# Patient Record
Sex: Male | Born: 1955 | Race: White | Hispanic: No | Marital: Married | Smoking: Former smoker
Health system: Southern US, Community
[De-identification: ages and names within clinical notes are randomized; demographics above are authoritative.]

## PROBLEM LIST (undated history)

## (undated) DIAGNOSIS — N400 Enlarged prostate without lower urinary tract symptoms: Secondary | ICD-10-CM

## (undated) DIAGNOSIS — T8859XA Other complications of anesthesia, initial encounter: Secondary | ICD-10-CM

## (undated) DIAGNOSIS — J449 Chronic obstructive pulmonary disease, unspecified: Secondary | ICD-10-CM

## (undated) DIAGNOSIS — I10 Essential (primary) hypertension: Secondary | ICD-10-CM

## (undated) DIAGNOSIS — N289 Disorder of kidney and ureter, unspecified: Secondary | ICD-10-CM

## (undated) DIAGNOSIS — R911 Solitary pulmonary nodule: Secondary | ICD-10-CM

## (undated) DIAGNOSIS — E785 Hyperlipidemia, unspecified: Secondary | ICD-10-CM

## (undated) DIAGNOSIS — I639 Cerebral infarction, unspecified: Secondary | ICD-10-CM

## (undated) HISTORY — PX: OTHER SURGICAL HISTORY: SHX169

## (undated) HISTORY — DX: Hyperlipidemia, unspecified: E78.5

## (undated) HISTORY — DX: Chronic obstructive pulmonary disease, unspecified: J44.9

## (undated) HISTORY — PX: NO PAST SURGERIES: SHX2092

## (undated) HISTORY — DX: Solitary pulmonary nodule: R91.1

## (undated) HISTORY — DX: Benign prostatic hyperplasia without lower urinary tract symptoms: N40.0

## (undated) HISTORY — PX: CERVICAL FUSION: SHX112

---

## 2009-05-07 ENCOUNTER — Encounter (INDEPENDENT_AMBULATORY_CARE_PROVIDER_SITE_OTHER): Payer: Self-pay | Admitting: *Deleted

## 2010-12-02 NOTE — Letter (Signed)
Summary: Pre Visit No Show Letter  [facility]  [address] [place], [place] [address]   Phone: [phone]  Fax: [phone]        May 07, 2009 MRN: [id number]    ARZELL MCGEEHAN [address] [place], [place]  [address]    Dear Mr. OATIS,   We have been unable to reach you by phone concerning the pre-procedure visit that you missed on 05-07-2009. For this reason,your procedure scheduled on 05-20-2009 has been cancelled. Our scheduling staff will gladly assist you with rescheduling your appointments at a more convenient time. Please call our office at [phone] between the hours of 8:00am and 5:00pm, press option #2 to reach an appointment scheduler. Please consider updating your contact numbers at this time so that we can reach you by phone in the future with schedule changes or results.    Thank you,    Ezra Sites RN [facility]

## 2011-11-26 ENCOUNTER — Other Ambulatory Visit: Payer: Self-pay | Admitting: Family Medicine

## 2011-11-26 DIAGNOSIS — R053 Chronic cough: Secondary | ICD-10-CM

## 2011-11-26 DIAGNOSIS — R05 Cough: Secondary | ICD-10-CM

## 2011-11-30 ENCOUNTER — Ambulatory Visit
Admission: RE | Admit: 2011-11-30 | Discharge: 2011-11-30 | Disposition: A | Discharge status: Home / Self Care | Payer: BC Managed Care – PPO | Source: Ambulatory Visit | Attending: Family Medicine | Admitting: Family Medicine

## 2011-11-30 DIAGNOSIS — R053 Chronic cough: Secondary | ICD-10-CM

## 2011-11-30 DIAGNOSIS — R05 Cough: Secondary | ICD-10-CM

## 2011-12-08 ENCOUNTER — Encounter: Payer: Self-pay | Admitting: Internal Medicine

## 2011-12-08 ENCOUNTER — Ambulatory Visit (INDEPENDENT_AMBULATORY_CARE_PROVIDER_SITE_OTHER): Payer: BC Managed Care – PPO | Admitting: Internal Medicine

## 2011-12-08 DIAGNOSIS — F17200 Nicotine dependence, unspecified, uncomplicated: Secondary | ICD-10-CM | POA: Insufficient documentation

## 2011-12-08 DIAGNOSIS — J449 Chronic obstructive pulmonary disease, unspecified: Secondary | ICD-10-CM | POA: Insufficient documentation

## 2011-12-08 DIAGNOSIS — R918 Other nonspecific abnormal finding of lung field: Secondary | ICD-10-CM

## 2011-12-08 NOTE — Patient Instructions (Signed)
We will need to get you scheduled for PFT's in [place] if at all possible before you go to [place]  You will need a repeat CT chest in 6 months to follow up the nodules unless new symptoms like new chest pain, coughing up blood or short of breath in which cxr needs to be done  Work on perfecting  inhaler technique:  relax and gently blow all the way out then take a nice smooth deep breath back in, triggering the inhaler at same time you start breathing in.  Hold for up to 5 seconds if you can.  Rinse and gargle with water when done   If your mouth or throat starts to bother you,   I suggest you time the inhaler to your dental care and after using the inhaler(s) brush teeth and tongue with a baking soda containing toothpaste and when you rinse this out, gargle with it first to see if this helps your mouth and throat.     You need to stop smoking before smoking stops you - remember the Fletcher curve.

## 2011-12-08 NOTE — Assessment & Plan Note (Addendum)
>   3 min discussion  I reviewed the Flethcher curve with patient that basically indicates  if you quit smoking when your best day FEV1 is still well preserved (which his appears to be) it is highly unlikely you will progress to severe disease and informed the patient there was no medication on the market that has proven to change the curve or the likelihood of progression.  Therefore stopping smoking and maintaining abstinence is the most important aspect of care, not choice of inhalers or for that matter, doctors.   

## 2011-12-08 NOTE — Assessment & Plan Note (Signed)
Clinically relatively mild with a significant ab component suggested by response to dulera  The proper method of use, as well as anticipated side effects, of this metered-dose inhaler are discussed and demonstrated to the patient. Improved to 75% with coaching.  Needs pft's  Smoking cessation discussed separately

## 2011-12-08 NOTE — Assessment & Plan Note (Signed)
Although there are clearly abnormalities on CT scan, they should probably be considered "microscopic" since not obvious on plain cxr .     In the setting of obvious "macroscopic" health issues,  I am very reluctatnt to embark on an invasive w/u at this point but will arrange consevative  follow up and in the meantime see what we can do to address the patient's subjective concerns.    Discussed in detail all the  indications, usual  risks and alternatives  relative to the benefits with patient and wife who understands we can't be 100% sure these are all benign with removing them all now, which is not feasible.  Rec f/u CT as per guidelines unless new symptoms or macroscopic change on plain cxr in interim

## 2011-12-08 NOTE — Progress Notes (Signed)
  Subjective:    Patient ID: Daniel [name], male    DOB: Feb 17, 1956  MRN: [id number]  HPI  56 yowm active smoker with new cough in late 2012 referred by Dr Christell Constant to the pulmonary [facility] pulmonary office for abn ct  12/08/2011 1st pulmonary eval with h/o asthma as child and felt outgrew it after age 56 but noted onset about 2 years prior to OV  cc sob "like asthma" requiring some use saba but not daily then increase cough and sob x 3-6 months > productive of clear mucus slt yellow or green but mostly clear and better since started dulera though not using consistently at all.  No hemoptysis. Doe x heavy exertion only, not with adls.   Sleeping ok without nocturnal  or early am exacerbation  of respiratory  c/o's or need for noct saba. Also denies any obvious fluctuation of symptoms with weather or environmental changes or other aggravating or alleviating factors except as outlined above         Fm Hx Pos Lung Ca mother  Review of Systems  Constitutional: Negative for fever and unexpected weight change.  HENT: Positive for sore throat and dental problem. Negative for ear pain, nosebleeds, congestion, rhinorrhea, sneezing, trouble swallowing, postnasal drip and sinus pressure.   Eyes: Negative for redness and itching.  Respiratory: Positive for cough and shortness of breath. Negative for chest tightness and wheezing.   Cardiovascular: Negative for palpitations and leg swelling.  Gastrointestinal: Negative for nausea and vomiting.  Genitourinary: Negative for dysuria.  Musculoskeletal: Positive for joint swelling.  Skin: Negative for rash.  Neurological: Negative for headaches.  Hematological: Does not bruise/bleed easily.  Psychiatric/Behavioral: Negative for dysphoric mood. The patient is not nervous/anxious.        Objective:   Physical Exam 12/08/2011 180  HEENT mild turbinate edema.  Oropharynx no thrush or excess pnd or cobblestoning.  No JVD or cervical adenopathy. Mild  accessory muscle hypertrophy. Trachea midline, nl thryroid. Chest was hyperinflated by percussion with diminished breath sounds and mild increased exp time without wheeze. Hoover sign positive at end inspiration. Regular rate and rhythm without murmur gallop or rub or increase P2 or edema.  Abd: no hsm, nl excursion. Ext warm without cyanosis or clubbing.     Ct chest 11/30/11 Numerous small pulmonary nodules scattered throughout the lungs  bilaterally, largest of which is 7 mm in the right upper lobe, as  above.        Assessment & Plan:

## 2011-12-11 ENCOUNTER — Ambulatory Visit (INDEPENDENT_AMBULATORY_CARE_PROVIDER_SITE_OTHER): Payer: BC Managed Care – PPO | Admitting: Physician Assistant

## 2011-12-11 DIAGNOSIS — Z0000 Encounter for general adult medical examination without abnormal findings: Secondary | ICD-10-CM

## 2011-12-11 NOTE — Progress Notes (Signed)
   Daniel [name] is a 56 y.o. male smoker with no h/o DM2 or HTN or FHx CAD who is going to [place] for 2 years.  Seen by PCP and wanted him to have a ETT prior to going.  Exercise Treadmill Test  Pre-Exercise Testing Evaluation Rhythm: normal sinus  Rate: 66   PR:  .11 QRS:  .08  QT:  .39 QTc: .41     Test  Exercise Tolerance Test Ordering MD: Rudi Heap MD  Interpreting MD:  Tereso Newcomer PA-C  Unique Test No: 1  Treadmill:  1  Indication for ETT: Physical  Contraindication to ETT: No   Stress Modality: exercise - treadmill  Cardiac Imaging Performed: non   Protocol: standard Bruce - maximal  Max BP:  165/84  Max MPHR (bpm):  165 85% MPR (bpm):  140  MPHR obtained (bpm):  162 % MPHR obtained:  97%  Reached 85% MPHR (min:sec):  6:25 Total Exercise Time (min-sec):  7:43  Workload in METS:  9.6 Borg Scale: 15  Reason ETT Terminated:  patient's desire to stop    ST Segment Analysis At Rest: normal ST segments - no evidence of significant ST depression With Exercise: no evidence of significant ST depression  Other Information Arrhythmia:  No Angina during ETT:  absent (0) Quality of ETT:  diagnostic  ETT Interpretation:  normal - no evidence of ischemia by ST analysis  Comments: Fair exercise tolerance. No chest pain. Normal BP response to exercise. No ST-T changes to suggest ischemia.   Recommendations: Follow up with PCP as directed. Tereso Newcomer, PA-C  11:46 AM 12/11/2011

## 2012-01-26 ENCOUNTER — Encounter: Payer: Self-pay | Admitting: Internal Medicine

## 2012-01-29 ENCOUNTER — Encounter: Payer: Self-pay | Admitting: Internal Medicine

## 2012-06-21 ENCOUNTER — Telehealth: Payer: Self-pay | Admitting: *Deleted

## 2012-06-21 DIAGNOSIS — R918 Other nonspecific abnormal finding of lung field: Secondary | ICD-10-CM

## 2012-06-21 NOTE — Telephone Encounter (Signed)
Message copied by Christen Butter on Tue Jun 21, 2012 12:12 PM ------      Message from: Nyoka Cowden      Created: Tue Dec 08, 2011  9:44 PM       Needs repeat ct chest this month

## 2012-06-21 NOTE — Telephone Encounter (Signed)
Needs ct chest this month LMTCB

## 2012-06-23 NOTE — Telephone Encounter (Signed)
Spoke with pt's spouse and she is aware I am sending order to [facility] to schedule ct chest. She denied any questions/concerns. I advised will call the pt with results once reviewed.

## 2012-06-27 ENCOUNTER — Encounter: Payer: Self-pay | Admitting: Internal Medicine

## 2012-06-27 ENCOUNTER — Ambulatory Visit (INDEPENDENT_AMBULATORY_CARE_PROVIDER_SITE_OTHER)
Admission: RE | Admit: 2012-06-27 | Discharge: 2012-06-27 | Disposition: A | Discharge status: Home / Self Care | Payer: BC Managed Care – PPO | Source: Ambulatory Visit | Attending: Internal Medicine | Admitting: Internal Medicine

## 2012-06-27 DIAGNOSIS — R918 Other nonspecific abnormal finding of lung field: Secondary | ICD-10-CM

## 2012-06-28 ENCOUNTER — Telehealth: Payer: Self-pay | Admitting: Internal Medicine

## 2012-06-28 NOTE — Telephone Encounter (Signed)
Notes Recorded by Nyoka Cowden, MD on 06/27/2012 at 4:20 PM Call patient : Study is unremarkable, no change size of nodules, radiology rec one more study one year.  I spoke with patient about results and he verbalized understanding and had no questions

## 2013-06-06 ENCOUNTER — Telehealth: Payer: Self-pay | Admitting: *Deleted

## 2013-06-06 NOTE — Telephone Encounter (Signed)
Message copied by Christen Butter on Tue Jun 06, 2013 11:22 AM ------      Message from: Sandrea Hughs B      Created: Mon Jun 27, 2012  4:20 PM       Needs repeat ct this month ------

## 2013-06-06 NOTE — Telephone Encounter (Signed)
LMTCB

## 2013-06-09 NOTE — Telephone Encounter (Signed)
Just re-enter it for recall 08/02/13

## 2013-06-09 NOTE — Telephone Encounter (Signed)
Spoke with the pt's spouse She states that the pt is currently in [place] and he is probably will not be home until after Sept 4th I asked if we could go ahead and get this set up for late Sept, but she prefers to wait until the pt returns Will forward to MW so that she is aware

## 2013-06-12 NOTE — Telephone Encounter (Signed)
Done-reminder was sent

## 2013-07-18 ENCOUNTER — Other Ambulatory Visit: Payer: Self-pay | Admitting: *Deleted

## 2013-07-18 ENCOUNTER — Other Ambulatory Visit (INDEPENDENT_AMBULATORY_CARE_PROVIDER_SITE_OTHER): Payer: BC Managed Care – PPO

## 2013-07-18 DIAGNOSIS — E785 Hyperlipidemia, unspecified: Secondary | ICD-10-CM

## 2013-07-18 DIAGNOSIS — R5383 Other fatigue: Secondary | ICD-10-CM

## 2013-07-18 DIAGNOSIS — E559 Vitamin D deficiency, unspecified: Secondary | ICD-10-CM

## 2013-07-18 DIAGNOSIS — R5381 Other malaise: Secondary | ICD-10-CM

## 2013-07-18 DIAGNOSIS — N400 Enlarged prostate without lower urinary tract symptoms: Secondary | ICD-10-CM

## 2013-07-18 DIAGNOSIS — R918 Other nonspecific abnormal finding of lung field: Secondary | ICD-10-CM

## 2013-07-18 LAB — POCT CBC
Granulocyte percent: 71.8 %G (ref 37–80)
HCT, POC: 48.4 % (ref 43.5–53.7)
Hemoglobin: 16 g/dL (ref 14.1–18.1)
Lymph, poc: 1.6 (ref 0.6–3.4)
MCH, POC: 31.9 pg — AB (ref 27–31.2)
MCHC: 33.2 g/dL (ref 31.8–35.4)
MCV: 96.1 fL (ref 80–97)
MPV: 10.6 fL (ref 0–99.8)
POC Granulocyte: 4.5 (ref 2–6.9)
POC LYMPH PERCENT: 26.3 %L (ref 10–50)
Platelet Count, POC: 133 10*3/uL — AB (ref 142–424)
RBC: 5 M/uL (ref 4.69–6.13)
RDW, POC: 13.6 %
WBC: 6.2 10*3/uL (ref 4.6–10.2)

## 2013-07-18 LAB — POCT URINALYSIS DIPSTICK
Bilirubin, UA: NEGATIVE
Blood, UA: NEGATIVE
Glucose, UA: NEGATIVE
Ketones, UA: NEGATIVE
Leukocytes, UA: NEGATIVE
Nitrite, UA: NEGATIVE
Protein, UA: NEGATIVE
Spec Grav, UA: 1.015
Urobilinogen, UA: NEGATIVE
pH, UA: 5

## 2013-07-18 LAB — POCT UA - MICROSCOPIC ONLY
Bacteria, U Microscopic: NEGATIVE
Casts, Ur, LPF, POC: NEGATIVE
Crystals, Ur, HPF, POC: NEGATIVE
Mucus, UA: NEGATIVE
RBC, urine, microscopic: NEGATIVE
WBC, Ur, HPF, POC: NEGATIVE
Yeast, UA: NEGATIVE

## 2013-07-18 NOTE — Progress Notes (Signed)
Pt came in for labs only 

## 2013-07-20 ENCOUNTER — Ambulatory Visit (INDEPENDENT_AMBULATORY_CARE_PROVIDER_SITE_OTHER): Payer: BC Managed Care – PPO | Admitting: Family Medicine

## 2013-07-20 ENCOUNTER — Encounter: Payer: Self-pay | Admitting: Family Medicine

## 2013-07-20 VITALS — BP 98/64 | HR 71 | Temp 98.4°F | Ht 70.5 in | Wt 154.0 lb

## 2013-07-20 DIAGNOSIS — J449 Chronic obstructive pulmonary disease, unspecified: Secondary | ICD-10-CM

## 2013-07-20 DIAGNOSIS — N400 Enlarged prostate without lower urinary tract symptoms: Secondary | ICD-10-CM

## 2013-07-20 DIAGNOSIS — E785 Hyperlipidemia, unspecified: Secondary | ICD-10-CM

## 2013-07-20 DIAGNOSIS — R9389 Abnormal findings on diagnostic imaging of other specified body structures: Secondary | ICD-10-CM

## 2013-07-20 LAB — NMR, LIPOPROFILE
Cholesterol: 213 mg/dL — ABNORMAL HIGH (ref <200)
HDL Cholesterol by NMR: 65 mg/dL (ref >=40)
HDL Particle Number: 38.2 umol/L (ref >=30.5)
LDL Particle Number: 1886 nmol/L — ABNORMAL HIGH (ref <1000)
LDL Size: 20.9 nm (ref >20.5)
LDLC SERPL CALC-MCNC: 133 mg/dL — ABNORMAL HIGH (ref <100)
LP-IR Score: <25 (ref <=45)
Small LDL Particle Number: 727 nmol/L — ABNORMAL HIGH (ref <=527)
Triglycerides by NMR: 74 mg/dL (ref <150)

## 2013-07-20 LAB — THYROID PANEL WITH TSH
Free Thyroxine Index: 2 (ref 1.2–4.9)
T3 Uptake Ratio: 28 % (ref 24–39)
T4, Total: 7.1 ug/dL (ref 4.5–12.0)
TSH: 3.73 u[IU]/mL (ref 0.450–4.500)

## 2013-07-20 LAB — BMP8+EGFR
BUN/Creatinine Ratio: 14 (ref 9–20)
BUN: 13 mg/dL (ref 6–24)
CO2: 24 mmol/L (ref 18–29)
Calcium: 9 mg/dL (ref 8.7–10.2)
Chloride: 100 mmol/L (ref 97–108)
Creatinine, Ser: 0.93 mg/dL (ref 0.76–1.27)
GFR calc Af Amer: 105 mL/min/{1.73_m2} (ref >59)
GFR calc non Af Amer: 91 mL/min/{1.73_m2} (ref >59)
Glucose: 95 mg/dL (ref 65–99)
Potassium: 4.7 mmol/L (ref 3.5–5.2)
Sodium: 140 mmol/L (ref 134–144)

## 2013-07-20 LAB — HEPATIC FUNCTION PANEL
ALT: 14 IU/L (ref 0–44)
AST: 19 IU/L (ref 0–40)
Albumin: 4.5 g/dL (ref 3.5–5.5)
Alkaline Phosphatase: 72 IU/L (ref 39–117)
Bilirubin, Direct: 0.16 mg/dL (ref 0.00–0.40)
Total Bilirubin: 0.7 mg/dL (ref 0.0–1.2)
Total Protein: 6.6 g/dL (ref 6.0–8.5)

## 2013-07-20 LAB — VITAMIN D 25 HYDROXY (VIT D DEFICIENCY, FRACTURES): Vit D, 25-Hydroxy: 33.5 ng/mL (ref 30.0–100.0)

## 2013-07-20 LAB — PSA, TOTAL AND FREE
PSA, Free Pct: 40 %
PSA, Free: 0.08 ng/mL
PSA: 0.2 ng/mL (ref 0.0–4.0)

## 2013-07-20 NOTE — Patient Instructions (Addendum)
Continue current medications. Continue good therapeutic lifestyle changes.  Fall precautions discussed with patient.  Schedule your flu vaccine the first of October.  Follow up as planned and earlier as needed.  Take vitamin D regularly  Get CT scan as planned end of October or the first of November

## 2013-07-20 NOTE — Progress Notes (Signed)
Subjective:    Patient ID: Daniel [name], male    DOB: July 31, 1956, 57 y.o.   MRN: [id number]  HPI PT HERE TODAY FOR MALE CPE The patient had recent lab work and everything was actually pretty good on the lab work except for his total LDL particle number being elevated at 1886. His vitamin D level was at the low end of the normal range. The patient has a history of an abnormal chest CT and this is supposed to be repeated sometime toward the end of October .    Patient Active Problem List   Diagnosis Date Noted  . COPD (chronic obstructive pulmonary disease) 12/08/2011  . Smoker 12/08/2011  . Multiple pulmonary nodules 12/08/2011   Outpatient Encounter Prescriptions as of 07/20/2013  Medication Sig Dispense Refill  . Mometasone Furo-Formoterol Fum (DULERA) 200-5 MCG/ACT AERO Inhale 2 puffs into the lungs 2 (two) times daily.      . Vitamin D, Ergocalciferol, (DRISDOL) 50000 UNITS CAPS Take 1 tablet by mouth Once a week.      . [DISCONTINUED] sulfamethoxazole-trimethoprim (BACTRIM DS) 800-160 MG per tablet Take 1 tablet by mouth Twice daily.       No facility-administered encounter medications on file as of 07/20/2013.       Review of Systems  Constitutional: Negative.   HENT: Negative.   Eyes: Negative.   Respiratory: Negative.   Cardiovascular: Negative.   Gastrointestinal: Negative.   Endocrine: Negative.   Genitourinary: Negative.   Musculoskeletal: Positive for myalgias (CRAMPS- WORSE AT NIGHT) and arthralgias (LEFT KNEE PAIN).  Skin: Negative.   Allergic/Immunologic: Negative.   Neurological: Positive for dizziness.  Hematological: Negative.   Psychiatric/Behavioral: Negative.        Objective:   Physical Exam  Nursing note and vitals reviewed. Constitutional: He is oriented to person, place, and time. He appears well-developed and well-nourished. No distress.  HENT:  Head: Normocephalic and atraumatic.  Right Ear: External ear normal.  Left Ear: External  ear normal.  Nose: Nose normal.  Mouth/Throat: Oropharynx is clear and moist. No oropharyngeal exudate.  Eyes: Conjunctivae and EOM are normal. Pupils are equal, round, and reactive to light. Right eye exhibits no discharge. Left eye exhibits no discharge. No scleral icterus.  Neck: Normal range of motion. Neck supple. No thyromegaly present.  Cardiovascular: Normal rate, regular rhythm, normal heart sounds and intact distal pulses.   No murmur heard. At 72 per minute  Pulmonary/Chest: Effort normal and breath sounds normal. No respiratory distress. He has no wheezes. He has no rales.  Dry cough is getting better  Abdominal: Soft. Bowel sounds are normal. He exhibits no mass. There is no tenderness. There is no rebound and no guarding.  Genitourinary: Rectum normal, prostate normal and penis normal.  Ostitis slightly enlarged but no rubs. There are no rectal masses. There is no inguinal hernia bilaterally.  Musculoskeletal: Normal range of motion. He exhibits no edema and no tenderness.  Lymphadenopathy:    He has no cervical adenopathy.  Neurological: He is alert and oriented to person, place, and time. He has normal reflexes.  Skin: Skin is warm and dry. No rash noted. He is not diaphoretic. No erythema. No pallor.  Psychiatric: He has a normal mood and affect. His behavior is normal. Judgment and thought content normal.   BP 98/64  Pulse 71  Temp(Src) 98.4 F (36.9 C) (Oral)  Ht 5' 10.5" (1.791 m)  Wt 154 lb (69.854 kg)  BMI 21.78 kg/m2  Assessment & Plan:   1. COPD (chronic obstructive pulmonary disease)   2. BPH (benign prostatic hyperplasia)   3. Abnormal chest CT   4. Hyperlipidemia    No orders of the defined types were placed in this encounter.   Patient Instructions  Continue current medications. Continue good therapeutic lifestyle changes.  Fall precautions discussed with patient.  Schedule your flu vaccine the first of October.  Follow up as planned  and earlier as needed.     Stop smoking Get CT scan as planned the end of October See pulmonologist after CT scan  Nyra Capes MD

## 2013-07-21 ENCOUNTER — Other Ambulatory Visit: Payer: Self-pay | Admitting: *Deleted

## 2013-07-21 MED ORDER — MOMETASONE FURO-FORMOTEROL FUM 200-5 MCG/ACT IN AERO: 2.0000 | INHALATION_SPRAY | Freq: Two times a day (BID) | RESPIRATORY_TRACT | Status: DC

## 2013-07-21 MED ORDER — BETAMETHASONE DIPROPIONATE AUG 0.05 % EX OINT: TOPICAL_OINTMENT | Freq: Two times a day (BID) | CUTANEOUS | Status: DC

## 2013-08-01 ENCOUNTER — Telehealth: Payer: Self-pay | Admitting: *Deleted

## 2013-08-01 DIAGNOSIS — R918 Other nonspecific abnormal finding of lung field: Secondary | ICD-10-CM

## 2013-08-01 NOTE — Telephone Encounter (Signed)
Called and spoke with the pt He is aware that the CT Chest is needed Order was sent to [facility] for it to be done this wk, since he is leaving and going back to [place]

## 2013-08-03 ENCOUNTER — Ambulatory Visit (INDEPENDENT_AMBULATORY_CARE_PROVIDER_SITE_OTHER)
Admission: RE | Admit: 2013-08-03 | Discharge: 2013-08-03 | Disposition: A | Discharge status: Home / Self Care | Payer: BC Managed Care – PPO | Source: Ambulatory Visit | Attending: Internal Medicine | Admitting: Internal Medicine

## 2013-08-03 ENCOUNTER — Encounter: Payer: Self-pay | Admitting: Internal Medicine

## 2013-08-03 DIAGNOSIS — R918 Other nonspecific abnormal finding of lung field: Secondary | ICD-10-CM

## 2013-08-03 NOTE — Progress Notes (Signed)
Quick Note:  Spoke with pt and notified of results per Dr. Wert. Pt verbalized understanding and denied any questions.  ______ 

## 2014-05-08 ENCOUNTER — Encounter: Payer: Self-pay | Admitting: Family Medicine

## 2014-05-08 ENCOUNTER — Ambulatory Visit (INDEPENDENT_AMBULATORY_CARE_PROVIDER_SITE_OTHER): Payer: BC Managed Care – PPO | Admitting: Family Medicine

## 2014-05-08 VITALS — BP 92/57 | HR 74 | Temp 98.6°F | Ht 70.5 in | Wt 172.2 lb

## 2014-05-08 DIAGNOSIS — L02419 Cutaneous abscess of limb, unspecified: Secondary | ICD-10-CM

## 2014-05-08 DIAGNOSIS — W57XXXA Bitten or stung by nonvenomous insect and other nonvenomous arthropods, initial encounter: Secondary | ICD-10-CM

## 2014-05-08 DIAGNOSIS — L03119 Cellulitis of unspecified part of limb: Secondary | ICD-10-CM

## 2014-05-08 DIAGNOSIS — R9389 Abnormal findings on diagnostic imaging of other specified body structures: Secondary | ICD-10-CM

## 2014-05-08 DIAGNOSIS — T148 Other injury of unspecified body region: Secondary | ICD-10-CM

## 2014-05-08 MED ORDER — DOXYCYCLINE HYCLATE 100 MG PO TABS: 100.0000 mg | ORAL_TABLET | Freq: Two times a day (BID) | ORAL | Status: DC

## 2014-05-08 NOTE — Addendum Note (Signed)
Addended by: Selmer Dominion on: 05/08/2014 05:55 PM   Modules accepted: Orders

## 2014-05-08 NOTE — Progress Notes (Signed)
   Subjective:    Patient ID: Daniel [name], male    DOB: Nov 30, 1955, 58 y.o.   MRN: [id number]  HPI Pt is being seen today for insect bite to upper Left thigh. This bite reaction has been there for more than a week. He removed a small tick from his right lower abdomen above the heat up this morning and there is a tick bite site there also. The patient has a history of an abnormal chest CT with pulmonary nodules. We will check and find out when the next chest CT is to be done.   Review of Systems  Constitutional: Negative for fever.  Skin: Positive for wound (Left upper thigh, noticed 3-4 days ago, redness with itching).  Neurological: Positive for headaches (occasional).       Objective:   Physical Exam  Nursing note and vitals reviewed. Constitutional: He is oriented to person, place, and time. He appears well-developed and well-nourished. No distress.  HENT:  Head: Normocephalic and atraumatic.  Eyes: Conjunctivae and EOM are normal. Pupils are equal, round, and reactive to light. Right eye exhibits no discharge. Left eye exhibits no discharge. No scleral icterus.  Neck: Normal range of motion. Neck supple. No thyromegaly present.  Cardiovascular: Normal rate, regular rhythm, normal heart sounds and intact distal pulses.  Exam reveals no gallop and no friction rub.   No murmur heard. Pulmonary/Chest: Effort normal and breath sounds normal. No respiratory distress. He has no wheezes. He has no rales. He exhibits no tenderness.  Slightly congested cough  Abdominal: Soft. Bowel sounds are normal. He exhibits no mass.  Musculoskeletal: Normal range of motion. He exhibits no edema.  Lymphadenopathy:    He has no cervical adenopathy.  Neurological: He is alert and oriented to person, place, and time.  Skin: Skin is warm and dry. Rash noted. There is erythema. No pallor.  Large area of erythema and bruising left upper thigh moving to left medial thigh. There is also an area above  the right thigh and the right lower abdomen but this one is smaller than the left.  Psychiatric: He has a normal mood and affect. His behavior is normal. Judgment and thought content normal.   BP 92/57  Pulse 74  Temp(Src) 98.6 F (37 C) (Oral)  Ht 5' 10.5" (1.791 m)  Wt 172 lb 3.2 oz (78.109 kg)  BMI 24.35 kg/m2        Assessment & Plan:  1. Tick bites - doxycycline (VIBRA-TABS) 100 MG tablet; Take 1 tablet (100 mg total) by mouth 2 (two) times daily.  Dispense: 28 tablet; Refill: 0 - POCT CBC - Rocky mtn spotted fvr abs pnl(IgG+IgM) - Lyme Ab/Western Blot Reflex  2. Cellulitis of lower extremity, unspecified laterality - doxycycline (VIBRA-TABS) 100 MG tablet; Take 1 tablet (100 mg total) by mouth 2 (two) times daily.  Dispense: 28 tablet; Refill: 0  3. Abnormal chest CT  Patient Instructions  Take antibiotic as directed Return to clinic in 2 weeks for recheck Or sooner if needed Please followup on getting your next chest CT at the appropriate time   The patient's CT scan is past due and this will be scheduled at the visit today.  Arrie Senate MD

## 2014-05-08 NOTE — Patient Instructions (Signed)
Take antibiotic as directed Return to clinic in 2 weeks for recheck Or sooner if needed Please followup on getting your next chest CT at the appropriate time

## 2014-05-09 LAB — CBC WITH DIFFERENTIAL
Basophils Absolute: 0 10*3/uL (ref 0.0–0.2)
Basos: 1 %
Eos: 2 %
Eosinophils Absolute: 0.1 10*3/uL (ref 0.0–0.4)
HCT: 44.1 % (ref 37.5–51.0)
Hemoglobin: 16 g/dL (ref 12.6–17.7)
Immature Grans (Abs): 0 10*3/uL (ref 0.0–0.1)
Immature Granulocytes: 0 %
Lymphocytes Absolute: 1.9 10*3/uL (ref 0.7–3.1)
Lymphs: 31 %
MCH: 33.8 pg — ABNORMAL HIGH (ref 26.6–33.0)
MCHC: 36.3 g/dL — ABNORMAL HIGH (ref 31.5–35.7)
MCV: 93 fL (ref 79–97)
Monocytes Absolute: 0.4 10*3/uL (ref 0.1–0.9)
Monocytes: 6 %
Neutrophils Absolute: 3.7 10*3/uL (ref 1.4–7.0)
Neutrophils Relative %: 60 %
Platelets: 183 10*3/uL (ref 150–379)
RBC: 4.74 x10E6/uL (ref 4.14–5.80)
RDW: 13.9 % (ref 12.3–15.4)
WBC: 6.2 10*3/uL (ref 3.4–10.8)

## 2014-05-10 LAB — LYME AB/WESTERN BLOT REFLEX
LYME DISEASE AB, QUANT, IGM: <0.8 index (ref 0.00–0.79)
Lyme IgG/IgM Ab: <0.91 {ISR} (ref 0.00–0.90)

## 2014-05-10 LAB — ROCKY MTN SPOTTED FVR ABS PNL(IGG+IGM)
RMSF IgG: POSITIVE — AB
RMSF IgM: 0.46 index (ref 0.00–0.89)

## 2014-05-10 LAB — RMSF, IGG, IFA: RMSF, IGG, IFA: 1:64 {titer} — ABNORMAL HIGH

## 2014-05-11 ENCOUNTER — Ambulatory Visit (HOSPITAL_COMMUNITY)
Admission: RE | Admit: 2014-05-11 | Discharge: 2014-05-11 | Disposition: A | Discharge status: Home / Self Care | Payer: BC Managed Care – PPO | Source: Ambulatory Visit | Attending: Family Medicine | Admitting: Family Medicine

## 2014-05-11 ENCOUNTER — Encounter (HOSPITAL_COMMUNITY): Payer: Self-pay

## 2014-05-11 DIAGNOSIS — R918 Other nonspecific abnormal finding of lung field: Secondary | ICD-10-CM | POA: Insufficient documentation

## 2014-05-11 DIAGNOSIS — R9389 Abnormal findings on diagnostic imaging of other specified body structures: Secondary | ICD-10-CM

## 2014-05-15 ENCOUNTER — Telehealth: Payer: Self-pay

## 2014-05-15 NOTE — Telephone Encounter (Signed)
Pt aware of CT results. 

## 2014-05-15 NOTE — Telephone Encounter (Signed)
Message copied by Koren Bound on Tue May 15, 2014 12:18 PM ------      Message from: Chipper Herb      Created: Sat May 12, 2014  9:01 AM       As per radiology report-----please call patient and his wife with these results. Just because there is no cancer, does not mean there is not a reason to stop smoking. ------

## 2015-02-07 ENCOUNTER — Encounter: Payer: Self-pay | Admitting: Family Medicine

## 2015-02-07 ENCOUNTER — Ambulatory Visit (INDEPENDENT_AMBULATORY_CARE_PROVIDER_SITE_OTHER): Payer: BLUE CROSS/BLUE SHIELD | Admitting: Family Medicine

## 2015-02-07 VITALS — BP 105/70 | HR 66 | Temp 98.4°F | Ht 70.5 in | Wt 174.0 lb

## 2015-02-07 DIAGNOSIS — W57XXXA Bitten or stung by nonvenomous insect and other nonvenomous arthropods, initial encounter: Secondary | ICD-10-CM

## 2015-02-07 DIAGNOSIS — M25571 Pain in right ankle and joints of right foot: Secondary | ICD-10-CM

## 2015-02-07 DIAGNOSIS — M25572 Pain in left ankle and joints of left foot: Secondary | ICD-10-CM | POA: Diagnosis not present

## 2015-02-07 DIAGNOSIS — L03314 Cellulitis of groin: Secondary | ICD-10-CM

## 2015-02-07 DIAGNOSIS — T148 Other injury of unspecified body region: Secondary | ICD-10-CM | POA: Diagnosis not present

## 2015-02-07 MED ORDER — DOXYCYCLINE HYCLATE 100 MG PO TABS: 100.0000 mg | ORAL_TABLET | Freq: Two times a day (BID) | ORAL | Status: DC

## 2015-02-07 NOTE — Patient Instructions (Signed)
Wear good shoes with good support Take ibuprofen twice daily after breakfast and supper 600 800 mg. Take antibiotic as directed We will call you with the x-ray results of the ankles and with the lab work results as soon as those results become available

## 2015-02-07 NOTE — Progress Notes (Signed)
Subjective:    Patient ID: Daniel [name], male    DOB: 03-02-1956, 59 y.o.   MRN: [id number]  HPI Patient here today for a sore/ insect bite in groin area that appears to be infected. The area in the right groin has been irritated for 2 weeks. He did not actually see a tick. He is also having bilateral ankle pain limits and going on for 2 months. 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 He was wearing sandals.        Patient Active Problem List   Diagnosis Date Noted  . BPH (benign prostatic hyperplasia) 07/20/2013  . Abnormal chest CT 07/20/2013  . Hyperlipidemia 07/20/2013  . COPD (chronic obstructive pulmonary disease) 12/08/2011  . Smoker 12/08/2011  . Multiple pulmonary nodules 12/08/2011   Outpatient Encounter Prescriptions as of 02/07/2015  Medication Sig  . mometasone-formoterol (DULERA) 200-5 MCG/ACT AERO Inhale 2 puffs into the lungs 2 (two) times daily.  . [DISCONTINUED] augmented betamethasone dipropionate (DIPROLENE) 0.05 % ointment Apply topically 2 (two) times daily.  . [DISCONTINUED] doxycycline (VIBRA-TABS) 100 MG tablet Take 1 tablet (100 mg total) by mouth 2 (two) times daily.    Review of Systems  Constitutional: Negative.   HENT: Negative.   Eyes: Negative.   Respiratory: Negative.   Cardiovascular: Negative.   Gastrointestinal: Negative.   Endocrine: Negative.   Genitourinary: Negative.   Musculoskeletal: Positive for arthralgias (bilateral ankle pain).  Skin: Negative.        Sore/ bite - right side groin   Allergic/Immunologic: Negative.   Neurological: Negative.   Hematological: Negative.   Psychiatric/Behavioral: Negative.        Objective:   Physical Exam  Constitutional: He is oriented to person, place, and time. He appears well-developed and well-nourished. No distress.  HENT:  Head: Normocephalic and atraumatic.  Eyes: Conjunctivae and EOM are normal. Pupils are equal, round, and  reactive to light. Right eye exhibits no discharge. Left eye exhibits no discharge. No scleral icterus.  Neck: Normal range of motion.  Abdominal: He exhibits no mass.  Musculoskeletal: Normal range of motion. He exhibits no edema or tenderness.  There was no tenderness swelling redness or rubor of either ankle. There is good mobility.  Neurological: He is alert and oriented to person, place, and time.  Skin: Skin is warm and dry. Rash noted. There is erythema. No pallor.  There is a bite wound with surrounding bruising and erythema of the right groin  Psychiatric: He has a normal mood and affect. His behavior is normal. Judgment and thought content normal.  Nursing note and vitals reviewed.   BP 105/70 mmHg  Pulse 66  Temp(Src) 98.4 F (36.9 C) (Oral)  Ht 5' 10.5" (1.791 m)  Wt 174 lb (78.926 kg)  BMI 24.61 kg/m2  [facility] reading (PRIMARY) by  Dr.Merleen Picazo-bilateral ankles  --- probable degenerative changes                                    Assessment & Plan:  1. Tick bites -Take antibiotic as directed - Rocky mtn spotted fvr abs pnl(IgG+IgM) - Lyme Ab/Western Blot Reflex - Uric acid - doxycycline (VIBRA-TABS) 100 MG tablet; Take 1 tablet (100 mg total) by mouth 2 (two) times daily.  Dispense: 28 tablet; Refill: 0  2. Bilateral ankle pain -We will check x-rays and get a uric acid  to further evaluate this -Patient will be instructed to take 600-800 mg of ibuprofen twice daily for the next couple weeks  3. Cellulitis of groin, right -Take antibiotic as directed  Patient Instructions  Wear good shoes with good support Take ibuprofen twice daily after breakfast and supper 600 800 mg. Take antibiotic as directed We will call you with the x-ray results of the ankles and with the lab work results as soon as those results become available   Arrie Senate MD

## 2015-02-11 LAB — LYME AB/WESTERN BLOT REFLEX
LYME DISEASE AB, QUANT, IGM: <0.8 index (ref 0.00–0.79)
Lyme IgG/IgM Ab: <0.91 {ISR} (ref 0.00–0.90)

## 2015-02-11 LAB — URIC ACID: Uric Acid: 5.4 mg/dL (ref 3.7–8.6)

## 2015-02-11 LAB — ROCKY MTN SPOTTED FVR ABS PNL(IGG+IGM)
RMSF IgG: POSITIVE — AB
RMSF IgM: 0.37 index (ref 0.00–0.89)

## 2015-02-11 LAB — RMSF, IGG, IFA: RMSF, IGG, IFA: <1:64 {titer}

## 2015-04-16 ENCOUNTER — Ambulatory Visit: Payer: BLUE CROSS/BLUE SHIELD | Admitting: Family Medicine

## 2015-04-19 ENCOUNTER — Other Ambulatory Visit: Payer: Self-pay | Admitting: *Deleted

## 2015-04-19 DIAGNOSIS — D171 Benign lipomatous neoplasm of skin and subcutaneous tissue of trunk: Secondary | ICD-10-CM

## 2015-04-23 ENCOUNTER — Other Ambulatory Visit: Payer: Self-pay | Admitting: *Deleted

## 2015-04-23 DIAGNOSIS — M25511 Pain in right shoulder: Secondary | ICD-10-CM

## 2015-04-27 ENCOUNTER — Other Ambulatory Visit: Payer: Self-pay | Admitting: *Deleted

## 2015-04-27 DIAGNOSIS — M25511 Pain in right shoulder: Secondary | ICD-10-CM

## 2015-04-27 NOTE — Progress Notes (Signed)
Pt calls in to [facility] and complains of right shoulder pain. This has been going on for sometime. We had ordered an MRI, but it is not approved by insurance. He will have a DG shoulder done in out office next week with hopes that insurance will then cover MRI if still needed  Pt called and aware of all today.

## 2015-04-30 ENCOUNTER — Other Ambulatory Visit (INDEPENDENT_AMBULATORY_CARE_PROVIDER_SITE_OTHER): Payer: BLUE CROSS/BLUE SHIELD

## 2015-04-30 DIAGNOSIS — M25511 Pain in right shoulder: Secondary | ICD-10-CM

## 2015-05-01 ENCOUNTER — Ambulatory Visit (HOSPITAL_COMMUNITY)
Admission: RE | Admit: 2015-05-01 | Discharge: 2015-05-01 | Disposition: A | Discharge status: Home / Self Care | Payer: BLUE CROSS/BLUE SHIELD | Source: Ambulatory Visit | Attending: Family Medicine | Admitting: Family Medicine

## 2015-05-01 DIAGNOSIS — D171 Benign lipomatous neoplasm of skin and subcutaneous tissue of trunk: Secondary | ICD-10-CM | POA: Diagnosis present

## 2015-05-02 ENCOUNTER — Other Ambulatory Visit: Payer: Self-pay | Admitting: *Deleted

## 2015-05-02 MED ORDER — MELOXICAM 7.5 MG PO TABS: 7.5000 mg | ORAL_TABLET | Freq: Every day | ORAL | Status: DC

## 2015-09-02 ENCOUNTER — Encounter: Payer: Self-pay | Admitting: Family Medicine

## 2015-09-02 ENCOUNTER — Ambulatory Visit (INDEPENDENT_AMBULATORY_CARE_PROVIDER_SITE_OTHER): Payer: 59

## 2015-09-02 ENCOUNTER — Ambulatory Visit (INDEPENDENT_AMBULATORY_CARE_PROVIDER_SITE_OTHER): Payer: 59 | Admitting: Family Medicine

## 2015-09-02 VITALS — BP 112/76 | HR 66 | Temp 97.7°F | Ht 70.5 in | Wt 179.6 lb

## 2015-09-02 DIAGNOSIS — M25511 Pain in right shoulder: Secondary | ICD-10-CM

## 2015-09-02 DIAGNOSIS — M25512 Pain in left shoulder: Secondary | ICD-10-CM | POA: Diagnosis not present

## 2015-09-02 NOTE — Progress Notes (Signed)
Subjective:    Patient ID: Daniel [name], male    DOB: 16-Aug-1956, 59 y.o.   MRN: [id number]  HPI Shoulder pain for 2-3 months. Patient had xray on 04/30/2015. He was given mobic and it has not helped. Patient states that the pain is radiating into the left shoulder now also. The patient has been working in [employer] for many years and uses his arms and upper torso for lifting and hammering etc. He says that in order to get comfortable and he has to position his shoulder properly. If you try to raise the shoulder or the arm upward the pain is severe in the right side. He prefers not to take any cortisone by mouth. He would rather try to find the cause of the pain and correct the cause. We reviewed with him today his past chest CT shoulder film and ultrasound and gave him copies of these to keep.   Review of Systems  Constitutional: Negative.   HENT: Negative.   Eyes: Negative.   Respiratory: Negative.   Cardiovascular: Negative.   Endocrine: Negative.   Genitourinary: Negative.   Musculoskeletal:       Bilateral Shoulder Pain   Skin: Negative.   Allergic/Immunologic: Negative.   Neurological: Negative.   Hematological: Negative.   Psychiatric/Behavioral: Negative.         Patient Active Problem List   Diagnosis Date Noted  . BPH (benign prostatic hyperplasia) 07/20/2013  . Abnormal chest CT 07/20/2013  . Hyperlipidemia 07/20/2013  . COPD (chronic obstructive pulmonary disease) ([place]) 12/08/2011  . Smoker 12/08/2011  . Multiple pulmonary nodules 12/08/2011   Outpatient Encounter Prescriptions as of 09/02/2015  Medication Sig  . mometasone-formoterol (DULERA) 200-5 MCG/ACT AERO Inhale 2 puffs into the lungs 2 (two) times daily.  . [DISCONTINUED] doxycycline (VIBRA-TABS) 100 MG tablet Take 1 tablet (100 mg total) by mouth 2 (two) times daily.  . [DISCONTINUED] meloxicam (MOBIC) 7.5 MG tablet Take 1 tablet (7.5 mg total) by mouth daily. (Patient not taking: Reported on  09/02/2015)   No facility-administered encounter medications on file as of 09/02/2015.       Objective:   Physical Exam  Constitutional: He is oriented to person, place, and time. He appears well-developed and well-nourished. No distress.  HENT:  Head: Normocephalic and atraumatic.  Eyes: Conjunctivae and EOM are normal. Pupils are equal, round, and reactive to light. Right eye exhibits no discharge. Left eye exhibits no discharge. No scleral icterus.  Neck: Normal range of motion. Neck supple.  Cardiovascular: Normal rate, regular rhythm and normal heart sounds.   No murmur heard. Pulmonary/Chest: Effort normal and breath sounds normal. No respiratory distress. He has no wheezes. He has no rales. He exhibits no tenderness.  Axillary negative  Musculoskeletal: He exhibits tenderness. He exhibits no edema.  Inability to abduct the right arm and shoulder. There is no point tenderness.  Lymphadenopathy:    He has no cervical adenopathy.  Neurological: He is alert and oriented to person, place, and time. He has normal reflexes. No cranial nerve deficit.  Skin: Skin is warm and dry. No rash noted.  Psychiatric: He has a normal mood and affect. His behavior is normal. Judgment and thought content normal.  Nursing note and vitals reviewed.  BP 112/76 mmHg  Pulse 66  Temp(Src) 97.7 F (36.5 C) (Oral)  Ht 5' 10.5" (1.791 m)  Wt 179 lb 9.6 oz (81.466 kg)  BMI 25.40 kg/m2  [facility] reading (PRIMARY) by  Dr. Orpha Bur-   degenerative changes more  prominent in the lower cervical disc spaces                                     Assessment & Plan:  1. Right shoulder pain -Continue to take Tylenol for pain - DG Cervical Spine Complete; Future - MR Cervical Spine Wo Contrast; Future - MR Shoulder Right Wo Contrast; Future  2. Left shoulder pain -Continue to take Tylenol for pain  Patient Instructions  Take extra strength Tylenol if needed for pain We will get the C-spine films today  and arrange to get an MRI of the right shoulder and of the C-spine. This should give [place] the information we need to help arrive at the cause for the pain. We will call him when these results are obtained and seen   Arrie Senate MD

## 2015-09-02 NOTE — Patient Instructions (Signed)
Take extra strength Tylenol if needed for pain We will get the C-spine films today and arrange to get an MRI of the right shoulder and of the C-spine. This should give [place] the information we need to help arrive at the cause for the pain. We will call him when these results are obtained and seen

## 2015-09-09 NOTE — Progress Notes (Signed)
Quick Note:  Believe this was sent to me by mistake ______

## 2015-09-23 ENCOUNTER — Other Ambulatory Visit: Payer: Self-pay | Admitting: Family Medicine

## 2015-09-23 ENCOUNTER — Ambulatory Visit (HOSPITAL_COMMUNITY)
Admission: RE | Admit: 2015-09-23 | Discharge: 2015-09-23 | Disposition: A | Discharge status: Home / Self Care | Payer: 59 | Source: Ambulatory Visit | Attending: Family Medicine | Admitting: Family Medicine

## 2015-09-23 DIAGNOSIS — M542 Cervicalgia: Secondary | ICD-10-CM | POA: Insufficient documentation

## 2015-09-23 DIAGNOSIS — M5021 Other cervical disc displacement,  high cervical region: Secondary | ICD-10-CM | POA: Insufficient documentation

## 2015-09-23 DIAGNOSIS — M25511 Pain in right shoulder: Secondary | ICD-10-CM | POA: Insufficient documentation

## 2015-09-23 DIAGNOSIS — Z01818 Encounter for other preprocedural examination: Secondary | ICD-10-CM

## 2015-09-23 DIAGNOSIS — M2578 Osteophyte, vertebrae: Secondary | ICD-10-CM | POA: Insufficient documentation

## 2015-09-24 ENCOUNTER — Telehealth: Payer: Self-pay

## 2015-09-24 NOTE — Telephone Encounter (Signed)
Pt aware of appointment date/time with Dr.Moore of 09/25/2015 at 12pm

## 2015-09-25 ENCOUNTER — Ambulatory Visit (INDEPENDENT_AMBULATORY_CARE_PROVIDER_SITE_OTHER): Payer: 59

## 2015-09-25 ENCOUNTER — Other Ambulatory Visit: Payer: Self-pay | Admitting: *Deleted

## 2015-09-25 DIAGNOSIS — Z23 Encounter for immunization: Secondary | ICD-10-CM

## 2015-09-25 DIAGNOSIS — M7501 Adhesive capsulitis of right shoulder: Secondary | ICD-10-CM

## 2015-09-25 DIAGNOSIS — G629 Polyneuropathy, unspecified: Secondary | ICD-10-CM

## 2015-09-25 DIAGNOSIS — M25512 Pain in left shoulder: Secondary | ICD-10-CM

## 2015-09-25 DIAGNOSIS — M5030 Other cervical disc degeneration, unspecified cervical region: Secondary | ICD-10-CM

## 2015-09-25 DIAGNOSIS — M25511 Pain in right shoulder: Secondary | ICD-10-CM

## 2015-10-01 ENCOUNTER — Telehealth: Payer: Self-pay | Admitting: Family Medicine

## 2015-10-01 NOTE — Telephone Encounter (Signed)
Please advise if you want me to try somewhere else

## 2015-10-02 ENCOUNTER — Encounter: Payer: Self-pay | Admitting: Family Medicine

## 2015-10-04 ENCOUNTER — Encounter: Payer: Self-pay | Admitting: Family Medicine

## 2015-10-08 NOTE — Telephone Encounter (Signed)
If Dr. Owens Shark cannot see him please refer this patient to Dr. Joya Salm and have him see him as soon as possible because of his ongoing pain

## 2015-10-09 NOTE — Telephone Encounter (Signed)
Dr brown called and appt made for 10/31/15.

## 2016-01-17 DIAGNOSIS — M4802 Spinal stenosis, cervical region: Secondary | ICD-10-CM | POA: Insufficient documentation

## 2016-03-17 ENCOUNTER — Telehealth: Payer: Self-pay

## 2016-08-06 ENCOUNTER — Telehealth: Payer: Self-pay | Admitting: Family Medicine

## 2016-08-06 ENCOUNTER — Ambulatory Visit (INDEPENDENT_AMBULATORY_CARE_PROVIDER_SITE_OTHER): Payer: 59 | Admitting: Family Medicine

## 2016-08-06 ENCOUNTER — Encounter: Payer: Self-pay | Admitting: Family Medicine

## 2016-08-06 VITALS — BP 131/86 | HR 88 | Temp 98.9°F | Ht 70.5 in | Wt 185.0 lb

## 2016-08-06 DIAGNOSIS — Z23 Encounter for immunization: Secondary | ICD-10-CM | POA: Diagnosis not present

## 2016-08-06 DIAGNOSIS — L409 Psoriasis, unspecified: Secondary | ICD-10-CM

## 2016-08-06 DIAGNOSIS — J441 Chronic obstructive pulmonary disease with (acute) exacerbation: Secondary | ICD-10-CM

## 2016-08-06 DIAGNOSIS — J449 Chronic obstructive pulmonary disease, unspecified: Secondary | ICD-10-CM

## 2016-08-06 MED ORDER — BETAMETHASONE DIPROPIONATE AUG 0.05 % EX OINT: TOPICAL_OINTMENT | Freq: Two times a day (BID) | CUTANEOUS | 10 refills | Status: DC

## 2016-08-06 MED ORDER — AMOXICILLIN-POT CLAVULANATE 875-125 MG PO TABS: 1.0000 | ORAL_TABLET | Freq: Two times a day (BID) | ORAL | 0 refills | Status: DC

## 2016-08-06 MED ORDER — FLUTICASONE-SALMETEROL 250-50 MCG/DOSE IN AEPB: 1.0000 | INHALATION_SPRAY | Freq: Two times a day (BID) | RESPIRATORY_TRACT | 11 refills | Status: DC

## 2016-08-06 MED ORDER — MOMETASONE FURO-FORMOTEROL FUM 200-5 MCG/ACT IN AERO: 2.0000 | INHALATION_SPRAY | Freq: Two times a day (BID) | RESPIRATORY_TRACT | 12 refills | Status: DC

## 2016-08-06 NOTE — Addendum Note (Signed)
Addended by: [name] on: 08/06/2016 05:17 PM   Modules accepted: Orders

## 2016-08-06 NOTE — Progress Notes (Addendum)
Subjective:  Patient ID: Daniel [name], male    DOB: 1956/05/11  Age: 60 y.o. MRN: [id number]  CC: URI (pt here today c/o productive cough for the past few weeks, also states he is out of his psoriasis medication and would like a refill)   HPI [name] presents for Patient presents with upper respiratory congestion. Rhinorrhea that is frequently purulent. There is moderate sore throat. Patient reports coughing frequently as well.-colored/purulent sputum noted. There is no fever no chills no sweats. The patient denies being short of breath. Onset was 3-5 days ago. Gradually worsening in spite of home remedies.    History [name] has a past medical history of BPH (benign prostatic hyperplasia); COPD (chronic obstructive pulmonary disease) ([place]); Hyperlipidemia; and Pulmonary nodule.   He has a past surgical history that includes No past surgeries.   His family history includes Cancer in his father; Emphysema in his father; Lung cancer in his mother.He reports that he has been smoking Cigarettes.  He has a 42.00 pack-year smoking history. He has never used smokeless tobacco. He reports that he drinks about 7.0 oz of alcohol per week . His drug history is not on file.    ROS Review of Systems  Constitutional: Negative for chills, diaphoresis and fever.  HENT: Negative for rhinorrhea and sore throat.   Respiratory: Negative for cough and shortness of breath.   Cardiovascular: Negative for chest pain.  Gastrointestinal: Negative for abdominal pain.  Musculoskeletal: Negative for arthralgias and myalgias.  Skin: Rash: ;History of psoriasis. Currently there is a small amount on the left knee. However it is flaring significantly at the left scalp. He has been using betamethasone in the past but has not had any quite some time. Would like refills of that..  Neurological: Negative for weakness and headaches.    Objective:  BP 131/86   Pulse 88   Temp 98.9 F (37.2 C) (Oral)    Ht 5' 10.5" (1.791 m)   Wt 185 lb (83.9 kg)   SpO2 98%   BMI 26.17 kg/m   BP Readings from Last 3 Encounters:  08/06/16 131/86  09/02/15 112/76  02/07/15 105/70    Wt Readings from Last 3 Encounters:  08/06/16 185 lb (83.9 kg)  09/02/15 179 lb 9.6 oz (81.5 kg)  02/07/15 174 lb (78.9 kg)     Physical Exam  Constitutional: He appears well-developed and well-nourished.  HENT:  Head: Normocephalic and atraumatic.  Right Ear: Tympanic membrane and external ear normal. No decreased hearing is noted.  Left Ear: Tympanic membrane and external ear normal. No decreased hearing is noted.  Nose: Mucosal edema present. Right sinus exhibits no frontal sinus tenderness. Left sinus exhibits no frontal sinus tenderness.  Mouth/Throat: No oropharyngeal exudate or posterior oropharyngeal erythema.  Neck: No Brudzinski's sign noted.  Pulmonary/Chest: No respiratory distress. He has wheezes.  Lymphadenopathy:       Head (right side): No preauricular adenopathy present.       Head (left side): No preauricular adenopathy present.       Right cervical: No superficial cervical adenopathy present.      Left cervical: No superficial cervical adenopathy present.  Skin: Rash (lichenified plaque with excoriation and silver scale 2X4 cm Left temporal scalp) noted.     Lab Results  Component Value Date   WBC 6.2 05/08/2014   HGB 16.0 05/08/2014   HCT 44.1 05/08/2014   PLT 183 05/08/2014   GLUCOSE 95 07/18/2013   CHOL 213 (H) 07/18/2013  TRIG 74 07/18/2013   HDL 65 07/18/2013   LDLCALC 133 (H) 07/18/2013   ALT 14 07/18/2013   AST 19 07/18/2013   NA 140 07/18/2013   K 4.7 07/18/2013   CL 100 07/18/2013   CREATININE 0.93 07/18/2013   BUN 13 07/18/2013   CO2 24 07/18/2013   TSH 3.730 07/18/2013   PSA 0.2 07/18/2013       Assessment & Plan:   Slevin was seen today for uri.  Diagnoses and all orders for this visit:  Chronic obstructive pulmonary disease, unspecified COPD type  ([place])  Chronic obstructive pulmonary disease with acute exacerbation (HCC)  Psoriasis of scalp  Encounter for immunization -     Flu Vaccine QUAD 36+ mos IM  Other orders -     Discontinue: mometasone-formoterol (DULERA) 200-5 MCG/ACT AERO; Inhale 2 puffs into the lungs 2 (two) times daily. -     augmented betamethasone dipropionate (DIPROLENE) 0.05 % ointment; Apply topically 2 (two) times daily. -     Fluticasone-Salmeterol (ADVAIR DISKUS) 250-50 MCG/DOSE AEPB; Inhale 1 puff into the lungs 2 (two) times daily. -     amoxicillin-clavulanate (AUGMENTIN) 875-125 MG tablet; Take 1 tablet by mouth 2 (two) times daily. Take all of this medication      I have discontinued Mr. Roome mometasone-formoterol and mometasone-formoterol. I am also having him start on Fluticasone-Salmeterol and amoxicillin-clavulanate. Additionally, I am having him maintain his augmented betamethasone dipropionate.  Meds ordered this encounter  Medications  . DISCONTD: mometasone-formoterol (DULERA) 200-5 MCG/ACT AERO    Sig: Inhale 2 puffs into the lungs 2 (two) times daily.    Dispense:  1 Inhaler    Refill:  12  . augmented betamethasone dipropionate (DIPROLENE) 0.05 % ointment    Sig: Apply topically 2 (two) times daily.    Dispense:  50 g    Refill:  10  . Fluticasone-Salmeterol (ADVAIR DISKUS) 250-50 MCG/DOSE AEPB    Sig: Inhale 1 puff into the lungs 2 (two) times daily.    Dispense:  1 each    Refill:  11  . amoxicillin-clavulanate (AUGMENTIN) 875-125 MG tablet    Sig: Take 1 tablet by mouth 2 (two) times daily. Take all of this medication    Dispense:  20 tablet    Refill:  0     Follow-up: Return if symptoms worsen or fail to improve.  [name], M.D.

## 2016-08-06 NOTE — Addendum Note (Signed)
Addended by: [name] on: 08/06/2016 04:38 PM   Modules accepted: Orders

## 2016-11-30 ENCOUNTER — Ambulatory Visit (INDEPENDENT_AMBULATORY_CARE_PROVIDER_SITE_OTHER): Payer: 59 | Admitting: Family Medicine

## 2016-11-30 ENCOUNTER — Encounter: Payer: Self-pay | Admitting: Family Medicine

## 2016-11-30 VITALS — BP 101/70 | HR 103 | Temp 101.3°F | Ht 70.5 in | Wt 185.0 lb

## 2016-11-30 DIAGNOSIS — J111 Influenza due to unidentified influenza virus with other respiratory manifestations: Secondary | ICD-10-CM

## 2016-11-30 MED ORDER — OSELTAMIVIR PHOSPHATE 75 MG PO CAPS: 75.0000 mg | ORAL_CAPSULE | Freq: Two times a day (BID) | ORAL | 0 refills | Status: DC

## 2016-11-30 NOTE — Progress Notes (Signed)
Subjective:  Patient ID: Daniel [name], male    DOB: 1955/11/24  Age: 61 y.o. MRN: [id number]  CC: Generalized Body Aches (pt here today c/o body aches, chills, sore throat and fever that started abruptly last night, had the flu shot here.)   HPI Tamari Wehri presents for  Patient presents with dry cough runny stuffy nose. Diffuse headache of moderate intensity. Patient also has chills and subjective fever. Body aches worst in the back but present in the legs, shoulders, and torso as well. Has sapped the energy to the point that of being unable to perform usual activities other than ADLs. Onset last night.   History Amin has a past medical history of BPH (benign prostatic hyperplasia); COPD (chronic obstructive pulmonary disease) ([place]); Hyperlipidemia; and Pulmonary nodule.   He has a past surgical history that includes No past surgeries.   His family history includes Cancer in his father; Emphysema in his father; Lung cancer in his mother.He reports that he has been smoking Cigarettes.  He has a 42.00 pack-year smoking history. He has never used smokeless tobacco. He reports that he drinks about 7.0 oz of alcohol per week . His drug history is not on file.  Current Outpatient Prescriptions on File Prior to Visit  Medication Sig Dispense Refill  . augmented betamethasone dipropionate (DIPROLENE) 0.05 % ointment Apply topically 2 (two) times daily. 50 g 10  . Fluticasone-Salmeterol (ADVAIR DISKUS) 250-50 MCG/DOSE AEPB Inhale 1 puff into the lungs 2 (two) times daily. 1 each 11   No current facility-administered medications on file prior to visit.     ROS Review of Systems  Constitutional: Positive for activity change, appetite change, chills and fever.  HENT: Negative for congestion, ear discharge, ear pain, hearing loss, nosebleeds, postnasal drip, rhinorrhea, sinus pressure, sneezing and trouble swallowing.   Respiratory: Positive for cough. Negative for chest tightness  and shortness of breath.   Cardiovascular: Negative for chest pain and palpitations.  Musculoskeletal: Positive for myalgias.  Skin: Negative for color change and rash.    Objective:  BP 101/70   Pulse (!) 103   Temp (!) 101.3 F (38.5 C)   Ht 5' 10.5" (1.791 m)   Wt 185 lb (83.9 kg)   BMI 26.17 kg/m   Physical Exam  Constitutional: He is oriented to person, place, and time. He appears well-developed and well-nourished.  HENT:  Head: Normocephalic and atraumatic.  Right Ear: Tympanic membrane and external ear normal. No decreased hearing is noted.  Left Ear: Tympanic membrane and external ear normal. No decreased hearing is noted.  Nose: Mucosal edema present. Right sinus exhibits no frontal sinus tenderness. Left sinus exhibits no frontal sinus tenderness.  Mouth/Throat: No oropharyngeal exudate or posterior oropharyngeal erythema.  Eyes: EOM are normal. Pupils are equal, round, and reactive to light.  Neck: No Brudzinski's sign noted.  Pulmonary/Chest: Breath sounds normal. No respiratory distress. He has no wheezes. He has no rales.  Abdominal: Soft. There is no tenderness.  Lymphadenopathy:       Head (right side): No preauricular adenopathy present.       Head (left side): No preauricular adenopathy present.       Right cervical: No superficial cervical adenopathy present.      Left cervical: No superficial cervical adenopathy present.  Neurological: He is alert and oriented to person, place, and time.  Skin: Skin is warm and dry.    Assessment & Plan:   Jameel was seen today for generalized body aches.  Diagnoses and all orders for this visit:  Influenza with respiratory manifestation  Other orders -     oseltamivir (TAMIFLU) 75 MG capsule; Take 1 capsule (75 mg total) by mouth 2 (two) times daily.   I have discontinued Mr. Santangelo amoxicillin-clavulanate. I am also having him start on oseltamivir. Additionally, I am having him maintain his augmented  betamethasone dipropionate and Fluticasone-Salmeterol.  Meds ordered this encounter  Medications  . oseltamivir (TAMIFLU) 75 MG capsule    Sig: Take 1 capsule (75 mg total) by mouth 2 (two) times daily.    Dispense:  10 capsule    Refill:  0     Follow-up: Return if symptoms worsen or fail to improve.  [name], M.D.

## 2017-03-01 ENCOUNTER — Encounter: Payer: Self-pay | Admitting: Family Medicine

## 2017-03-01 ENCOUNTER — Ambulatory Visit (INDEPENDENT_AMBULATORY_CARE_PROVIDER_SITE_OTHER): Payer: 59 | Admitting: Family Medicine

## 2017-03-01 ENCOUNTER — Ambulatory Visit (INDEPENDENT_AMBULATORY_CARE_PROVIDER_SITE_OTHER): Payer: 59

## 2017-03-01 VITALS — BP 120/67 | HR 67 | Temp 98.6°F | Ht 70.5 in | Wt 182.0 lb

## 2017-03-01 DIAGNOSIS — J329 Chronic sinusitis, unspecified: Secondary | ICD-10-CM

## 2017-03-01 DIAGNOSIS — J40 Bronchitis, not specified as acute or chronic: Secondary | ICD-10-CM

## 2017-03-01 DIAGNOSIS — J441 Chronic obstructive pulmonary disease with (acute) exacerbation: Secondary | ICD-10-CM

## 2017-03-01 MED ORDER — BETAMETHASONE SOD PHOS & ACET 6 (3-3) MG/ML IJ SUSP
6.0000 mg | Freq: Once | INTRAMUSCULAR | Status: AC
  Administered 2017-03-01: 6 mg via INTRAMUSCULAR

## 2017-03-01 MED ORDER — AMOXICILLIN-POT CLAVULANATE 875-125 MG PO TABS: 1.0000 | ORAL_TABLET | Freq: Two times a day (BID) | ORAL | 0 refills | Status: DC

## 2017-03-01 NOTE — Progress Notes (Signed)
Subjective:  Patient ID: Arda Keadle, male    DOB: [birth year]  Age: 61 y.o. MRN: [id number]  CC: Cough (pt here today c/o cough and sinus problems)   HPI Ramil Edgington presents for Patient presents with upper respiratory congestion. Rhinorrhea that is frequently purulent. There is moderate sore throat. Patient reports coughing frequently as well.  Green sputum noted. There is no fever, chills or sweats. The patienthas been somewhat short of breath. Onset was 7 days ago. Gradually worsening. Tried OTCs without improvement.   History Pietro has a past medical history of BPH (benign prostatic hyperplasia); COPD (chronic obstructive pulmonary disease) ([place]); Hyperlipidemia; and Pulmonary nodule.   He has a past surgical history that includes No past surgeries.   His family history includes Cancer in his father; Emphysema in his father; Lung cancer in his mother.He reports that he has been smoking Cigarettes.  He has a 42.00 pack-year smoking history. He has never used smokeless tobacco. He reports that he drinks about 7.0 oz of alcohol per week . His drug history is not on file.  Current Outpatient Prescriptions on File Prior to Visit  Medication Sig Dispense Refill  . augmented betamethasone dipropionate (DIPROLENE) 0.05 % ointment Apply topically 2 (two) times daily. 50 g 10  . Fluticasone-Salmeterol (ADVAIR DISKUS) 250-50 MCG/DOSE AEPB Inhale 1 puff into the lungs 2 (two) times daily. 1 each 11   No current facility-administered medications on file prior to visit.     ROS Review of Systems  Constitutional: Negative for activity change, appetite change, chills and fever.  HENT: Positive for congestion, postnasal drip, rhinorrhea and sinus pressure. Negative for ear discharge, ear pain, hearing loss, nosebleeds, sneezing and trouble swallowing.   Respiratory: Positive for cough and shortness of breath. Negative for chest tightness.   Cardiovascular: Negative for chest pain  and palpitations.  Skin: Negative for rash.    Objective:  BP 120/67   Pulse 67   Temp 98.6 F (37 C) (Oral)   Ht 5' 10.5" (1.791 m)   Wt 182 lb (82.6 kg)   BMI 25.75 kg/m   Physical Exam  Constitutional: He appears well-developed and well-nourished.  HENT:  Head: Normocephalic and atraumatic.  Right Ear: Tympanic membrane and external ear normal. No decreased hearing is noted.  Left Ear: Tympanic membrane and external ear normal. No decreased hearing is noted.  Nose: Mucosal edema present. Right sinus exhibits no frontal sinus tenderness. Left sinus exhibits no frontal sinus tenderness.  Mouth/Throat: No oropharyngeal exudate or posterior oropharyngeal erythema.  Neck: No Brudzinski's sign noted.  Pulmonary/Chest: Breath sounds normal. No respiratory distress.  Lymphadenopathy:       Head (right side): No preauricular adenopathy present.       Head (left side): No preauricular adenopathy present.       Right cervical: No superficial cervical adenopathy present.      Left cervical: No superficial cervical adenopathy present.    Assessment & Plan:   Tryce was seen today for cough.  Diagnoses and all orders for this visit:  Chronic obstructive pulmonary disease with acute exacerbation (HCC) -     betamethasone acetate-betamethasone sodium phosphate (CELESTONE) injection 6 mg; Inject 1 mL (6 mg total) into the muscle once. -     DG Chest 2 View; Future  Sinobronchitis  Other orders -     amoxicillin-clavulanate (AUGMENTIN) 875-125 MG tablet; Take 1 tablet by mouth 2 (two) times daily. Take all of this medication   I have discontinued Mr. Nannini  oseltamivir. I am also having him start on amoxicillin-clavulanate. Additionally, I am having him maintain his augmented betamethasone dipropionate and Fluticasone-Salmeterol. We administered betamethasone acetate-betamethasone sodium phosphate.  Meds ordered this encounter  Medications  . betamethasone  acetate-betamethasone sodium phosphate (CELESTONE) injection 6 mg  . amoxicillin-clavulanate (AUGMENTIN) 875-125 MG tablet    Sig: Take 1 tablet by mouth 2 (two) times daily. Take all of this medication    Dispense:  20 tablet    Refill:  0   CXR _ COPD, nonacute, no infiltrate  Follow-up: No Follow-up on file.  [name], M.D.

## 2017-03-04 ENCOUNTER — Encounter: Payer: Self-pay | Admitting: Pediatrics

## 2017-03-04 ENCOUNTER — Ambulatory Visit (INDEPENDENT_AMBULATORY_CARE_PROVIDER_SITE_OTHER): Payer: 59 | Admitting: Pediatrics

## 2017-03-04 VITALS — BP 108/69 | HR 64 | Temp 98.0°F | Resp 18 | Ht 70.5 in | Wt 180.6 lb

## 2017-03-04 DIAGNOSIS — J069 Acute upper respiratory infection, unspecified: Secondary | ICD-10-CM

## 2017-03-04 DIAGNOSIS — J449 Chronic obstructive pulmonary disease, unspecified: Secondary | ICD-10-CM | POA: Diagnosis not present

## 2017-03-04 MED ORDER — SPACER/AERO CHAMBER MOUTHPIECE MISC: 1.0000 | Freq: Four times a day (QID) | 0 refills | Status: DC | PRN

## 2017-03-04 MED ORDER — ALBUTEROL SULFATE HFA 108 (90 BASE) MCG/ACT IN AERS: 2.0000 | INHALATION_SPRAY | Freq: Four times a day (QID) | RESPIRATORY_TRACT | 2 refills | Status: DC | PRN

## 2017-03-04 NOTE — Progress Notes (Signed)
  Subjective:   Patient ID: Daniel [name], male    DOB: 1956/08/10, 61 y.o.   MRN: [id number] CC: Cough and Nasal Congestion  HPI: Daniel [name] is a 61 y.o. male presenting for Cough and Nasal Congestion  Seen earlier this week Started on augmentin three days ago Had steroid shot Felt better that day, now feeling worse Taking advair daily, says doesn't have albuterol anymore at home  Bothered most now by sinus drainage, continues to be yellow, thick Coughing still some  Relevant past medical, surgical, family and social history reviewed. Allergies and medications reviewed and updated. History  Smoking Status  . Current Every Day Smoker  . Packs/day: 1.00  . Years: 42.00  . Types: Cigarettes  Smokeless Tobacco  . Never Used   ROS: Per HPI   Objective:    BP 108/69   Pulse 64   Temp 98 F (36.7 C) (Oral)   Resp 18   Ht 5' 10.5" (1.791 m)   Wt 180 lb 9.6 oz (81.9 kg)   SpO2 96%   BMI 25.55 kg/m   Wt Readings from Last 3 Encounters:  03/04/17 180 lb 9.6 oz (81.9 kg)  03/01/17 182 lb (82.6 kg)  11/30/16 185 lb (83.9 kg)    Gen: NAD, alert, cooperative with exam, NCAT EYES: EOMI, no conjunctival injection, or no icterus ENT:  TMs pearly gray b/l, OP with mild erythema, mildly ttp over max/frontal sinuses LYMPH: no cervical LAD CV: NRRR, normal S1/S2, no murmur Resp: CTABL, no wheezes, normal WOB Abd: +BS, soft, NTND.  Ext: No edema, warm Neuro: Alert and oriented  Assessment & Plan:  Daniel [name] was seen today for cough and nasal congestion. Cough bothering him mostly at night when he lays down. Lots of sinus drainage.  Diagnoses and all orders for this visit:  Chronic obstructive pulmonary disease, unspecified COPD type ([place]) No wheezing today Can try albuterol prn for cough given h/o COPD Cont advair -     albuterol (PROVENTIL HFA;VENTOLIN HFA) 108 (90 Base) MCG/ACT inhaler; Inhale 2 puffs into the lungs every 6 (six) hours as needed for wheezing or  shortness of breath. -     Spacer/Aero Chamber Mouthpiece MISC; 1 each by Does not apply route every 6 (six) hours as needed.  Sinusitis Cont augmentin Discussed sinus rinses BID  Acute URI Discussed symptom care  Follow up plan: prn Assunta Found, MD [place]

## 2017-03-04 NOTE — Patient Instructions (Signed)
Netipot with distilled water 2-3 times a day to clear out sinuses Or Normal saline nasal spray Flonase steroid nasal spray Antihistamine daily such as cetirizine Lots of fluids  

## 2017-08-09 ENCOUNTER — Other Ambulatory Visit: Payer: Self-pay | Admitting: Family Medicine

## 2017-09-13 ENCOUNTER — Other Ambulatory Visit: Payer: Self-pay | Admitting: Family Medicine

## 2017-10-15 ENCOUNTER — Other Ambulatory Visit: Payer: Self-pay | Admitting: Family Medicine

## 2017-10-22 ENCOUNTER — Other Ambulatory Visit: Payer: Self-pay | Admitting: Family Medicine

## 2017-10-27 ENCOUNTER — Other Ambulatory Visit: Payer: Self-pay | Admitting: *Deleted

## 2017-10-27 ENCOUNTER — Telehealth: Payer: Self-pay | Admitting: Family Medicine

## 2017-10-27 MED ORDER — FLUTICASONE-SALMETEROL 250-50 MCG/DOSE IN AEPB: 1.0000 | INHALATION_SPRAY | Freq: Two times a day (BID) | RESPIRATORY_TRACT | 0 refills | Status: DC

## 2017-10-27 NOTE — Telephone Encounter (Signed)
What is the name of the medication? Advair Diskus 250-50   Have you contacted your pharmacy to request a refill? YES  Which pharmacy would you like this sent to? [facility] in [place]   Patient notified that their request is being sent to the clinical staff for review and that they should receive a call once it is complete. If they do not receive a call within 24 hours they can check with their pharmacy or our office.

## 2017-11-18 ENCOUNTER — Encounter: Payer: Self-pay | Admitting: Pediatrics

## 2017-11-18 ENCOUNTER — Ambulatory Visit (INDEPENDENT_AMBULATORY_CARE_PROVIDER_SITE_OTHER): Payer: 59 | Admitting: Pediatrics

## 2017-11-18 VITALS — BP 162/94 | HR 66 | Temp 97.3°F | Ht 70.5 in | Wt 185.4 lb

## 2017-11-18 DIAGNOSIS — R51 Headache: Secondary | ICD-10-CM | POA: Diagnosis not present

## 2017-11-18 DIAGNOSIS — I10 Essential (primary) hypertension: Secondary | ICD-10-CM | POA: Diagnosis not present

## 2017-11-18 DIAGNOSIS — Z23 Encounter for immunization: Secondary | ICD-10-CM | POA: Diagnosis not present

## 2017-11-18 DIAGNOSIS — R519 Headache, unspecified: Secondary | ICD-10-CM

## 2017-11-18 DIAGNOSIS — J449 Chronic obstructive pulmonary disease, unspecified: Secondary | ICD-10-CM | POA: Diagnosis not present

## 2017-11-18 MED ORDER — LISINOPRIL 10 MG PO TABS: 10.0000 mg | ORAL_TABLET | Freq: Every day | ORAL | 3 refills | Status: DC

## 2017-11-18 NOTE — Progress Notes (Signed)
  Subjective:   Patient ID: Daniel [name], male    DOB: 02-16-1956, 62 y.o.   MRN: [id number] CC: Headache and Neck Pain  HPI: Daniel [name] is a 62 y.o. male presenting for Headache and Neck Pain  Has headaches off and on Hurts all over his head, feels like a vice  Has some neck pain off and on that he thinks is chronic though sometimes headaches seem to start in his neck and go up to top of his head, h/o neck fusion  Last night took musle relaxer and pain medicine, woke up with headache when he started coughing Every day past 5 days has had HA  Ibuprofen helps some with HA Clearing his throat some more, breathing has been fine  Elevated blood pressure: thinks for the last month has been higher than usual but doesn't remember any numbers  Smokes daily  COPD: taking advair regularly, no change in breathing, not needing albuterol regularly  Relevant past medical, surgical, family and social history reviewed. Allergies and medications reviewed and updated. Social History   Tobacco Use  Smoking Status Current Every Day Smoker  . Packs/day: 1.00  . Years: 42.00  . Pack years: 42.00  . Types: Cigarettes  Smokeless Tobacco Never Used   ROS: Per HPI   Objective:    BP (!) 162/94   Pulse 66   Temp (!) 97.3 F (36.3 C) (Oral)   Ht 5' 10.5" (1.791 m)   Wt 185 lb 6.4 oz (84.1 kg)   BMI 26.23 kg/m   Wt Readings from Last 3 Encounters:  11/18/17 185 lb 6.4 oz (84.1 kg)  03/04/17 180 lb 9.6 oz (81.9 kg)  03/01/17 182 lb (82.6 kg)    Gen: NAD, alert, cooperative with exam, NCAT EYES: EOMI, no conjunctival injection, or no icterus ENT:  TMs pearly gray b/l, OP without erythema LYMPH: no cervical LAD CV: NRRR, normal S1/S2, no murmur, distal pulses 2+ b/l Resp: CTABL, no wheezes, normal WOB Abd: +BS, soft, NTND. no guarding or organomegaly Ext: No edema, warm Neuro: Alert and oriented, strength equal b/l UE and LE, sensation intact b/l, coordination grossly  normal MSK: normal muscle bulk  Assessment & Plan:  Kinsler was seen today for headache and neck pain.  Diagnoses and all orders for this visit:  Essential hypertension Elevated today Will start lisinopril, chek blood work -     BMP8+EGFR -     lisinopril (PRINIVIL,ZESTRIL) 10 MG tablet; Take 1 tablet (10 mg total) by mouth daily.  Chronic obstructive pulmonary disease, unspecified COPD type ([place]) Stable, cont current meds  Nonintractable episodic headache, unspecified headache type Suspect related to elevated BP Pt to set up eye exam, cont ibuprofne prn Let me know if any worsening in symptoms  Follow up plan: Return in about 2 weeks (around 12/02/2017). Assunta Found, MD [place]

## 2017-11-18 NOTE — Patient Instructions (Signed)
Check your blood pressure daily for the next two weeks Let me know if regularly >140 on top or >90 on bottom, I will increase your medicine before you come back  Call to set up appointment with eye doctor for exam  Come back to see Daniel [name] in 2 weeks

## 2017-11-19 LAB — BMP8+EGFR
BUN/Creatinine Ratio: 14 (ref 10–24)
BUN: 19 mg/dL (ref 8–27)
CO2: 22 mmol/L (ref 20–29)
Calcium: 9.2 mg/dL (ref 8.6–10.2)
Chloride: 102 mmol/L (ref 96–106)
Creatinine, Ser: 1.33 mg/dL — ABNORMAL HIGH (ref 0.76–1.27)
GFR calc Af Amer: 66 mL/min/{1.73_m2} (ref >59)
GFR calc non Af Amer: 57 mL/min/{1.73_m2} — ABNORMAL LOW (ref >59)
Glucose: 83 mg/dL (ref 65–99)
Potassium: 5.2 mmol/L (ref 3.5–5.2)
Sodium: 139 mmol/L (ref 134–144)

## 2017-11-20 ENCOUNTER — Ambulatory Visit: Payer: 59 | Admitting: Family Medicine

## 2017-11-20 ENCOUNTER — Encounter: Payer: Self-pay | Admitting: Family Medicine

## 2017-11-20 ENCOUNTER — Telehealth: Payer: Self-pay | Admitting: Family Medicine

## 2017-11-20 VITALS — BP 148/83 | HR 64 | Temp 97.0°F | Ht 70.5 in | Wt 188.0 lb

## 2017-11-20 DIAGNOSIS — I10 Essential (primary) hypertension: Secondary | ICD-10-CM | POA: Diagnosis not present

## 2017-11-20 DIAGNOSIS — R519 Headache, unspecified: Secondary | ICD-10-CM

## 2017-11-20 DIAGNOSIS — R51 Headache: Secondary | ICD-10-CM

## 2017-11-20 DIAGNOSIS — F1020 Alcohol dependence, uncomplicated: Secondary | ICD-10-CM

## 2017-11-20 MED ORDER — METHYLPREDNISOLONE ACETATE 80 MG/ML IJ SUSP
80.0000 mg | Freq: Once | INTRAMUSCULAR | Status: AC
  Administered 2017-11-20: 80 mg via INTRAMUSCULAR

## 2017-11-20 MED ORDER — AMLODIPINE BESYLATE 5 MG PO TABS: 5.0000 mg | ORAL_TABLET | Freq: Every day | ORAL | 5 refills | Status: DC

## 2017-11-20 NOTE — Patient Instructions (Signed)
Consider tapering off of alcohol or at least tapering it down by 1 ounce of whiskey every couple of days until your consumption is 2 ounces or less daily.  Based on our discussion your consumption appears to be 10 ounces or more daily.  We can taper by 1 ounce every couple of days she will be down to 2 ounces in about 16 days.  For the headaches ibuprofen is adequate as long as it is working well for you to give you the relief you need.  However it is not compatible with lisinopril for blood pressure.  Therefore I would like for you to discontinue the lisinopril.  Start taking amlodipine instead once a day.  It will take a couple of weeks for it to get her pressure down where we want to be.  Meanwhile if the headaches continue in spite of all of these plans, we will need to consider testing at your follow-up.

## 2017-11-20 NOTE — Progress Notes (Signed)
Subjective:  Patient ID: Daniel [name], male    DOB: 13-Sep-1956  Age: 62 y.o. MRN: [id number]  CC: Headache (x 1 week ) and Hypertension ((started lisinopril recently) 10 mg )   HPI Daniel [name] presents for recheck of his headache and blood pressure.  He was started on lisinopril just a couple of days ago.  Today his blood pressure is very good here but at home this morning about an hour before presentation it was 176/103.  Additionally the headache has rebounded and was at 6-8/10.  He describes it as an all over throbbing that begins at the base of the neck and works its way forward.  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  He did schedule an eye exam for 2 days from today.  Of note is that he denies any type of neurological focal lateralizing signs such as numbness weakness tingling of any extremity or the face.  No weakness.  The vision is only affected as noted above.  We had a rather lengthy and frank discussion about alcohol.  He tells me that he drinks about 2 ounces of [place] whiskey 3 times a day.  On the other hand he says he goes through 1/2 gallon every 5 days.  His wife says he is not abusive in any way.  He seems to be a calm self-contained drunk who does not drive.  He just drinks a lot at home starting at about 11 in the morning.  This would indicate he is drinking more like 12 ounces a day.  Patient is also taking ibuprofen for the headaches and does not want to stop doing that.  He is aware that ibuprofen may block some of the positive impact of the lisinopril and would prefer to switch the blood pressure medicine then stop ibuprofen since it seems to be helping.  Of note is that Dr. Evette Doffing performed a basic metabolic profile 2 days ago which came back showing the creatinine mildly elevated at 1.33.  I suggested that he probably should be limiting his use of ibuprofen based on  that. Depression screen [facility] 2/9 11/20/2017 11/18/2017 03/04/2017  Decreased Interest 0 0 0  Down, Depressed, Hopeless 0 0 0  PHQ - 2 Score 0 0 0    History Daniel [name] has a past medical history of BPH (benign prostatic hyperplasia), COPD (chronic obstructive pulmonary disease) ([place]), Hyperlipidemia, and Pulmonary nodule.   He has a past surgical history that includes No past surgeries.   His family history includes Cancer in his father; Emphysema in his father; Lung cancer in his mother.He reports that he has been smoking cigarettes.  He has a 42.00 pack-year smoking history. he has never used smokeless tobacco. He reports that he drinks about 7.0 oz of alcohol per week. His drug history is not on file.    ROS Review of Systems  Constitutional: Negative for chills, diaphoresis, fever and unexpected weight change.  HENT: Negative for congestion, hearing loss, rhinorrhea and sore throat.   Eyes: Negative for visual disturbance.  Respiratory: Negative for cough and shortness of breath.   Cardiovascular: Negative for chest pain.  Gastrointestinal: Negative for abdominal pain, constipation and diarrhea.  Genitourinary: Negative for dysuria and flank pain.  Musculoskeletal: Negative for arthralgias and joint swelling.  Skin: Negative for rash.  Neurological: Positive for headaches (See HPI). Negative for dizziness, seizures, syncope, weakness and light-headedness.  Psychiatric/Behavioral: Negative  for agitation, dysphoric mood and sleep disturbance. The patient is not nervous/anxious.     Objective:  BP (!) 148/83 (BP Location: Left Arm)   Pulse 64   Temp (!) 97 F (36.1 C) (Oral)   Ht 5' 10.5" (1.791 m)   Wt 188 lb (85.3 kg)   BMI 26.59 kg/m   BP Readings from Last 3 Encounters:  11/20/17 (!) 148/83  11/18/17 (!) 162/94  03/04/17 108/69    Wt Readings from Last 3 Encounters:  11/20/17 188 lb (85.3 kg)  11/18/17 185 lb 6.4 oz (84.1 kg)  03/04/17 180 lb 9.6 oz (81.9 kg)      Physical Exam  Constitutional: He is oriented to person, place, and time. He appears well-developed and well-nourished. No distress.  HENT:  Head: Normocephalic and atraumatic.  Right Ear: External ear normal.  Left Ear: External ear normal.  Nose: Nose normal.  Mouth/Throat: Oropharynx is clear and moist.  Eyes: Conjunctivae and EOM are normal. Pupils are equal, round, and reactive to light.  Neck: Normal range of motion. Neck supple. No thyromegaly present.  Cardiovascular: Normal rate, regular rhythm and normal heart sounds.  No murmur heard. Pulmonary/Chest: Effort normal and breath sounds normal. No respiratory distress. He has no wheezes. He has no rales.  Abdominal: Soft. Bowel sounds are normal. He exhibits no distension. There is no tenderness.  Lymphadenopathy:    He has no cervical adenopathy.  Neurological: He is alert and oriented to person, place, and time. He has normal reflexes. He displays normal reflexes. No cranial nerve deficit. He exhibits normal muscle tone. Coordination normal.  Skin: Skin is warm and dry.  Psychiatric: He has a normal mood and affect. His behavior is normal. Judgment and thought content normal.      Assessment & Plan:   Daniel [name] was seen today for headache and hypertension.  Diagnoses and all orders for this visit:  Nonintractable headache, unspecified chronicity pattern, unspecified headache type  Acute intractable headache, unspecified headache type -     methylPREDNISolone acetate (DEPO-MEDROL) injection 80 mg  Alcoholism (HCC)  Essential hypertension -     amLODipine (NORVASC) 5 MG tablet; Take 1 tablet (5 mg total) by mouth daily. For blood pressure       I have discontinued Daniel Saxon [employer]is's [employer] and lisinopril. I am also having him start on amLODipine. Additionally, I am having him maintain his albuterol, augmented betamethasone dipropionate, and Fluticasone-Salmeterol. We administered  methylPREDNISolone acetate.  Allergies as of 11/20/2017   No Known Allergies     Medication List        Accurate as of 11/20/17 12:27 PM. Always use your most recent med list.          albuterol 108 (90 Base) MCG/ACT inhaler Commonly known as:  PROVENTIL HFA;VENTOLIN HFA Inhale 2 puffs into the lungs every 6 (six) hours as needed for wheezing or shortness of breath.   amLODipine 5 MG tablet Commonly known as:  NORVASC Take 1 tablet (5 mg total) by mouth daily. For blood pressure   augmented betamethasone dipropionate 0.05 % ointment Commonly known as:  DIPROLENE-AF APPLY TOPICALLY 2 (TWO) TIMES DAILY.   Fluticasone-Salmeterol 250-50 MCG/DOSE Aepb Commonly known as:  ADVAIR DISKUS Inhale 1 puff into the lungs 2 (two) times daily.      Consider tapering off of alcohol or at least tapering it down by 1 ounce of whiskey every couple of days until your consumption is 2 ounces or less daily.  Based on  our discussion your consumption appears to be 10 ounces or more daily.  I suggest that you taper by 1 ounce every couple of days so that you will be down to 2 ounces in about 16 days.  For the headaches ibuprofen is adequate as long as it is working well for you to give you the relief you need.  However it is not compatible with lisinopril for blood pressure.  Therefore I would like for you to discontinue the lisinopril.  Start taking amlodipine instead once a day.  It will take a couple of weeks for it to get her pressure down where we want to be.  Meanwhile if the headaches continue in spite of all of these plans, we will need to consider testing at your follow-up.  Follow-up: Return in about 2 weeks (around 12/04/2017).  [name], M.D.

## 2017-11-20 NOTE — Telephone Encounter (Signed)
Just started the lisinopril  - seen DR Evette Doffing a few days ago.  He needs a eye appt === scheduled for MON  Afternoon   he has a bad HA  -- x 1 week -- cant shake it   Any other recommendations --???    PT was made a appt for today

## 2017-11-24 ENCOUNTER — Encounter: Payer: Self-pay | Admitting: Family Medicine

## 2017-11-25 ENCOUNTER — Other Ambulatory Visit: Payer: Self-pay | Admitting: Family Medicine

## 2017-11-25 DIAGNOSIS — G4489 Other headache syndrome: Secondary | ICD-10-CM

## 2017-11-25 MED ORDER — BUTALBITAL-APAP-CAFF-COD 50-325-40-30 MG PO CAPS: 1.0000 | ORAL_CAPSULE | ORAL | 0 refills | Status: DC | PRN

## 2017-11-26 ENCOUNTER — Encounter: Payer: Self-pay | Admitting: Family Medicine

## 2017-11-26 ENCOUNTER — Telehealth: Payer: Self-pay | Admitting: Family Medicine

## 2017-11-26 ENCOUNTER — Other Ambulatory Visit: Payer: Self-pay | Admitting: Family Medicine

## 2017-11-26 DIAGNOSIS — G4452 New daily persistent headache (NDPH): Secondary | ICD-10-CM

## 2017-11-29 ENCOUNTER — Other Ambulatory Visit: Payer: Self-pay | Admitting: Family Medicine

## 2017-12-02 ENCOUNTER — Ambulatory Visit: Payer: 59 | Admitting: Physician Assistant

## 2017-12-03 ENCOUNTER — Ambulatory Visit: Payer: 59 | Admitting: Family Medicine

## 2017-12-03 ENCOUNTER — Encounter: Payer: Self-pay | Admitting: Family Medicine

## 2017-12-03 VITALS — BP 169/85 | HR 61 | Temp 98.1°F | Ht 70.5 in | Wt 181.4 lb

## 2017-12-03 DIAGNOSIS — R519 Headache, unspecified: Secondary | ICD-10-CM

## 2017-12-03 DIAGNOSIS — I10 Essential (primary) hypertension: Secondary | ICD-10-CM

## 2017-12-03 DIAGNOSIS — R51 Headache: Secondary | ICD-10-CM | POA: Diagnosis not present

## 2017-12-03 MED ORDER — AMLODIPINE BESYLATE 10 MG PO TABS: 10.0000 mg | ORAL_TABLET | Freq: Every day | ORAL | 1 refills | Status: DC

## 2017-12-03 NOTE — Progress Notes (Signed)
Subjective:  Patient ID: Daniel [name], male    DOB: 07/22/1956  Age: 62 y.o. MRN: [id number]  CC: Hypertension (pt here today for high BP and headaches)   HPI Daniel [name] presents for  follow-up of hypertension. Patient has no history of headache chest pain or shortness of breath or recent cough. Patient also denies symptoms of TIA such as focal numbness or weakness.  Pressure readings taken between office visits have been rather high as well.. Patient denies side effects from medication. States taking it regularly.  Headache continues but has been somewhat less intense.  He did have several days of relief but they have started to recur daily just less intense and less lengthy.  Sometimes lasting just an hour or 2 at a 3-4 possibly 5 level of pain.  Additionally he notes some leg cramping primarily at night and wonders if he has a problem with his electrolytes.   History Daniel [name] has a past medical history of BPH (benign prostatic hyperplasia), COPD (chronic obstructive pulmonary disease) ([place]), Hyperlipidemia, and Pulmonary nodule.   He has a past surgical history that includes No past surgeries.   His family history includes Cancer in his father; Emphysema in his father; Lung cancer in his mother.He reports that he has been smoking cigarettes.  He has a 42.00 pack-year smoking history. he has never used smokeless tobacco. He reports that he drinks about 7.0 oz of alcohol per week. His drug history is not on file.  Current Outpatient Medications on File Prior to Visit  Medication Sig Dispense Refill  . ADVAIR DISKUS 250-50 MCG/DOSE AEPB TAKE 1 PUFF BY MOUTH TWICE A DAY 60 each 5  . albuterol (PROVENTIL HFA;VENTOLIN HFA) 108 (90 Base) MCG/ACT inhaler Inhale 2 puffs into the lungs every 6 (six) hours as needed for wheezing or shortness of breath. 1 Inhaler 2  . augmented betamethasone dipropionate (DIPROLENE-AF) 0.05 % ointment APPLY TOPICALLY 2 (TWO) TIMES DAILY. 50 g 0  .  butalbital-acetaminophen-caffeine (FIORICET/CODEINE) 50-325-40-30 MG capsule Take 1 capsule by mouth every 4 (four) hours as needed for headache. 30 capsule 0  . FLUZONE QUADRIVALENT 0.5 ML injection TO BE ADMINISTERED BY PHARMACIST FOR IMMUNIZATION  0   No current facility-administered medications on file prior to visit.     ROS Review of Systems  Constitutional: Negative for chills, diaphoresis and fever.  HENT: Negative for rhinorrhea and sore throat.   Respiratory: Negative for cough and shortness of breath.   Cardiovascular: Negative for chest pain.  Gastrointestinal: Negative for abdominal pain.  Musculoskeletal: Negative for arthralgias and myalgias.  Skin: Negative for rash.  Neurological: Negative for weakness and headaches.    Objective:  BP (!) 169/85   Pulse 61   Temp 98.1 F (36.7 C) (Oral)   Ht 5' 10.5" (1.791 m)   Wt 181 lb 6 oz (82.3 kg)   BMI 25.66 kg/m   BP Readings from Last 3 Encounters:  12/03/17 (!) 169/85  11/20/17 (!) 148/83  11/18/17 (!) 162/94    Wt Readings from Last 3 Encounters:  12/03/17 181 lb 6 oz (82.3 kg)  11/20/17 188 lb (85.3 kg)  11/18/17 185 lb 6.4 oz (84.1 kg)     Physical Exam  Constitutional: He appears well-developed and well-nourished.  HENT:  Head: Normocephalic and atraumatic.  Right Ear: Tympanic membrane and external ear normal. No decreased hearing is noted.  Left Ear: Tympanic membrane and external ear normal. No decreased hearing is noted.  Mouth/Throat: No oropharyngeal exudate or posterior oropharyngeal  erythema.  Eyes: Pupils are equal, round, and reactive to light.  Neck: Normal range of motion. Neck supple.  Cardiovascular: Normal rate and regular rhythm.  No murmur heard. Pulmonary/Chest: Breath sounds normal. No respiratory distress.  Abdominal: Soft. Bowel sounds are normal. He exhibits no mass. There is no tenderness.  Vitals reviewed.     Assessment & Plan:   Daniel [name] was seen today for  hypertension.  Diagnoses and all orders for this visit:  Nonintractable headache, unspecified chronicity pattern, unspecified headache type -     Magnesium -     Phosphorus -     CMP14+EGFR  Essential hypertension -     Magnesium -     Phosphorus -     CMP14+EGFR  Other orders -     amLODipine (NORVASC) 10 MG tablet; Take 1 tablet (10 mg total) by mouth daily.   Allergies as of 12/03/2017   No Known Allergies     Medication List        Accurate as of 12/03/17 11:59 PM. Always use your most recent med list.          ADVAIR DISKUS 250-50 MCG/DOSE Aepb Generic drug:  Fluticasone-Salmeterol TAKE 1 PUFF BY MOUTH TWICE A DAY   albuterol 108 (90 Base) MCG/ACT inhaler Commonly known as:  PROVENTIL HFA;VENTOLIN HFA Inhale 2 puffs into the lungs every 6 (six) hours as needed for wheezing or shortness of breath.   amLODipine 10 MG tablet Commonly known as:  NORVASC Take 1 tablet (10 mg total) by mouth daily.   augmented betamethasone dipropionate 0.05 % ointment Commonly known as:  DIPROLENE-AF APPLY TOPICALLY 2 (TWO) TIMES DAILY.   butalbital-acetaminophen-caffeine 50-325-40-30 MG capsule Commonly known as:  FIORICET/CODEINE Take 1 capsule by mouth every 4 (four) hours as needed for headache.   FLUZONE QUADRIVALENT 0.5 ML injection Generic drug:  Influenza vac split quadrivalent PF TO BE ADMINISTERED BY PHARMACIST FOR IMMUNIZATION       Meds ordered this encounter  Medications  . amLODipine (NORVASC) 10 MG tablet    Sig: Take 1 tablet (10 mg total) by mouth daily.    Dispense:  30 tablet    Refill:  1    Electrolytes to be performed to determine metabolic source for the leg cramping.  Blood pressure medicine adjusted upward.  Follow-up: Return in about 1 month (around 12/31/2017).  [name], M.D.

## 2017-12-04 LAB — CMP14+EGFR
ALT: 23 IU/L (ref 0–44)
AST: 23 IU/L (ref 0–40)
Albumin/Globulin Ratio: 2.1 (ref 1.2–2.2)
Albumin: 4.9 g/dL — ABNORMAL HIGH (ref 3.6–4.8)
Alkaline Phosphatase: 79 IU/L (ref 39–117)
BUN/Creatinine Ratio: 15 (ref 10–24)
BUN: 18 mg/dL (ref 8–27)
Bilirubin Total: 0.5 mg/dL (ref 0.0–1.2)
CO2: 22 mmol/L (ref 20–29)
Calcium: 9.4 mg/dL (ref 8.6–10.2)
Chloride: 100 mmol/L (ref 96–106)
Creatinine, Ser: 1.24 mg/dL (ref 0.76–1.27)
GFR calc Af Amer: 72 mL/min/{1.73_m2} (ref >59)
GFR calc non Af Amer: 62 mL/min/{1.73_m2} (ref >59)
Globulin, Total: 2.3 g/dL (ref 1.5–4.5)
Glucose: 98 mg/dL (ref 65–99)
Potassium: 5.1 mmol/L (ref 3.5–5.2)
Sodium: 139 mmol/L (ref 134–144)
Total Protein: 7.2 g/dL (ref 6.0–8.5)

## 2017-12-04 LAB — PHOSPHORUS: Phosphorus: 3.3 mg/dL (ref 2.5–4.5)

## 2017-12-04 LAB — MAGNESIUM: Magnesium: 2.2 mg/dL (ref 1.6–2.3)

## 2017-12-07 ENCOUNTER — Encounter: Payer: Self-pay | Admitting: Family Medicine

## 2017-12-13 ENCOUNTER — Ambulatory Visit
Admission: RE | Admit: 2017-12-13 | Discharge: 2017-12-13 | Disposition: A | Discharge status: Home / Self Care | Payer: 59 | Source: Ambulatory Visit | Attending: Family Medicine | Admitting: Family Medicine

## 2017-12-13 DIAGNOSIS — G4452 New daily persistent headache (NDPH): Secondary | ICD-10-CM

## 2017-12-13 DIAGNOSIS — R51 Headache: Secondary | ICD-10-CM | POA: Diagnosis not present

## 2017-12-14 ENCOUNTER — Encounter: Payer: Self-pay | Admitting: Family Medicine

## 2017-12-15 ENCOUNTER — Other Ambulatory Visit: Payer: Self-pay | Admitting: Family Medicine

## 2017-12-15 ENCOUNTER — Encounter: Payer: Self-pay | Admitting: Family Medicine

## 2017-12-15 DIAGNOSIS — I10 Essential (primary) hypertension: Secondary | ICD-10-CM

## 2017-12-22 ENCOUNTER — Other Ambulatory Visit: Payer: Self-pay | Admitting: Family Medicine

## 2017-12-22 ENCOUNTER — Encounter (HOSPITAL_COMMUNITY)
Admission: RE | Admit: 2017-12-22 | Discharge: 2017-12-22 | Disposition: A | Discharge status: Home / Self Care | Payer: 59 | Source: Ambulatory Visit | Attending: Family Medicine | Admitting: Family Medicine

## 2017-12-22 DIAGNOSIS — I10 Essential (primary) hypertension: Secondary | ICD-10-CM

## 2017-12-23 ENCOUNTER — Encounter (HOSPITAL_COMMUNITY): Payer: Self-pay

## 2017-12-23 ENCOUNTER — Encounter (HOSPITAL_COMMUNITY)
Admission: RE | Admit: 2017-12-23 | Discharge: 2017-12-23 | Disposition: A | Discharge status: Home / Self Care | Payer: 59 | Source: Ambulatory Visit | Attending: Family Medicine | Admitting: Family Medicine

## 2017-12-23 DIAGNOSIS — I10 Essential (primary) hypertension: Secondary | ICD-10-CM | POA: Diagnosis not present

## 2017-12-23 DIAGNOSIS — I150 Renovascular hypertension: Secondary | ICD-10-CM | POA: Diagnosis not present

## 2017-12-23 HISTORY — DX: Essential (primary) hypertension: I10

## 2017-12-23 MED ORDER — TECHNETIUM TC 99M MERTIATIDE
5.0000 | Freq: Once | INTRAVENOUS | Status: AC | PRN
  Administered 2017-12-23: 4.9 via INTRAVENOUS

## 2017-12-23 MED ORDER — TECHNETIUM TC 99M MERTIATIDE
2.0000 | Freq: Once | INTRAVENOUS | Status: AC | PRN
  Administered 2017-12-23: 2 via INTRAVENOUS

## 2017-12-23 MED ORDER — ENALAPRILAT 1.25 MG/ML IV SOLN
2.5000 mg | Freq: Once | INTRAVENOUS | Status: AC
  Administered 2017-12-23: 2.5 mg via INTRAVENOUS
  Filled 2017-12-23: qty 2

## 2017-12-31 ENCOUNTER — Encounter: Payer: Self-pay | Admitting: Family Medicine

## 2017-12-31 ENCOUNTER — Ambulatory Visit: Payer: 59 | Admitting: Family Medicine

## 2017-12-31 VITALS — BP 168/96 | HR 88 | Temp 98.1°F | Ht 70.5 in | Wt 175.0 lb

## 2017-12-31 DIAGNOSIS — I150 Renovascular hypertension: Secondary | ICD-10-CM

## 2017-12-31 DIAGNOSIS — N400 Enlarged prostate without lower urinary tract symptoms: Secondary | ICD-10-CM

## 2017-12-31 MED ORDER — CLONIDINE HCL 0.1 MG PO TABS: 0.1000 mg | ORAL_TABLET | Freq: Two times a day (BID) | ORAL | 3 refills | Status: DC

## 2017-12-31 NOTE — Progress Notes (Signed)
Subjective:  Patient ID: Daniel [name], male    DOB: 13-Mar-1956  Age: 62 y.o. MRN: [id number]  CC: Follow-up (pt here today for 1 month follow up and also to go over some results)   HPI Daniel [name] presents for  follow-up of hypertension. Patient has no history of headache chest pain or shortness of breath or recent cough. Patient also denies symptoms of TIA such as focal numbness or weakness.  Pt. Also in for review of Renal flow scan result.  History Daniel [name] has a past medical history of BPH (benign prostatic hyperplasia), COPD (chronic obstructive pulmonary disease) ([place]), Hyperlipidemia, Hypertension, and Pulmonary nodule.   He has a past surgical history that includes No past surgeries.   His family history includes Cancer in his father; Emphysema in his father; Lung cancer in his mother.He reports that he has been smoking cigarettes.  He has a 42.00 pack-year smoking history. he has never used smokeless tobacco. He reports that he drinks about 7.0 oz of alcohol per week. His drug history is not on file.  Current Outpatient Medications on File Prior to Visit  Medication Sig Dispense Refill  . ADVAIR DISKUS 250-50 MCG/DOSE AEPB TAKE 1 PUFF BY MOUTH TWICE A DAY 60 each 5  . albuterol (PROVENTIL HFA;VENTOLIN HFA) 108 (90 Base) MCG/ACT inhaler Inhale 2 puffs into the lungs every 6 (six) hours as needed for wheezing or shortness of breath. 1 Inhaler 2  . amLODipine (NORVASC) 10 MG tablet Take 1 tablet (10 mg total) by mouth daily. 30 tablet 1  . augmented betamethasone dipropionate (DIPROLENE-AF) 0.05 % ointment APPLY TOPICALLY 2 (TWO) TIMES DAILY. 50 g 0  . butalbital-acetaminophen-caffeine (FIORICET/CODEINE) 50-325-40-30 MG capsule Take 1 capsule by mouth every 4 (four) hours as needed for headache. 30 capsule 0   No current facility-administered medications on file prior to visit.     ROS Review of Systems noncontributory Objective:  BP (!) 168/96   Pulse 88   Temp  98.1 F (36.7 C) (Oral)   Ht 5' 10.5" (1.791 m)   Wt 175 lb (79.4 kg)   BMI 24.75 kg/m   BP Readings from Last 3 Encounters:  12/31/17 (!) 168/96  12/23/17 139/80  12/03/17 (!) 169/85    Wt Readings from Last 3 Encounters:  12/31/17 175 lb (79.4 kg)  12/03/17 181 lb 6 oz (82.3 kg)  11/20/17 188 lb (85.3 kg)     Physical Exam  Constitutional: He is oriented to person, place, and time. He appears well-developed. No distress.  HENT:  Head: Normocephalic and atraumatic.  Neck: Normal range of motion.  Cardiovascular: Normal rate and regular rhythm.  Pulmonary/Chest: Breath sounds normal.  Musculoskeletal: Normal range of motion.  Neurological: He is alert and oriented to person, place, and time.  Skin: Skin is warm and dry.  Psychiatric: He has a normal mood and affect. His behavior is normal. Thought content normal.      Assessment & Plan:   Tallen was seen today for follow-up.  Diagnoses and all orders for this visit:  Renovascular hypertension -     CMP14+EGFR -     Magnesium -     Prolactin -     Phosphorus -     Ambulatory referral to Urology  Benign prostatic hyperplasia, unspecified whether lower urinary tract symptoms present -     PSA, total and free  Other orders -     cloNIDine (CATAPRES) 0.1 MG tablet; Take 1 tablet (0.1 mg total) by mouth 2 (  two) times daily.   Allergies as of 12/31/2017   No Known Allergies     Medication List        Accurate as of 12/31/17 11:59 PM. Always use your most recent med list.          ADVAIR DISKUS 250-50 MCG/DOSE Aepb Generic drug:  Fluticasone-Salmeterol TAKE 1 PUFF BY MOUTH TWICE A DAY   albuterol 108 (90 Base) MCG/ACT inhaler Commonly known as:  PROVENTIL HFA;VENTOLIN HFA Inhale 2 puffs into the lungs every 6 (six) hours as needed for wheezing or shortness of breath.   amLODipine 10 MG tablet Commonly known as:  NORVASC Take 1 tablet (10 mg total) by mouth daily.   augmented betamethasone  dipropionate 0.05 % ointment Commonly known as:  DIPROLENE-AF APPLY TOPICALLY 2 (TWO) TIMES DAILY.   butalbital-acetaminophen-caffeine 50-325-40-30 MG capsule Commonly known as:  FIORICET/CODEINE Take 1 capsule by mouth every 4 (four) hours as needed for headache.   cloNIDine 0.1 MG tablet Commonly known as:  CATAPRES Take 1 tablet (0.1 mg total) by mouth 2 (two) times daily.       Meds ordered this encounter  Medications  . cloNIDine (CATAPRES) 0.1 MG tablet    Sig: Take 1 tablet (0.1 mg total) by mouth 2 (two) times daily.    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   Follow-up: Return in about 6 weeks (around 02/11/2018).  [name], M.D.

## 2018-01-01 LAB — CMP14+EGFR
ALT: 21 IU/L (ref 0–44)
AST: 25 IU/L (ref 0–40)
Albumin/Globulin Ratio: 1.7 (ref 1.2–2.2)
Albumin: 4.7 g/dL (ref 3.6–4.8)
Alkaline Phosphatase: 81 IU/L (ref 39–117)
BUN/Creatinine Ratio: 11 (ref 10–24)
BUN: 13 mg/dL (ref 8–27)
Bilirubin Total: 0.7 mg/dL (ref 0.0–1.2)
CO2: 22 mmol/L (ref 20–29)
Calcium: 9.4 mg/dL (ref 8.6–10.2)
Chloride: 100 mmol/L (ref 96–106)
Creatinine, Ser: 1.2 mg/dL (ref 0.76–1.27)
GFR calc Af Amer: 75 mL/min/{1.73_m2} (ref >59)
GFR calc non Af Amer: 65 mL/min/{1.73_m2} (ref >59)
Globulin, Total: 2.7 g/dL (ref 1.5–4.5)
Glucose: 88 mg/dL (ref 65–99)
Potassium: 4.8 mmol/L (ref 3.5–5.2)
Sodium: 137 mmol/L (ref 134–144)
Total Protein: 7.4 g/dL (ref 6.0–8.5)

## 2018-01-01 LAB — PSA, TOTAL AND FREE
PSA, Free Pct: 35 %
PSA, Free: 0.07 ng/mL
Prostate Specific Ag, Serum: 0.2 ng/mL (ref 0.0–4.0)

## 2018-01-01 LAB — PROLACTIN: Prolactin: 9.7 ng/mL (ref 4.0–15.2)

## 2018-01-01 LAB — PHOSPHORUS: Phosphorus: 3.4 mg/dL (ref 2.5–4.5)

## 2018-01-01 LAB — MAGNESIUM: Magnesium: 2.1 mg/dL (ref 1.6–2.3)

## 2018-01-03 ENCOUNTER — Encounter: Payer: Self-pay | Admitting: Family Medicine

## 2018-01-03 ENCOUNTER — Telehealth: Payer: Self-pay | Admitting: Family Medicine

## 2018-01-03 ENCOUNTER — Other Ambulatory Visit: Payer: Self-pay | Admitting: Family Medicine

## 2018-01-03 DIAGNOSIS — I150 Renovascular hypertension: Secondary | ICD-10-CM

## 2018-01-03 NOTE — Telephone Encounter (Signed)
Patient was seen on 12/31/16 and prescribed Clonidine for his BP.  He was already taking Amlodipine.  Do you want him to continue the Amlodipine with the Clonidine?

## 2018-01-03 NOTE — Telephone Encounter (Signed)
Pt aware.

## 2018-01-03 NOTE — Telephone Encounter (Signed)
Yes. He needs both. Thanks, WS

## 2018-01-11 NOTE — Addendum Note (Signed)
Encounter addended by: Mickie Bail A on: 01/11/2018 8:04 AM  Actions taken: Imaging Exam ended, Charge Capture section accepted

## 2018-01-12 DIAGNOSIS — I10 Essential (primary) hypertension: Secondary | ICD-10-CM | POA: Diagnosis not present

## 2018-01-31 ENCOUNTER — Ambulatory Visit: Payer: 59 | Admitting: Family Medicine

## 2018-02-01 ENCOUNTER — Other Ambulatory Visit: Payer: Self-pay | Admitting: Family Medicine

## 2018-02-07 ENCOUNTER — Ambulatory Visit: Payer: 59 | Admitting: Family Medicine

## 2018-02-07 ENCOUNTER — Encounter: Payer: Self-pay | Admitting: Family Medicine

## 2018-02-07 VITALS — BP 134/73 | HR 70 | Temp 98.4°F | Ht 70.5 in | Wt 171.0 lb

## 2018-02-07 DIAGNOSIS — I10 Essential (primary) hypertension: Secondary | ICD-10-CM | POA: Insufficient documentation

## 2018-02-07 DIAGNOSIS — N2889 Other specified disorders of kidney and ureter: Secondary | ICD-10-CM | POA: Diagnosis not present

## 2018-02-07 DIAGNOSIS — I151 Hypertension secondary to other renal disorders: Secondary | ICD-10-CM | POA: Diagnosis not present

## 2018-02-07 DIAGNOSIS — G4769 Other sleep related movement disorders: Secondary | ICD-10-CM

## 2018-02-07 MED ORDER — ROPINIROLE HCL 0.5 MG PO TABS: 0.5000 mg | ORAL_TABLET | Freq: Two times a day (BID) | ORAL | 0 refills | Status: DC

## 2018-02-07 NOTE — Progress Notes (Signed)
Subjective:  Patient ID: Daniel [name], male    DOB: 01/23/1956  Age: 62 y.o. MRN: [id number]  CC: Hypertension (pt here today following up on his HTN and has also seen the Nephrologist)   HPI Daniel [name] presents for  follow-up of hypertension. Patient has no history of headache chest pain or shortness of breath or recent cough. Patient also denies symptoms of TIA such as focal numbness or weakness. Patient denies side effects from medication.  The one exception is he has having quite a dry mouth that may be from the new medication chlorthalidone.  States taking it regularly.  Patient specifically states that he has had no headache for about 3-4 weeks now.  However he is having cramps in his hands and feet.  This can be rather severe at times particularly at night and it can keep him awake. Patient also saw Dr. Antionette Fairy in [facility] with [facility] nephrology.  His impression was reviewed along with the rest of his note stating that he felt that the threshold for revascularization should be very high based on recent evidence showing high risks associated with the procedure.  He states that in the absence of clear deterioration of renal function that the best approach is to aggressively treat his blood pressure with multiple medications.  So that he added chlorthalidone to the current regimen that had already been prescribed here using amlodipine 10 mg daily and clonidine 0.1 mg twice daily.  History Daniel [name] has a past medical history of BPH (benign prostatic hyperplasia), COPD (chronic obstructive pulmonary disease) ([place]), Hyperlipidemia, Hypertension, and Pulmonary nodule.   He has a past surgical history that includes No past surgeries.   His family history includes Cancer in his father; Emphysema in his father; Lung cancer in his mother.He reports that he has been smoking cigarettes.  He has a 42.00 pack-year smoking history. He has never used smokeless tobacco. He reports that he  drinks about 7.0 oz of alcohol per week. His drug history is not on file.  Current Outpatient Medications on File Prior to Visit  Medication Sig Dispense Refill  . ADVAIR DISKUS 250-50 MCG/DOSE AEPB TAKE 1 PUFF BY MOUTH TWICE A DAY 60 each 5  . albuterol (PROVENTIL HFA;VENTOLIN HFA) 108 (90 Base) MCG/ACT inhaler Inhale 2 puffs into the lungs every 6 (six) hours as needed for wheezing or shortness of breath. 1 Inhaler 2  . amLODipine (NORVASC) 10 MG tablet TAKE 1 TABLET BY MOUTH EVERY DAY 90 tablet 0  . augmented betamethasone dipropionate (DIPROLENE-AF) 0.05 % ointment APPLY TOPICALLY 2 (TWO) TIMES DAILY. 50 g 0  . butalbital-acetaminophen-caffeine (FIORICET/CODEINE) 50-325-40-30 MG capsule Take 1 capsule by mouth every 4 (four) hours as needed for headache. 30 capsule 0  . chlorthalidone (HYGROTON) 25 MG tablet TAKE ONE TABLET (25 MG DOSE) BY MOUTH DAILY.  6  . cloNIDine (CATAPRES) 0.1 MG tablet Take 1 tablet (0.1 mg total) by mouth 2 (two) times daily. 60 tablet 3   No current facility-administered medications on file prior to visit.     ROS Review of Systems  Constitutional: Negative.   HENT: Negative.   Eyes: Negative for visual disturbance.  Respiratory: Negative for cough and shortness of breath.   Cardiovascular: Negative for chest pain and leg swelling.  Gastrointestinal: Negative for abdominal pain, diarrhea, nausea and vomiting.  Genitourinary: Negative for difficulty urinating.  Musculoskeletal: Negative for arthralgias and myalgias.  Skin: Negative for rash.  Neurological: Negative for headaches.  Psychiatric/Behavioral: Negative for sleep disturbance.  Objective:  BP 134/73   Pulse 70   Temp 98.4 F (36.9 C) (Oral)   Ht 5' 10.5" (1.791 m)   Wt 171 lb (77.6 kg)   BMI 24.19 kg/m   BP Readings from Last 3 Encounters:  02/07/18 134/73  12/31/17 (!) 168/96  12/23/17 139/80    Wt Readings from Last 3 Encounters:  02/07/18 171 lb (77.6 kg)  12/31/17 175 lb  (79.4 kg)  12/03/17 181 lb 6 oz (82.3 kg)     Physical Exam  Constitutional: He is oriented to person, place, and time. He appears well-developed and well-nourished. No distress.  HENT:  Head: Normocephalic and atraumatic.  Right Ear: External ear normal.  Left Ear: External ear normal.  Nose: Nose normal.  Mouth/Throat: Oropharynx is clear and moist.  Eyes: Pupils are equal, round, and reactive to light. Conjunctivae and EOM are normal.  Neck: Normal range of motion. Neck supple. No thyromegaly present.  Cardiovascular: Normal rate, regular rhythm and normal heart sounds.  No murmur heard. Pulmonary/Chest: Effort normal and breath sounds normal. No respiratory distress. He has no wheezes. He has no rales.  Abdominal: Soft. Bowel sounds are normal. He exhibits no distension. There is no tenderness.  Lymphadenopathy:    He has no cervical adenopathy.  Neurological: He is alert and oriented to person, place, and time. He has normal reflexes.  Skin: Skin is warm and dry.  Psychiatric: He has a normal mood and affect. His behavior is normal. Judgment and thought content normal.      Assessment & Plan:   Daniel [name] was seen today for hypertension.  Diagnoses and all orders for this visit:  Nocturnal myoclonus -     rOPINIRole (REQUIP) 0.5 MG tablet; Take 1 tablet (0.5 mg total) by mouth 2 (two) times daily.  Hypertension secondary to other renal disorders -     CMP14+EGFR   Allergies as of 02/07/2018   No Known Allergies     Medication List        Accurate as of 02/07/18  6:39 PM. Always use your most recent med list.          ADVAIR DISKUS 250-50 MCG/DOSE Aepb Generic drug:  Fluticasone-Salmeterol TAKE 1 PUFF BY MOUTH TWICE A DAY   albuterol 108 (90 Base) MCG/ACT inhaler Commonly known as:  PROVENTIL HFA;VENTOLIN HFA Inhale 2 puffs into the lungs every 6 (six) hours as needed for wheezing or shortness of breath.   amLODipine 10 MG tablet Commonly known as:   NORVASC TAKE 1 TABLET BY MOUTH EVERY DAY   augmented betamethasone dipropionate 0.05 % ointment Commonly known as:  DIPROLENE-AF APPLY TOPICALLY 2 (TWO) TIMES DAILY.   butalbital-acetaminophen-caffeine 50-325-40-30 MG capsule Commonly known as:  FIORICET/CODEINE Take 1 capsule by mouth every 4 (four) hours as needed for headache.   chlorthalidone 25 MG tablet Commonly known as:  HYGROTON TAKE ONE TABLET (25 MG DOSE) BY MOUTH DAILY.   cloNIDine 0.1 MG tablet Commonly known as:  CATAPRES Take 1 tablet (0.1 mg total) by mouth 2 (two) times daily.   rOPINIRole 0.5 MG tablet Commonly known as:  REQUIP Take 1 tablet (0.5 mg total) by mouth 2 (two) times daily.       Meds ordered this encounter  Medications  . rOPINIRole (REQUIP) 0.5 MG tablet    Sig: Take 1 tablet (0.5 mg total) by mouth 2 (two) times daily.    Dispense:  60 tablet    Refill:  0    For the  dry mouth for now he should just be sure he drinks this much water as possible.  I encouraged him to keep his appointment with Dr. Antionette Fairy for follow-up in 2 days.  Of note is that plasma renin activity was ordered by Dr. Antionette Fairy.  Patient was again cautioned about smoking and continues to decline treatment.  Follow-up: Return in about 1 month (around 03/09/2018).  [name], M.D.

## 2018-02-08 LAB — CMP14+EGFR
ALT: 22 IU/L (ref 0–44)
AST: 28 IU/L (ref 0–40)
Albumin/Globulin Ratio: 2.3 — ABNORMAL HIGH (ref 1.2–2.2)
Albumin: 4.8 g/dL (ref 3.6–4.8)
Alkaline Phosphatase: 78 IU/L (ref 39–117)
BUN/Creatinine Ratio: 14 (ref 10–24)
BUN: 16 mg/dL (ref 8–27)
Bilirubin Total: 1.3 mg/dL — ABNORMAL HIGH (ref 0.0–1.2)
CO2: 25 mmol/L (ref 20–29)
Calcium: 9.3 mg/dL (ref 8.6–10.2)
Chloride: 97 mmol/L (ref 96–106)
Creatinine, Ser: 1.16 mg/dL (ref 0.76–1.27)
GFR calc Af Amer: 78 mL/min/{1.73_m2} (ref >59)
GFR calc non Af Amer: 68 mL/min/{1.73_m2} (ref >59)
Globulin, Total: 2.1 g/dL (ref 1.5–4.5)
Glucose: 85 mg/dL (ref 65–99)
Potassium: 4 mmol/L (ref 3.5–5.2)
Sodium: 137 mmol/L (ref 134–144)
Total Protein: 6.9 g/dL (ref 6.0–8.5)

## 2018-02-09 DIAGNOSIS — I10 Essential (primary) hypertension: Secondary | ICD-10-CM | POA: Diagnosis not present

## 2018-03-02 DIAGNOSIS — I150 Renovascular hypertension: Secondary | ICD-10-CM | POA: Diagnosis not present

## 2018-03-02 DIAGNOSIS — I701 Atherosclerosis of renal artery: Secondary | ICD-10-CM | POA: Diagnosis not present

## 2018-03-09 ENCOUNTER — Ambulatory Visit: Payer: 59 | Admitting: Family Medicine

## 2018-03-10 ENCOUNTER — Other Ambulatory Visit: Payer: Self-pay

## 2018-03-10 ENCOUNTER — Observation Stay (HOSPITAL_COMMUNITY)
Admission: EM | Admit: 2018-03-10 | Discharge: 2018-03-12 | Disposition: A | Discharge status: Home / Self Care | Payer: 59 | Attending: Internal Medicine | Admitting: Internal Medicine

## 2018-03-10 ENCOUNTER — Emergency Department (HOSPITAL_COMMUNITY): Payer: 59

## 2018-03-10 ENCOUNTER — Encounter (HOSPITAL_COMMUNITY): Payer: Self-pay

## 2018-03-10 DIAGNOSIS — G459 Transient cerebral ischemic attack, unspecified: Principal | ICD-10-CM | POA: Insufficient documentation

## 2018-03-10 DIAGNOSIS — R531 Weakness: Secondary | ICD-10-CM

## 2018-03-10 DIAGNOSIS — F1010 Alcohol abuse, uncomplicated: Secondary | ICD-10-CM | POA: Diagnosis not present

## 2018-03-10 DIAGNOSIS — F17210 Nicotine dependence, cigarettes, uncomplicated: Secondary | ICD-10-CM | POA: Insufficient documentation

## 2018-03-10 DIAGNOSIS — Z79899 Other long term (current) drug therapy: Secondary | ICD-10-CM | POA: Diagnosis not present

## 2018-03-10 DIAGNOSIS — N179 Acute kidney failure, unspecified: Secondary | ICD-10-CM | POA: Insufficient documentation

## 2018-03-10 DIAGNOSIS — I10 Essential (primary) hypertension: Secondary | ICD-10-CM | POA: Insufficient documentation

## 2018-03-10 DIAGNOSIS — R202 Paresthesia of skin: Secondary | ICD-10-CM | POA: Diagnosis not present

## 2018-03-10 DIAGNOSIS — J449 Chronic obstructive pulmonary disease, unspecified: Secondary | ICD-10-CM | POA: Insufficient documentation

## 2018-03-10 DIAGNOSIS — G4489 Other headache syndrome: Secondary | ICD-10-CM

## 2018-03-10 DIAGNOSIS — I639 Cerebral infarction, unspecified: Secondary | ICD-10-CM

## 2018-03-10 LAB — DIFFERENTIAL
Basophils Absolute: 0 10*3/uL (ref 0.0–0.1)
Basophils Relative: 0 %
Eosinophils Absolute: 0.1 10*3/uL (ref 0.0–0.7)
Eosinophils Relative: 1 %
Lymphocytes Relative: 17 %
Lymphs Abs: 1.3 10*3/uL (ref 0.7–4.0)
Monocytes Absolute: 0.4 10*3/uL (ref 0.1–1.0)
Monocytes Relative: 6 %
Neutro Abs: 5.8 10*3/uL (ref 1.7–7.7)
Neutrophils Relative %: 76 %

## 2018-03-10 LAB — I-STAT CHEM 8, ED
BUN: 31 mg/dL — ABNORMAL HIGH (ref 6–20)
Calcium, Ion: 1.11 mmol/L — ABNORMAL LOW (ref 1.15–1.40)
Chloride: 101 mmol/L (ref 101–111)
Creatinine, Ser: 1.7 mg/dL — ABNORMAL HIGH (ref 0.61–1.24)
Glucose, Bld: 100 mg/dL — ABNORMAL HIGH (ref 65–99)
HCT: 43 % (ref 39.0–52.0)
Hemoglobin: 14.6 g/dL (ref 13.0–17.0)
Potassium: 3.6 mmol/L (ref 3.5–5.1)
Sodium: 139 mmol/L (ref 135–145)
TCO2: 25 mmol/L (ref 22–32)

## 2018-03-10 LAB — COMPREHENSIVE METABOLIC PANEL
ALT: 20 U/L (ref 17–63)
AST: 28 U/L (ref 15–41)
Albumin: 4.4 g/dL (ref 3.5–5.0)
Alkaline Phosphatase: 72 U/L (ref 38–126)
Anion gap: 12 (ref 5–15)
BUN: 29 mg/dL — ABNORMAL HIGH (ref 6–20)
CO2: 24 mmol/L (ref 22–32)
Calcium: 9.4 mg/dL (ref 8.9–10.3)
Chloride: 103 mmol/L (ref 101–111)
Creatinine, Ser: 1.82 mg/dL — ABNORMAL HIGH (ref 0.61–1.24)
GFR calc Af Amer: 44 mL/min — ABNORMAL LOW (ref >60)
GFR calc non Af Amer: 38 mL/min — ABNORMAL LOW (ref >60)
Glucose, Bld: 100 mg/dL — ABNORMAL HIGH (ref 65–99)
Potassium: 3.4 mmol/L — ABNORMAL LOW (ref 3.5–5.1)
Sodium: 139 mmol/L (ref 135–145)
Total Bilirubin: 1.3 mg/dL — ABNORMAL HIGH (ref 0.3–1.2)
Total Protein: 7 g/dL (ref 6.5–8.1)

## 2018-03-10 LAB — CBC
HCT: 41.2 % (ref 39.0–52.0)
Hemoglobin: 14.2 g/dL (ref 13.0–17.0)
MCH: 30.9 pg (ref 26.0–34.0)
MCHC: 34.5 g/dL (ref 30.0–36.0)
MCV: 89.8 fL (ref 78.0–100.0)
Platelets: 163 10*3/uL (ref 150–400)
RBC: 4.59 MIL/uL (ref 4.22–5.81)
RDW: 13.5 % (ref 11.5–15.5)
WBC: 7.5 10*3/uL (ref 4.0–10.5)

## 2018-03-10 LAB — I-STAT TROPONIN, ED: Troponin i, poc: 0 ng/mL (ref 0.00–0.08)

## 2018-03-10 LAB — PROTIME-INR
INR: 1
Prothrombin Time: 13.1 seconds (ref 11.4–15.2)

## 2018-03-10 LAB — APTT: aPTT: 28 seconds (ref 24–36)

## 2018-03-10 NOTE — ED Notes (Signed)
Pt's wife Suanne Marker would like to be notified with any updates. #[phone]. Pt's wife took home pt's belongings.

## 2018-03-10 NOTE — ED Notes (Signed)
Pt ambulatory in hallway independently without difficulty.

## 2018-03-10 NOTE — ED Triage Notes (Signed)
Pt states he was having trouble walking today starting about one hour ago. He was speaking on the phone and states he felt like something was pushing on him and he was off balance with numbness in bottom of right foot. Wife at bedside reports this happened at 4 pm and that pt was normal prior to this.

## 2018-03-10 NOTE — ED Notes (Signed)
ED Provider at bedside. 

## 2018-03-10 NOTE — ED Provider Notes (Signed)
[facility] EMERGENCY DEPARTMENT Provider Note   CSN: [id number] Arrival date & time: 03/10/18  1704     History   Chief Complaint Chief Complaint  Patient presents with  . Weakness    HPI Daniel [name] is a 62 y.o. male.  Patient with a history of HTN, renal artery stenosis, COPD, continuous tobacco use, daily alcohol, presents for evaluation of right arm and leg dysfunction that started around 4:00 pm today (03/10/18) described as a lack of coordination, "like my arm and leg wouldn't do what I told it to". No headache, speech difficulty, facial droop, fall or injury. Symptoms have presented and resolved x 3 since the start at 4:00 pm. He is currently asymptomatic. No recent medication changes. No recent illness, fever, nausea, vomiting. No history of stroke in the past.   The history is provided by the patient. No language interpreter was used.    Past Medical History:  Diagnosis Date  . BPH (benign prostatic hyperplasia)   . COPD (chronic obstructive pulmonary disease) ([place])   . Hyperlipidemia   . Hypertension   . Pulmonary nodule     Patient Active Problem List   Diagnosis Date Noted  . Hypertension 02/07/2018  . Cervical stenosis of spine 01/17/2016  . BPH (benign prostatic hyperplasia) 07/20/2013  . Abnormal chest CT 07/20/2013  . Hyperlipidemia 07/20/2013  . COPD (chronic obstructive pulmonary disease) ([place]) 12/08/2011  . Smoker 12/08/2011  . Multiple pulmonary nodules 12/08/2011    Past Surgical History:  Procedure Laterality Date  . NO PAST SURGERIES          Home Medications    Prior to Admission medications   Medication Sig Start Date End Date Taking? Authorizing Provider  ADVAIR DISKUS 250-50 MCG/DOSE AEPB TAKE 1 PUFF BY MOUTH TWICE A DAY 11/29/17  Yes Stacks, Cletus Gash, MD  albuterol (PROVENTIL HFA;VENTOLIN HFA) 108 (90 Base) MCG/ACT inhaler Inhale 2 puffs into the lungs every 6 (six) hours as needed for wheezing or shortness of  breath. 03/04/17  Yes Eustaquio Maize, MD  amLODipine (NORVASC) 10 MG tablet TAKE 1 TABLET BY MOUTH EVERY DAY 02/01/18  Yes [name], MD  butalbital-acetaminophen-caffeine (FIORICET/CODEINE) [phone] MG capsule Take 1 capsule by mouth every 4 (four) hours as needed for headache. 11/25/17  Yes Stacks, Cletus Gash, MD  chlorthalidone (HYGROTON) 25 MG tablet TAKE ONE TABLET (25 MG DOSE) BY MOUTH DAILY. 01/12/18  Yes [provider]  cloNIDine (CATAPRES) 0.1 MG tablet Take 1 tablet (0.1 mg total) by mouth 2 (two) times daily. Patient taking differently: Take 0.3 mg by mouth 3 (three) times daily.  12/31/17  Yes [name], MD    Family History Family History  Problem Relation Age of Onset  . Emphysema Father   . Cancer Father   . Lung cancer Mother     Social History Social History   Tobacco Use  . Smoking status: Current Every Day Smoker    Packs/day: 1.00    Years: 42.00    Pack years: 42.00    Types: Cigarettes  . Smokeless tobacco: Never Used  Substance Use Topics  . Alcohol use: Yes    Alcohol/week: 7.0 oz    Types: 14 Standard drinks or equivalent per week  . Drug use: Not on file     Allergies   Patient has no known allergies.   Review of Systems Review of Systems  Constitutional: Negative for chills and fever.  HENT: Negative.   Respiratory: Negative.   Cardiovascular: Negative.  Gastrointestinal: Negative.   Genitourinary: Negative.   Musculoskeletal: Negative.   Skin: Negative.   Neurological: Positive for weakness (See HPI.). Negative for syncope, facial asymmetry, speech difficulty and headaches.     Physical Exam Updated Vital Signs BP 131/73   Pulse (!) 46   Temp 98.1 F (36.7 C) (Oral)   Resp 12   Ht 5\' 10"  (1.778 m)   Wt 74.8 kg (165 lb)   SpO2 94%   BMI 23.68 kg/m   Physical Exam  Constitutional: He is oriented to person, place, and time. He appears well-developed and well-nourished.  HENT:  Head: Normocephalic.  Neck:  Normal range of motion. Neck supple. Carotid bruit is not present.  Cardiovascular: Normal rate and regular rhythm.  Pulmonary/Chest: Effort normal and breath sounds normal. He has no wheezes. He has no rales.  Abdominal: Soft. Bowel sounds are normal. There is no tenderness. There is no rebound and no guarding.  Musculoskeletal: Normal range of motion. He exhibits no edema.  Neurological: He is alert and oriented to person, place, and time. He has normal strength. No cranial nerve deficit or sensory deficit. He exhibits normal muscle tone. He displays a negative Romberg sign. Coordination (by heel-to-shin) and gait normal. GCS eye subscore is 4. GCS verbal subscore is 5. GCS motor subscore is 6.  Skin: Skin is warm and dry. No rash noted.  Psychiatric: He has a normal mood and affect.     ED Treatments / Results  Labs (all labs ordered are listed, but only abnormal results are displayed) Labs Reviewed  COMPREHENSIVE METABOLIC PANEL - Abnormal; Notable for the following components:      Result Value   Potassium 3.4 (*)    Glucose, Bld 100 (*)    BUN 29 (*)    Creatinine, Ser 1.82 (*)    Total Bilirubin 1.3 (*)    GFR calc non Af Amer 38 (*)    GFR calc Af Amer 44 (*)    All other components within normal limits  I-STAT CHEM 8, ED - Abnormal; Notable for the following components:   BUN 31 (*)    Creatinine, Ser 1.70 (*)    Glucose, Bld 100 (*)    Calcium, Ion 1.11 (*)    All other components within normal limits  PROTIME-INR  APTT  CBC  DIFFERENTIAL  I-STAT TROPONIN, ED  CBG MONITORING, ED   Results for orders placed or performed during the hospital encounter of 03/10/18  Protime-INR  Result Value Ref Range   Prothrombin Time 13.1 11.4 - 15.2 seconds   INR 1.00   APTT  Result Value Ref Range   aPTT 28 24 - 36 seconds  CBC  Result Value Ref Range   WBC 7.5 4.0 - 10.5 K/uL   RBC 4.59 4.22 - 5.81 MIL/uL   Hemoglobin 14.2 13.0 - 17.0 g/dL   HCT 41.2 39.0 - 52.0 %   MCV  89.8 78.0 - 100.0 fL   MCH 30.9 26.0 - 34.0 pg   MCHC 34.5 30.0 - 36.0 g/dL   RDW 13.5 11.5 - 15.5 %   Platelets 163 150 - 400 K/uL  Differential  Result Value Ref Range   Neutrophils Relative % 76 %   Neutro Abs 5.8 1.7 - 7.7 K/uL   Lymphocytes Relative 17 %   Lymphs Abs 1.3 0.7 - 4.0 K/uL   Monocytes Relative 6 %   Monocytes Absolute 0.4 0.1 - 1.0 K/uL   Eosinophils Relative 1 %  Eosinophils Absolute 0.1 0.0 - 0.7 K/uL   Basophils Relative 0 %   Basophils Absolute 0.0 0.0 - 0.1 K/uL  Comprehensive metabolic panel  Result Value Ref Range   Sodium 139 135 - 145 mmol/L   Potassium 3.4 (L) 3.5 - 5.1 mmol/L   Chloride 103 101 - 111 mmol/L   CO2 24 22 - 32 mmol/L   Glucose, Bld 100 (H) 65 - 99 mg/dL   BUN 29 (H) 6 - 20 mg/dL   Creatinine, Ser 1.82 (H) 0.61 - 1.24 mg/dL   Calcium 9.4 8.9 - 10.3 mg/dL   Total Protein 7.0 6.5 - 8.1 g/dL   Albumin 4.4 3.5 - 5.0 g/dL   AST 28 15 - 41 U/L   ALT 20 17 - 63 U/L   Alkaline Phosphatase 72 38 - 126 U/L   Total Bilirubin 1.3 (H) 0.3 - 1.2 mg/dL   GFR calc non Af Amer 38 (L) >60 mL/min   GFR calc Af Amer 44 (L) >60 mL/min   Anion gap 12 5 - 15  I-stat troponin, ED  Result Value Ref Range   Troponin i, poc 0.00 0.00 - 0.08 ng/mL   Comment 3          I-Stat Chem 8, ED  Result Value Ref Range   Sodium 139 135 - 145 mmol/L   Potassium 3.6 3.5 - 5.1 mmol/L   Chloride 101 101 - 111 mmol/L   BUN 31 (H) 6 - 20 mg/dL   Creatinine, Ser 1.70 (H) 0.61 - 1.24 mg/dL   Glucose, Bld 100 (H) 65 - 99 mg/dL   Calcium, Ion 1.11 (L) 1.15 - 1.40 mmol/L   TCO2 25 22 - 32 mmol/L   Hemoglobin 14.6 13.0 - 17.0 g/dL   HCT 43.0 39.0 - 52.0 %    EKG None  Radiology Ct Head Wo Contrast  Result Date: 03/10/2018 CLINICAL DATA:  Sudden onset stroke-like symptoms with weakness and numbness in the left hand and arm. EXAM: CT HEAD WITHOUT CONTRAST TECHNIQUE: Contiguous axial images were obtained from the base of the skull through the vertex without  intravenous contrast. COMPARISON:  12/13/2017 FINDINGS: Brain: Redemonstration of mild-to-moderate chronic appearing small vessel ischemic disease of periventricular white matter. No acute intracranial hemorrhage, large vascular territory infarct, intra-axial mass nor extra-axial fluid collections. Midline fourth ventricle basal cisterns. No encephalomalacia. Vascular: No hyperdense vessel sign. Calcified atherosclerosis at the skull base. Skull: Intact without suspicious osseous abnormality. Sinuses/Orbits: No acute finding. Other: None. IMPRESSION: Chronic stable mild-to-moderate small vessel ischemic disease. No significant change from prior. No acute intracranial abnormality is identified. Electronically Signed   By: Ashley Royalty M.D.   On: 03/10/2018 19:59    Procedures Procedures (including critical care time)  Medications Ordered in ED Medications - No data to display   Initial Impression / Assessment and Plan / ED Course  I have reviewed the triage vital signs and the nursing notes.  Pertinent labs & imaging results that were available during my care of the patient were reviewed by me and considered in my medical decision making (see chart for details).     Patient presents with wife with history of 3 episodes of right sided extremity deficit described as lost coordination. No facial droop, speech deficits. Symptom now resolved.   Discussed with Dr. Lorraine Lax who will provide consultation. Advised TIA admission with MRI/MRA brain. No CM due to patient's renal dysfunction - Cr. 1.70.   Patient and wife are updated with plan and  are agreeable to admission for further work up. Discussed with Dr[facility] Hal [facility], [facility], who accepts the patient for admission.  Final Clinical Impressions(s) / ED Diagnoses   Final diagnoses:  None   1. TIA  ED Discharge Orders    None       Charlann Lange, Hershal Coria 03/11/18 0000    Lacretia Leigh, MD 03/11/18 1651

## 2018-03-10 NOTE — ED Notes (Signed)
Pt. In room alert with family @ bedside, vital signs documented

## 2018-03-11 ENCOUNTER — Observation Stay (HOSPITAL_COMMUNITY): Payer: 59

## 2018-03-11 ENCOUNTER — Emergency Department (HOSPITAL_COMMUNITY): Payer: 59

## 2018-03-11 ENCOUNTER — Observation Stay (HOSPITAL_BASED_OUTPATIENT_CLINIC_OR_DEPARTMENT_OTHER): Payer: 59

## 2018-03-11 ENCOUNTER — Encounter (HOSPITAL_COMMUNITY): Payer: Self-pay | Admitting: Internal Medicine

## 2018-03-11 DIAGNOSIS — N179 Acute kidney failure, unspecified: Secondary | ICD-10-CM | POA: Diagnosis not present

## 2018-03-11 DIAGNOSIS — J449 Chronic obstructive pulmonary disease, unspecified: Secondary | ICD-10-CM | POA: Diagnosis not present

## 2018-03-11 DIAGNOSIS — G459 Transient cerebral ischemic attack, unspecified: Secondary | ICD-10-CM

## 2018-03-11 DIAGNOSIS — I639 Cerebral infarction, unspecified: Secondary | ICD-10-CM | POA: Diagnosis not present

## 2018-03-11 DIAGNOSIS — I10 Essential (primary) hypertension: Secondary | ICD-10-CM | POA: Diagnosis not present

## 2018-03-11 DIAGNOSIS — R531 Weakness: Secondary | ICD-10-CM

## 2018-03-11 DIAGNOSIS — F1010 Alcohol abuse, uncomplicated: Secondary | ICD-10-CM | POA: Diagnosis present

## 2018-03-11 HISTORY — DX: Transient cerebral ischemic attack, unspecified: G45.9

## 2018-03-11 LAB — COMPREHENSIVE METABOLIC PANEL
ALT: 18 U/L (ref 17–63)
AST: 23 U/L (ref 15–41)
Albumin: 4.1 g/dL (ref 3.5–5.0)
Alkaline Phosphatase: 65 U/L (ref 38–126)
Anion gap: 10 (ref 5–15)
BUN: 25 mg/dL — ABNORMAL HIGH (ref 6–20)
CO2: 28 mmol/L (ref 22–32)
Calcium: 9.3 mg/dL (ref 8.9–10.3)
Chloride: 99 mmol/L — ABNORMAL LOW (ref 101–111)
Creatinine, Ser: 1.56 mg/dL — ABNORMAL HIGH (ref 0.61–1.24)
GFR calc Af Amer: 53 mL/min — ABNORMAL LOW (ref >60)
GFR calc non Af Amer: 46 mL/min — ABNORMAL LOW (ref >60)
Glucose, Bld: 115 mg/dL — ABNORMAL HIGH (ref 65–99)
Potassium: 3 mmol/L — ABNORMAL LOW (ref 3.5–5.1)
Sodium: 137 mmol/L (ref 135–145)
Total Bilirubin: 1.7 mg/dL — ABNORMAL HIGH (ref 0.3–1.2)
Total Protein: 6.7 g/dL (ref 6.5–8.1)

## 2018-03-11 LAB — RAPID URINE DRUG SCREEN, HOSP PERFORMED
Amphetamines: NOT DETECTED
Barbiturates: NOT DETECTED
Benzodiazepines: NOT DETECTED
Cocaine: NOT DETECTED
Opiates: NOT DETECTED
Tetrahydrocannabinol: NOT DETECTED

## 2018-03-11 LAB — URINALYSIS, ROUTINE W REFLEX MICROSCOPIC
Bilirubin Urine: NEGATIVE
Glucose, UA: NEGATIVE mg/dL
Hgb urine dipstick: NEGATIVE
Ketones, ur: NEGATIVE mg/dL
Leukocytes, UA: NEGATIVE
Nitrite: NEGATIVE
Protein, ur: NEGATIVE mg/dL
Specific Gravity, Urine: 1.018 (ref 1.005–1.030)
pH: 5 (ref 5.0–8.0)

## 2018-03-11 LAB — CBC
HCT: 41.5 % (ref 39.0–52.0)
Hemoglobin: 14.3 g/dL (ref 13.0–17.0)
MCH: 31 pg (ref 26.0–34.0)
MCHC: 34.5 g/dL (ref 30.0–36.0)
MCV: 90 fL (ref 78.0–100.0)
Platelets: 159 10*3/uL (ref 150–400)
RBC: 4.61 MIL/uL (ref 4.22–5.81)
RDW: 13.6 % (ref 11.5–15.5)
WBC: 5.2 10*3/uL (ref 4.0–10.5)

## 2018-03-11 LAB — ECHOCARDIOGRAM COMPLETE
Height: 70 in
Weight: 2640 oz

## 2018-03-11 LAB — LIPID PANEL
Cholesterol: 196 mg/dL (ref 0–200)
HDL: 51 mg/dL (ref >40)
LDL Cholesterol: 134 mg/dL — ABNORMAL HIGH (ref 0–99)
Total CHOL/HDL Ratio: 3.8 RATIO
Triglycerides: 55 mg/dL (ref <150)
VLDL: 11 mg/dL (ref 0–40)

## 2018-03-11 LAB — TROPONIN I: Troponin I: <0.03 ng/mL (ref <0.03)

## 2018-03-11 LAB — HEMOGLOBIN A1C
Hgb A1c MFr Bld: 5.3 % (ref 4.8–5.6)
Mean Plasma Glucose: 105.41 mg/dL

## 2018-03-11 LAB — HIV ANTIBODY (ROUTINE TESTING W REFLEX): HIV Screen 4th Generation wRfx: NONREACTIVE

## 2018-03-11 LAB — SODIUM, URINE, RANDOM: Sodium, Ur: 96 mmol/L

## 2018-03-11 MED ORDER — ACETAMINOPHEN 325 MG PO TABS
650.0000 mg | ORAL_TABLET | ORAL | Status: DC | PRN
Start: 2018-03-11 — End: 2018-03-12
  Administered 2018-03-12 (×2): 650 mg via ORAL
  Filled 2018-03-11 (×2): qty 2

## 2018-03-11 MED ORDER — POTASSIUM CHLORIDE CRYS ER 20 MEQ PO TBCR
40.0000 meq | EXTENDED_RELEASE_TABLET | Freq: Once | ORAL | Status: AC
  Administered 2018-03-11: 40 meq via ORAL
  Filled 2018-03-11: qty 2

## 2018-03-11 MED ORDER — THIAMINE HCL 100 MG/ML IJ SOLN
100.0000 mg | Freq: Every day | INTRAMUSCULAR | Status: DC
  Filled 2018-03-11 (×2): qty 2

## 2018-03-11 MED ORDER — FOLIC ACID 1 MG PO TABS
1.0000 mg | ORAL_TABLET | Freq: Every day | ORAL | Status: DC
  Administered 2018-03-11 – 2018-03-12 (×2): 1 mg via ORAL
  Filled 2018-03-11 (×2): qty 1

## 2018-03-11 MED ORDER — ASPIRIN 325 MG PO TABS
325.0000 mg | ORAL_TABLET | Freq: Every day | ORAL | Status: DC
  Administered 2018-03-11: 325 mg via ORAL
  Filled 2018-03-11: qty 1

## 2018-03-11 MED ORDER — ADULT MULTIVITAMIN W/MINERALS CH
1.0000 | ORAL_TABLET | Freq: Every day | ORAL | Status: DC
  Administered 2018-03-11 – 2018-03-12 (×2): 1 via ORAL
  Filled 2018-03-11 (×2): qty 1

## 2018-03-11 MED ORDER — LORAZEPAM 1 MG PO TABS: 0.0000 mg | ORAL_TABLET | Freq: Four times a day (QID) | ORAL | Status: DC

## 2018-03-11 MED ORDER — ASPIRIN EC 81 MG PO TBEC
81.0000 mg | DELAYED_RELEASE_TABLET | Freq: Every day | ORAL | Status: DC
  Administered 2018-03-12: 81 mg via ORAL
  Filled 2018-03-11: qty 1

## 2018-03-11 MED ORDER — AMLODIPINE BESYLATE 10 MG PO TABS
10.0000 mg | ORAL_TABLET | Freq: Every day | ORAL | Status: DC
  Administered 2018-03-11 – 2018-03-12 (×2): 10 mg via ORAL
  Filled 2018-03-11 (×2): qty 1

## 2018-03-11 MED ORDER — ACETAMINOPHEN 650 MG RE SUPP: 650.0000 mg | RECTAL | Status: DC | PRN

## 2018-03-11 MED ORDER — STROKE: EARLY STAGES OF RECOVERY BOOK: Freq: Once | Status: DC

## 2018-03-11 MED ORDER — CLONIDINE HCL 0.1 MG PO TABS
0.3000 mg | ORAL_TABLET | Freq: Three times a day (TID) | ORAL | Status: DC
  Administered 2018-03-11: 0.3 mg via ORAL
  Filled 2018-03-11: qty 3

## 2018-03-11 MED ORDER — CLOPIDOGREL BISULFATE 75 MG PO TABS
75.0000 mg | ORAL_TABLET | Freq: Every day | ORAL | Status: DC
  Administered 2018-03-12: 75 mg via ORAL
  Filled 2018-03-11: qty 1

## 2018-03-11 MED ORDER — ASPIRIN 300 MG RE SUPP
300.0000 mg | Freq: Every day | RECTAL | Status: DC
  Filled 2018-03-11: qty 1

## 2018-03-11 MED ORDER — SODIUM CHLORIDE 0.9 % IV SOLN
INTRAVENOUS | Status: AC
  Administered 2018-03-11 (×2): via INTRAVENOUS

## 2018-03-11 MED ORDER — LORAZEPAM 1 MG PO TABS: 0.0000 mg | ORAL_TABLET | Freq: Two times a day (BID) | ORAL | Status: DC

## 2018-03-11 MED ORDER — ENOXAPARIN SODIUM 40 MG/0.4ML ~~LOC~~ SOLN
40.0000 mg | SUBCUTANEOUS | Status: DC
  Administered 2018-03-11 – 2018-03-12 (×2): 40 mg via SUBCUTANEOUS
  Filled 2018-03-11 (×2): qty 0.4

## 2018-03-11 MED ORDER — ATORVASTATIN CALCIUM 80 MG PO TABS
80.0000 mg | ORAL_TABLET | Freq: Every day | ORAL | Status: DC
  Administered 2018-03-11 – 2018-03-12 (×2): 80 mg via ORAL
  Filled 2018-03-11 (×3): qty 1

## 2018-03-11 MED ORDER — VITAMIN B-1 100 MG PO TABS
100.0000 mg | ORAL_TABLET | Freq: Every day | ORAL | Status: DC
  Administered 2018-03-11 – 2018-03-12 (×2): 100 mg via ORAL
  Filled 2018-03-11 (×2): qty 1

## 2018-03-11 MED ORDER — LORAZEPAM 1 MG PO TABS: 1.0000 mg | ORAL_TABLET | Freq: Four times a day (QID) | ORAL | Status: DC | PRN

## 2018-03-11 MED ORDER — CLOPIDOGREL BISULFATE 75 MG PO TABS
300.0000 mg | ORAL_TABLET | Freq: Once | ORAL | Status: AC
  Administered 2018-03-11: 300 mg via ORAL
  Filled 2018-03-11: qty 4

## 2018-03-11 MED ORDER — MOMETASONE FURO-FORMOTEROL FUM 200-5 MCG/ACT IN AERO
2.0000 | INHALATION_SPRAY | Freq: Two times a day (BID) | RESPIRATORY_TRACT | Status: DC
  Administered 2018-03-11 – 2018-03-12 (×3): 2 via RESPIRATORY_TRACT
  Filled 2018-03-11: qty 8.8

## 2018-03-11 MED ORDER — LORAZEPAM 2 MG/ML IJ SOLN: 1.0000 mg | Freq: Four times a day (QID) | INTRAMUSCULAR | Status: DC | PRN

## 2018-03-11 MED ORDER — ACETAMINOPHEN 160 MG/5ML PO SOLN: 650.0000 mg | ORAL | Status: DC | PRN

## 2018-03-11 NOTE — Progress Notes (Signed)
0315 Pt arrived via stretcher. Pt assessed, see flow sheet. No neuro deficits noted. Pt stated he can fell when the right sided weakness is about to happen. Charge RN to perform NIH assessment. Pt oriented to room and updated with POC. No complaints at this time, fall precautions in place, WCTM.

## 2018-03-11 NOTE — H&P (Addendum)
History and Physical    Daniel [name] NWG:[id number] DOB: June 27, 1956 DOA: 03/10/2018  PCP: [name], MD  Patient coming from: Home.  Chief Complaint: Weakness of the right upper and lower extremity.  HPI: Daniel [name] is a 62 y.o. male with history of hypertension, COPD, ongoing tobacco abuse and alcohol abuse, chronic kidney disease stage II presents to the ER with complaints of having transient weakness of the right upper and lower extremity.  Happened thrice today.  First episode was around 4 PM yesterday.  Lasted for around 15 minutes when patient found it difficult to raise it against gravity.  Caught spontaneously better.  Did not have any difficulty moving the extremities in the left or any difficulty speaking or swallowing or any visual symptoms.  Patient symptoms again recurred for few seconds twice after that.  ED Course: In the ER patient appears nonfocal.  CT head was unremarkable.  Neurologist on-call was consulted and patient is being admitted for possible TIA.  Denies any neck pain.  MRI brain was not showing anything acute.  EKG shows normal sinus rhythm with nonspecific ST-T changes.  Review of Systems: As per HPI, rest all negative.   Past Medical History:  Diagnosis Date  . BPH (benign prostatic hyperplasia)   . COPD (chronic obstructive pulmonary disease) ([place])   . Hyperlipidemia   . Hypertension   . Pulmonary nodule     Past Surgical History:  Procedure Laterality Date  . NO PAST SURGERIES       reports that he has been smoking cigarettes.  He has a 42.00 pack-year smoking history. He has never used smokeless tobacco. He reports that he drinks about 7.0 oz of alcohol per week. His drug history is not on file.  No Known Allergies  Family History  Problem Relation Age of Onset  . Emphysema Father   . Cancer Father   . Lung cancer Mother     Prior to Admission medications   Medication Sig Start Date End Date Taking? Authorizing Provider    ADVAIR DISKUS 250-50 MCG/DOSE AEPB TAKE 1 PUFF BY MOUTH TWICE A DAY 11/29/17  Yes Stacks, Cletus Gash, MD  albuterol (PROVENTIL HFA;VENTOLIN HFA) 108 (90 Base) MCG/ACT inhaler Inhale 2 puffs into the lungs every 6 (six) hours as needed for wheezing or shortness of breath. 03/04/17  Yes Eustaquio Maize, MD  amLODipine (NORVASC) 10 MG tablet TAKE 1 TABLET BY MOUTH EVERY DAY 02/01/18  Yes [name], MD  butalbital-acetaminophen-caffeine (FIORICET/CODEINE) [phone] MG capsule Take 1 capsule by mouth every 4 (four) hours as needed for headache. 11/25/17  Yes Stacks, Cletus Gash, MD  chlorthalidone (HYGROTON) 25 MG tablet TAKE ONE TABLET (25 MG DOSE) BY MOUTH DAILY. 01/12/18  Yes [provider]  cloNIDine (CATAPRES) 0.1 MG tablet Take 1 tablet (0.1 mg total) by mouth 2 (two) times daily. Patient taking differently: Take 0.3 mg by mouth 3 (three) times daily.  12/31/17  Yes [name], MD    Physical Exam: Vitals:   03/11/18 0200 03/11/18 0215 03/11/18 0230 03/11/18 0245  BP: 136/75 (!) 142/86 136/76 131/77  Pulse: (!) 48 (!) 53 (!) 48 (!) 45  Resp: 13 12 14 13   Temp:      TempSrc:      SpO2: 94% 93% 95% 95%  Weight:      Height:          Constitutional: Moderately built and nourished. Vitals:   03/11/18 0200 03/11/18 0215 03/11/18 0230 03/11/18 0245  BP: 136/75 Marland Kitchen)  142/86 136/76 131/77  Pulse: (!) 48 (!) 53 (!) 48 (!) 45  Resp: 13 12 14 13   Temp:      TempSrc:      SpO2: 94% 93% 95% 95%  Weight:      Height:       Eyes: Anicteric no pallor. ENMT: No discharge from the ears eyes nose or mouth. Neck: No mass felt.  No neck rigidity.  No JVD appreciated. Respiratory: No rhonchi or crepitations. Cardiovascular: S1-S2 heard no murmurs appreciated. Abdomen: Soft nontender bowel sounds present. Musculoskeletal: No edema.  No joint effusion. Skin: No rash.  Skin appears warm. Neurologic: Alert awake oriented to time place and person.  Moves all extremities 5 x 5.  No facial  asymmetry.  Tongue is midline.  Pupils are equal and reacting to light. Psychiatric: Appears normal.  Normal affect.   Labs on Admission: I have personally reviewed following labs and imaging studies  CBC: Recent Labs  Lab 03/10/18 1731 03/10/18 1744  WBC 7.5  --   NEUTROABS 5.8  --   HGB 14.2 14.6  HCT 41.2 43.0  MCV 89.8  --   PLT 163  --    Basic Metabolic Panel: Recent Labs  Lab 03/10/18 1731 03/10/18 1744  NA 139 139  K 3.4* 3.6  CL 103 101  CO2 24  --   GLUCOSE 100* 100*  BUN 29* 31*  CREATININE 1.82* 1.70*  CALCIUM 9.4  --    GFR: Estimated Creatinine Clearance: 46.5 mL/min (A) (by C-G formula based on SCr of 1.7 mg/dL (H)). Liver Function Tests: Recent Labs  Lab 03/10/18 1731  AST 28  ALT 20  ALKPHOS 72  BILITOT 1.3*  PROT 7.0  ALBUMIN 4.4   No results for input(s): LIPASE, AMYLASE in the last 168 hours. No results for input(s): AMMONIA in the last 168 hours. Coagulation Profile: Recent Labs  Lab 03/10/18 1731  INR 1.00   Cardiac Enzymes: No results for input(s): CKTOTAL, CKMB, CKMBINDEX, TROPONINI in the last 168 hours. BNP (last 3 results) No results for input(s): PROBNP in the last 8760 hours. HbA1C: No results for input(s): HGBA1C in the last 72 hours. CBG: No results for input(s): GLUCAP in the last 168 hours. Lipid Profile: No results for input(s): CHOL, HDL, LDLCALC, TRIG, CHOLHDL, LDLDIRECT in the last 72 hours. Thyroid Function Tests: No results for input(s): TSH, T4TOTAL, FREET4, T3FREE, THYROIDAB in the last 72 hours. Anemia Panel: No results for input(s): VITAMINB12, FOLATE, FERRITIN, TIBC, IRON, RETICCTPCT in the last 72 hours. Urine analysis:    Component Value Date/Time   BILIRUBINUR neg 07/18/2013 0839   PROTEINUR neg 07/18/2013 0839   UROBILINOGEN negative 07/18/2013 0839   NITRITE neg 07/18/2013 0839   LEUKOCYTESUR Negative 07/18/2013 0839   Sepsis Labs: @LABRCNTIP (procalcitonin:4,lacticidven:4) )No results found  for this or any previous visit (from the past 240 hour(s)).   Radiological Exams on Admission: Ct Head Wo Contrast  Result Date: 03/10/2018 CLINICAL DATA:  Sudden onset stroke-like symptoms with weakness and numbness in the left hand and arm. EXAM: CT HEAD WITHOUT CONTRAST TECHNIQUE: Contiguous axial images were obtained from the base of the skull through the vertex without intravenous contrast. COMPARISON:  12/13/2017 FINDINGS: Brain: Redemonstration of mild-to-moderate chronic appearing small vessel ischemic disease of periventricular white matter. No acute intracranial hemorrhage, large vascular territory infarct, intra-axial mass nor extra-axial fluid collections. Midline fourth ventricle basal cisterns. No encephalomalacia. Vascular: No hyperdense vessel sign. Calcified atherosclerosis at the skull base. Skull: Intact  without suspicious osseous abnormality. Sinuses/Orbits: No acute finding. Other: None. IMPRESSION: Chronic stable mild-to-moderate small vessel ischemic disease. No significant change from prior. No acute intracranial abnormality is identified. Electronically Signed   By: Ashley Royalty M.D.   On: 03/10/2018 19:59   Mr Angiogram Head Wo Contrast  Result Date: 03/11/2018 CLINICAL DATA:  Initial evaluation for acute right arm and leg dysfunction EXAM: MRI HEAD WITHOUT CONTRAST MRA HEAD WITHOUT CONTRAST TECHNIQUE: Multiplanar, multiecho pulse sequences of the brain and surrounding structures were obtained without intravenous contrast. Angiographic images of the head were obtained using MRA technique without contrast. COMPARISON:  Prior CT from 03/10/2018 FINDINGS: MRI HEAD FINDINGS Brain: Generalized age appropriate cerebral atrophy with mild to moderate chronic small vessel ischemic change. No abnormal foci of restricted diffusion to suggest acute or subacute ischemia. Gray-white matter differentiation maintained. No evidence for remote cortical infarction. No acute or chronic intracranial  hemorrhage. No mass lesion, midline shift or mass effect. No hydrocephalus. No extra-axial fluid collection. Major dural sinuses are grossly patent. Incidental note made of a partially empty sella. Midline structures intact and normal. Vascular: Major intracranial vascular flow voids are maintained. Skull and upper cervical spine: Craniocervical junction within normal limits. Mild multilevel degenerate spondylolysis noted within the upper cervical spine without significant stenosis. Bone marrow signal intensity within normal limits. No scalp soft tissue abnormality. Sinuses/Orbits: Globes and orbital soft tissues within normal limits. Mild mucosal thickening within the ethmoidal air cells and maxillary sinuses bilaterally. Paranasal sinuses are otherwise clear. Trace opacity right mastoid air cells, of doubtful significance. Inner ear structures normal. Other: None. MRA HEAD FINDINGS ANTERIOR CIRCULATION: Distal cervical segments of the internal carotid arteries widely patent bilaterally. Petrous segments widely patent. Mild atheromatous irregularity within the cavernous ICAs with relatively mild multifocal narrowing. ICA termini widely patent. A1 segments widely patent, with the right being dominant. Normal anterior communicating artery. Anterior cerebral arteries widely patent to their distal aspects. No M1 stenosis or occlusion. No proximal M2 occlusion. Distal MCA branches well perfused and symmetric. POSTERIOR CIRCULATION: Vertebral arteries widely patent to the vertebrobasilar junction. Right vert is dominant. Posterior inferior cerebral arteries patent proximally. Basilar artery patent to its distal aspect without stenosis. Superior cerebral arteries patent bilaterally. Both of the PCAs primarily supplied via the basilar. Mild atherosclerotic change within the PCAs bilaterally without high-grade stenosis. No aneurysm. IMPRESSION: MRI HEAD IMPRESSION 1. No acute intracranial abnormality. 2. Mild to moderate  chronic small vessel ischemic change. MRA HEAD IMPRESSION 1. Negative intracranial MRA for large vessel occlusion. 2. Mild atherosclerotic change for age without hemodynamically significant or correctable stenosis. Electronically Signed   By: Jeannine Boga M.D.   On: 03/11/2018 02:05   Mr Brain Wo Contrast  Result Date: 03/11/2018 CLINICAL DATA:  Initial evaluation for acute right arm and leg dysfunction EXAM: MRI HEAD WITHOUT CONTRAST MRA HEAD WITHOUT CONTRAST TECHNIQUE: Multiplanar, multiecho pulse sequences of the brain and surrounding structures were obtained without intravenous contrast. Angiographic images of the head were obtained using MRA technique without contrast. COMPARISON:  Prior CT from 03/10/2018 FINDINGS: MRI HEAD FINDINGS Brain: Generalized age appropriate cerebral atrophy with mild to moderate chronic small vessel ischemic change. No abnormal foci of restricted diffusion to suggest acute or subacute ischemia. Gray-white matter differentiation maintained. No evidence for remote cortical infarction. No acute or chronic intracranial hemorrhage. No mass lesion, midline shift or mass effect. No hydrocephalus. No extra-axial fluid collection. Major dural sinuses are grossly patent. Incidental note made of a partially empty sella. Midline  structures intact and normal. Vascular: Major intracranial vascular flow voids are maintained. Skull and upper cervical spine: Craniocervical junction within normal limits. Mild multilevel degenerate spondylolysis noted within the upper cervical spine without significant stenosis. Bone marrow signal intensity within normal limits. No scalp soft tissue abnormality. Sinuses/Orbits: Globes and orbital soft tissues within normal limits. Mild mucosal thickening within the ethmoidal air cells and maxillary sinuses bilaterally. Paranasal sinuses are otherwise clear. Trace opacity right mastoid air cells, of doubtful significance. Inner ear structures normal. Other:  None. MRA HEAD FINDINGS ANTERIOR CIRCULATION: Distal cervical segments of the internal carotid arteries widely patent bilaterally. Petrous segments widely patent. Mild atheromatous irregularity within the cavernous ICAs with relatively mild multifocal narrowing. ICA termini widely patent. A1 segments widely patent, with the right being dominant. Normal anterior communicating artery. Anterior cerebral arteries widely patent to their distal aspects. No M1 stenosis or occlusion. No proximal M2 occlusion. Distal MCA branches well perfused and symmetric. POSTERIOR CIRCULATION: Vertebral arteries widely patent to the vertebrobasilar junction. Right vert is dominant. Posterior inferior cerebral arteries patent proximally. Basilar artery patent to its distal aspect without stenosis. Superior cerebral arteries patent bilaterally. Both of the PCAs primarily supplied via the basilar. Mild atherosclerotic change within the PCAs bilaterally without high-grade stenosis. No aneurysm. IMPRESSION: MRI HEAD IMPRESSION 1. No acute intracranial abnormality. 2. Mild to moderate chronic small vessel ischemic change. MRA HEAD IMPRESSION 1. Negative intracranial MRA for large vessel occlusion. 2. Mild atherosclerotic change for age without hemodynamically significant or correctable stenosis. Electronically Signed   By: Jeannine Boga M.D.   On: 03/11/2018 02:05    EKG: Independently reviewed.  Normal sinus rhythm with nonspecific ST changes.  Assessment/Plan Principal Problem:   TIA (transient ischemic attack) Active Problems:   COPD (chronic obstructive pulmonary disease) ([facility])   Hypertension   Right sided weakness   ARF (acute renal failure) ([facility])   Alcohol abuse    1. TIA -patient placed on neurochecks.  If patient passes swallow will keep patient on cardiac healthy diet.  Aspirin.  Advised to quit smoking.  Check carotid Dopplers.  Neurochecks.  Hemoglobin A1c and lipid panel.  Physical therapy consult.  We will  get an MRI C-spine.  Follow neurology recommendations. 2. Acute on chronic renal failure stage II -cause of renal failure not clear.  Will gently hydrate hold HCTZ for now.  Check urine studies.  Follow metabolic panel. 3. History of alcohol abuse placed on CIWA protocol. 4. COPD not actively wheezing.  Continue home inhalers. 5. History of renal artery stenosis.  Follow metabolic panel. 6. Hypertension -allow for permissive hypertension.  Hold HCTZ due to renal failure.  On amlodipine and clonidine which will be continued.  Chest x-ray pending.  DVT prophylaxis: Lovenox. Code Status: Full code. Family Communication: Discussed with patient. Disposition Plan: Home. Consults called: Neurology. Admission status: Observation.   Rise Patience MD [facility] Pager [phone].  If 7PM-7AM, please contact night-coverage [email or web address] Password [email or web address]  03/11/2018, 3:20 AM

## 2018-03-11 NOTE — ED Notes (Signed)
Patient transported to MRI 

## 2018-03-11 NOTE — Progress Notes (Signed)
*  PRELIMINARY RESULTS* Vascular Ultrasound Carotid Duplex (Doppler) has been completed.   Findings suggest 1-39% internal carotid artery stenosis bilaterally. Vertebral arteries are patent with antegrade flow.  03/11/2018 11:01 AM Maudry Mayhew, BS, RVT, RDCS, RDMS

## 2018-03-11 NOTE — Progress Notes (Signed)
Occupational Therapy Evaluation Patient Details Name: Daniel [name] MRN: [id number] DOB: August 19, 1956 Today's Date: 03/11/2018    History of Present Illness Pt is a 62 y.o. male with history of hypertension, COPD, ongoing tobacco abuse and alcohol abuse, and CKD.  He presented to the ED with right arm and leg weakness. 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 Pt presents with significant deficits as listed below and required Mod A with mobility without use of AD, min A with UB ADL and Mod a with LB dressing. Pt reports symptoms are worse since lunch. Nursing and Dr. Erlinda [name] notified of neurological changes compared to status earlier this am. At this time, recommend CIR for rehab given pt's PLOF and occupational performance level. Will follow acutely to facilitate DC to next venue of care.     Follow Up Recommendations  CIR    Equipment Recommendations  Tub/shower bench    Recommendations for Other Services Rehab consult     Precautions / Restrictions Precautions Precautions: Fall  Change in status - nsg notified     Mobility Bed Mobility Overal bed mobility: Needs Assistance Bed Mobility: Supine to Sit     Supine to sit: Supervision;HOB elevated; heavy use of rail     General bed mobility comments: increased time  Transfers Overall transfer level: min A                    Balance Overall balance assessment: Needs assistance   Sitting balance-Leahy Scale: Fair       Standing balance-Leahy Scale: Poor                             ADL either performed or assessed with clinical judgement   ADL Overall ADL's : Needs assistance/impaired Eating/Feeding: Set up Eating/Feeding Details (indicate cue type and reason): unable to feed self with dominant R UE Grooming: Minimal assistance;Standing Grooming Details (indicate cue type and reason): using R  dominant hand as functional assist Upper Body Bathing: Minimal assistance;Sitting   Lower Body Bathing: Minimal assistance;Sit to/from stand   Upper Body Dressing : Moderate assistance;Sitting   Lower Body Dressing: Moderate assistance;Sit to/from stand   Toilet Transfer: Moderate assistance Toilet Transfer Details (indicate cue type and reason): without use of RW Toileting- Clothing Manipulation and Hygiene: Minimal assistance;Sit to/from stand       Functional mobility during ADLs: Moderate assistance       Vision Baseline Vision/History: Wears glasses Patient Visual Report: No change from baseline Additional Comments: appeasr AFL. will further assess     Perception Perception Comments: [facility]   Praxis Praxis Praxis tested?: Deficits Deficits: Motor Impersistence  Difficulty maintaining control of RU/LE when not attending to function of R U/LE    Pertinent Vitals/Pain Pain Assessment: No/denies pain     Hand Dominance Right   Extremity/Trunk Assessment Upper Extremity Assessment Upper Extremity Assessment: RUE deficits/detail RUE Deficits / Details: isolated movement; apparent sensorimotor deficits; difficulty maintaining grasp on cup/toothbrush; frequently dropping items; pt had to use L non-dominant hand to brush teth due to incoordination RUE Sensation: decreased proprioception RUE Coordination: decreased fine motor;decreased gross motor   Lower Extremity Assessment Lower Extremity Assessment: RLE deficits/detail RLE Deficits / Details: poor coordination; ataxia; motor impersistence   Cervical / Trunk Assessment Cervical / Trunk Assessment: Other exceptions(R bias when distracted during functional tasks)  Communication Communication Communication: No difficulties   Cognition Arousal/Alertness: Awake/alert Behavior During Therapy: WFL for tasks assessed/performed Overall Cognitive Status: Impaired/Different from baseline Area of Impairment:  Awareness;Problem solving                             Problem Solving: Slow processing;  General Comments: will further assess   General Comments       Exercises     Shoulder Instructions      Home Living Family/patient expects to be discharged to:: Private residence Living Arrangements: Spouse/significant other Available Help at Discharge: Family;Available 24 hours/day Type of Home: House Home Access: Stairs to enter [employer] of Steps: 4 Entrance Stairs-Rails: Right;Left Home Layout: Bed/bath upstairs     Bathroom Shower/Tub: Corporate investment banker: Standard Bathroom Accessibility: Yes How Accessible: Accessible via walker(need to confirm) Home Equipment: None(but pt has access to all DME)          Prior Functioning/Environment Level of Independence: Independent        Comments: Pt is the [employer] for [employer]. Very active. Responsible for animals on property        OT Problem List: Decreased activity tolerance;Impaired balance (sitting and/or standing);Decreased coordination;Decreased knowledge of use of DME or AE;Impaired sensation;Impaired tone;Impaired UE functional use      OT Treatment/Interventions: Self-care/ADL training;Therapeutic exercise;Neuromuscular education;DME and/or AE instruction;Therapeutic activities;Patient/family education;Balance training    OT Goals(Current goals can be found in the care plan section) Acute Rehab OT Goals Patient Stated Goal: to return to work OT Goal Formulation: With patient Time For Goal Achievement: 03/25/18 Potential to Achieve Goals: Good  OT Frequency: Min 2X/week   Barriers to D/C:            Co-evaluation              AM-PAC PT "6 Clicks" Daily Activity     Outcome Measure Help from another person eating meals?: A Little Help from another person taking care of personal grooming?: A Little Help from another person toileting, which includes  using toliet, bedpan, or urinal?: A Little Help from another person bathing (including washing, rinsing, drying)?: A Little Help from another person to put on and taking off regular upper body clothing?: A Little Help from another person to put on and taking off regular lower body clothing?: A Lot 6 Click Score: 17   End of Session Equipment Utilized During Treatment: Gait belt Nurse Communication: Other (comment)(decline in status)  Activity Tolerance: Treatment limited secondary to medical complications (Comment) Patient left: in chair;with call bell/phone within reach  OT Visit Diagnosis: Other abnormalities of gait and mobility (R26.89);Muscle weakness (generalized) (M62.81);Ataxia, unspecified (R27.0);Hemiplegia and hemiparesis Hemiplegia - Right/Left: Right Hemiplegia - dominant/non-dominant: Dominant Hemiplegia - caused by: Unspecified                Time: 8413-2440 OT Time Calculation (min): 30 min Charges:  OT General Charges $OT Visit: 1 Visit OT Evaluation $OT Eval Moderate Complexity: 1 Mod OT Treatments $Self Care/Home Management : 8-22 mins G-Codes:     [facility], OT/L  [phone] 03/11/2018  Nolton Denis,HILLARY 03/11/2018, 2:55 PM

## 2018-03-11 NOTE — Consult Note (Signed)
Physical Medicine and Rehabilitation Consult Reason for Consult: Decreased functional mobility Referring Physician: [facility]   HPI: Daniel [name] is a 62 y.o. right-handed male history of COPD with tobacco abuse, hyperlipidemia, hypertension, CKD stage III and alcohol abuse.  Per chart review patient lives with spouse.  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  Two-level home with bed and bath upstairs.  Family assistance as needed.  Presented 03/11/2018 with right-sided weakness and ataxia.  MRI with no acute intracranial abnormality.  MRA negative for large vessel occlusion.  Urine drug screen negative.  Troponin negative potassium 3.0.  Echocardiogram with ejection fraction of 65% no wall motion abnormalities.  Carotid Dopplers with no ICA stenosis.  Neurology consulted presently placed on aspirin and Plavix for CVA prophylaxis and subcutaneous Lovenox for DVT prophylaxis will work-up ongoing per neurology.  Physical therapy evaluation completed 03/11/2018 with recommendations of physical medicine rehab consult.   Review of Systems  Constitutional: Negative for fever.  HENT: Negative for hearing loss.   Eyes: Negative for blurred vision and double vision.  Respiratory: Negative for cough and shortness of breath.   Cardiovascular: Negative for chest pain, palpitations and leg swelling.  Gastrointestinal: Positive for constipation. Negative for nausea and vomiting.  Genitourinary: Positive for urgency.  Musculoskeletal: Positive for myalgias.  Skin: Negative for rash.  Neurological: Positive for sensory change and focal weakness.  All other systems reviewed and are negative.  Past Medical History:  Diagnosis Date  . BPH (benign prostatic hyperplasia)   . COPD (chronic obstructive pulmonary disease) ([place])   . Hyperlipidemia   . Hypertension   . Pulmonary nodule    Past Surgical History:  Procedure Laterality Date  . NO PAST SURGERIES      Family History  Problem Relation Age of Onset  . Emphysema Father   . Cancer Father   . Lung cancer Mother    Social History:  reports that he has been smoking cigarettes.  He has a 42.00 pack-year smoking history. He has never used smokeless tobacco. He reports that he drinks about 7.0 oz of alcohol per week. His drug history is not on file. Allergies: No Known Allergies Medications Prior to Admission  Medication Sig Dispense Refill  . ADVAIR DISKUS 250-50 MCG/DOSE AEPB TAKE 1 PUFF BY MOUTH TWICE A DAY 60 each 5  . albuterol (PROVENTIL HFA;VENTOLIN HFA) 108 (90 Base) MCG/ACT inhaler Inhale 2 puffs into the lungs every 6 (six) hours as needed for wheezing or shortness of breath. 1 Inhaler 2  . amLODipine (NORVASC) 10 MG tablet TAKE 1 TABLET BY MOUTH EVERY DAY 90 tablet 0  . butalbital-acetaminophen-caffeine (FIORICET/CODEINE) 50-325-40-30 MG capsule Take 1 capsule by mouth every 4 (four) hours as needed for headache. 30 capsule 0  . chlorthalidone (HYGROTON) 25 MG tablet TAKE ONE TABLET (25 MG DOSE) BY MOUTH DAILY.  6  . cloNIDine (CATAPRES) 0.1 MG tablet Take 1 tablet (0.1 mg total) by mouth 2 (two) times daily. (Patient taking differently: Take 0.3 mg by mouth 3 (three) times daily. 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 Very active. Responsible for animals on property Functional Status:   Mobility: Bed Mobility Overal bed mobility: Needs Assistance Bed Mobility: Supine to Sit Supine to sit: Supervision, HOB elevated General bed mobility comments: increased time Transfers Overall transfer level: Independent Ambulation/Gait Ambulation/Gait assistance: Independent Ambulation Distance (Feet): 250 Feet Assistive device: None Gait Pattern/deviations: WFL(Within Functional Limits) Gait velocity interpretation: >2.62 ft/sec, indicative of community ambulatory Stairs: Yes Stairs assistance: Supervision Stair Management: No rails Number of Stairs: 5 General stair comments: mildly unsteady on descent. Pt demo step to pattern to accomodate. Pt reports this is common for him due to medication. No overt LOB or assist needed.     ADL: ADL Overall ADL's : Needs assistance/impaired Eating/Feeding: Set up Eating/Feeding Details (indicate cue type and reason): unable to feed self with dominant R UE Grooming: Minimal assistance, Standing Grooming Details (indicate cue type and reason): using R dominant hand as functional assist Upper Body Bathing: Minimal assistance, Sitting Lower Body Bathing: Minimal assistance, Sit to/from stand Upper Body Dressing : Moderate assistance, Sitting Lower Body Dressing: Moderate assistance, Sit to/from stand Toilet Transfer: Moderate assistance Toilet Transfer Details (indicate cue type and reason): without use of RW Toileting- Clothing Manipulation and Hygiene: Minimal assistance, Sit to/from stand Functional mobility during ADLs: Moderate assistance  Cognition: Cognition Overall Cognitive Status: Impaired/Different from baseline Orientation Level: Oriented X4 Cognition Arousal/Alertness: Awake/alert Behavior During Therapy: WFL for tasks assessed/performed Overall Cognitive Status: Impaired/Different from baseline Area of Impairment: Awareness, Problem solving Problem Solving: Slow processing General Comments: will further  assess  Blood pressure 129/83, pulse (!) 52, temperature 98.1 F (36.7 C), temperature source Oral, resp. rate 13, height 5\' 10"  (1.778 m), weight 74.8 kg (165 lb), SpO2 97 %. Physical Exam  Neurological:  Patient is alert.  Follow commands with fair awareness of deficits.    Results for orders placed or performed during the hospital encounter of 03/10/18 (from the past 24 hour(s))  Protime-INR     Status: None   Collection Time: 03/10/18  5:31 PM  Result Value Ref Range   Prothrombin Time 13.1 11.4 - 15.2 seconds   INR 1.00   APTT     Status: None   Collection Time: 03/10/18  5:31 PM  Result Value Ref Range   aPTT 28 24 - 36 seconds  CBC     Status: None   Collection Time: 03/10/18  5:31 PM  Result Value Ref Range   WBC 7.5 4.0 - 10.5 K/uL   RBC 4.59 4.22 - 5.81 MIL/uL   Hemoglobin 14.2 13.0 - 17.0 g/dL   HCT 41.2 39.0 - 52.0 %   MCV 89.8 78.0 - 100.0 fL   MCH 30.9 26.0 - 34.0 pg   MCHC 34.5 30.0 - 36.0 g/dL   RDW 13.5 11.5 - 15.5 %   Platelets 163 150 - 400 K/uL  Differential     Status: None   Collection Time: 03/10/18  5:31 PM  Result Value Ref Range   Neutrophils Relative % 76 %   Neutro Abs 5.8 1.7 - 7.7 K/uL   Lymphocytes Relative 17 %   Lymphs Abs 1.3 0.7 - 4.0 K/uL   Monocytes Relative 6 %   Monocytes Absolute 0.4 0.1 - 1.0 K/uL   Eosinophils Relative 1 %   Eosinophils Absolute 0.1 0.0 - 0.7 K/uL   Basophils Relative 0 %   Basophils Absolute 0.0 0.0 - 0.1 K/uL  Comprehensive metabolic panel     Status: Abnormal   Collection Time: 03/10/18  5:31 PM  Result Value Ref Range   Sodium 139 135 - 145 mmol/L   Potassium 3.4 (L) 3.5 - 5.1 mmol/L   Chloride 103 101 - 111 mmol/L   CO2 24 22 - 32 mmol/L   Glucose, Bld 100 (H) 65 - 99 mg/dL   BUN 29 (H) 6 - 20 mg/dL   Creatinine, Ser 1.82 (H) 0.61 - 1.24 mg/dL   Calcium 9.4 8.9 - 10.3 mg/dL   Total Protein 7.0 6.5 - 8.1 g/dL   Albumin 4.4 3.5 - 5.0 g/dL   AST 28 15 - 41 U/L   ALT 20 17 - 63 U/L   Alkaline  Phosphatase 72 38 - 126 U/L   Total Bilirubin 1.3 (H) 0.3 - 1.2 mg/dL   GFR calc non Af Amer 38 (L) >60 mL/min   GFR calc Af Amer 44 (L) >60 mL/min   Anion gap 12 5 - 15  I-stat troponin, ED     Status: None   Collection Time: 03/10/18  5:43 PM  Result Value Ref Range   Troponin i, poc 0.00 0.00 - 0.08 ng/mL   Comment 3          I-Stat Chem 8, ED     Status: Abnormal   Collection Time: 03/10/18  5:44 PM  Result Value Ref Range   Sodium 139 135 - 145 mmol/L   Potassium 3.6 3.5 - 5.1 mmol/L   Chloride 101 101 - 111 mmol/L   BUN 31 (H) 6 - 20 mg/dL   Creatinine, Ser 1.70 (H) 0.61 - 1.24 mg/dL   Glucose, Bld 100 (H) 65 - 99 mg/dL   Calcium, Ion 1.11 (L) 1.15 - 1.40 mmol/L   TCO2 25 22 - 32 mmol/L   Hemoglobin 14.6 13.0 - 17.0 g/dL   HCT 43.0 39.0 - 52.0 %  Urine rapid drug screen (hosp performed)     Status: None   Collection Time: 03/11/18  4:19 AM  Result Value Ref Range   Opiates NONE DETECTED NONE DETECTED   Cocaine NONE DETECTED NONE DETECTED   Benzodiazepines NONE DETECTED NONE DETECTED   Amphetamines NONE DETECTED NONE DETECTED   Tetrahydrocannabinol NONE DETECTED NONE DETECTED   Barbiturates NONE DETECTED NONE DETECTED  Urinalysis, Routine w reflex microscopic     Status: None   Collection Time: 03/11/18  4:19 AM  Result Value Ref Range   Color, Urine YELLOW YELLOW   APPearance CLEAR CLEAR   Specific Gravity, Urine 1.018 1.005 - 1.030   pH 5.0 5.0 - 8.0   Glucose, UA NEGATIVE NEGATIVE mg/dL   Hgb urine dipstick NEGATIVE NEGATIVE   Bilirubin Urine NEGATIVE NEGATIVE   Ketones, ur NEGATIVE NEGATIVE mg/dL   Protein, ur NEGATIVE NEGATIVE mg/dL   Nitrite NEGATIVE NEGATIVE   Leukocytes, UA NEGATIVE NEGATIVE  Sodium, urine, random     Status: None   Collection Time: 03/11/18  4:19 AM  Result Value Ref Range   Sodium, Ur 96 mmol/L  HIV antibody (Routine Testing)     Status: None   Collection Time: 03/11/18  4:30 AM  Result Value Ref Range   HIV Screen 4th Generation  wRfx Non Reactive Non Reactive  Hemoglobin A1c     Status: None   Collection Time: 03/11/18  4:30 AM  Result Value Ref Range   Hgb A1c MFr Bld 5.3 4.8 - 5.6 %   Mean Plasma Glucose 105.41 mg/dL  Lipid panel     Status: Abnormal   Collection Time: 03/11/18  4:30 AM  Result Value Ref Range   Cholesterol 196 0 - 200 mg/dL   Triglycerides 55 <150 mg/dL   HDL 51 >40 mg/dL   Total CHOL/HDL Ratio 3.8 RATIO   VLDL 11 0 - 40 mg/dL   LDL Cholesterol 134 (H) 0 - 99 mg/dL  CBC     Status: None   Collection Time: 03/11/18  4:30 AM  Result Value Ref Range   WBC 5.2 4.0 - 10.5 K/uL   RBC 4.61 4.22 - 5.81 MIL/uL   Hemoglobin 14.3 13.0 - 17.0 g/dL   HCT 41.5 39.0 - 52.0 %   MCV 90.0 78.0 - 100.0 fL   MCH 31.0 26.0 - 34.0 pg   MCHC 34.5 30.0 - 36.0 g/dL   RDW 13.6 11.5 - 15.5 %   Platelets 159 150 - 400 K/uL  Comprehensive metabolic panel     Status: Abnormal   Collection Time: 03/11/18  4:30 AM  Result Value Ref Range   Sodium 137 135 - 145 mmol/L   Potassium 3.0 (L) 3.5 - 5.1 mmol/L   Chloride 99 (L) 101 - 111 mmol/L   CO2 28 22 - 32 mmol/L   Glucose, Bld 115 (H) 65 - 99 mg/dL   BUN 25 (H) 6 - 20 mg/dL   Creatinine, Ser 1.56 (H) 0.61 - 1.24 mg/dL   Calcium 9.3 8.9 - 10.3 mg/dL   Total Protein 6.7 6.5 - 8.1 g/dL   Albumin 4.1 3.5 - 5.0 g/dL   AST 23 15 - 41 U/L   ALT 18 17 - 63 U/L   Alkaline Phosphatase 65 38 - 126 U/L   Total Bilirubin 1.7 (H) 0.3 - 1.2 mg/dL   GFR calc non Af Amer 46 (L) >60 mL/min   GFR calc Af Amer 53 (L) >60 mL/min   Anion gap 10 5 - 15  Troponin I     Status: None   Collection Time: 03/11/18  6:14 AM  Result Value Ref Range   Troponin I <0.03 <0.03 ng/mL   Ct Head Wo Contrast  Result Date: 03/10/2018 CLINICAL DATA:  Sudden onset stroke-like symptoms with weakness and numbness in the left hand and arm. EXAM: CT HEAD WITHOUT CONTRAST TECHNIQUE: Contiguous axial images were obtained from the base of the skull through the vertex without intravenous contrast.  COMPARISON:  12/13/2017 FINDINGS: Brain: Redemonstration of mild-to-moderate chronic appearing small vessel ischemic disease of periventricular white matter. No acute intracranial hemorrhage, large vascular territory infarct, intra-axial mass nor extra-axial fluid collections. Midline fourth ventricle basal cisterns. No encephalomalacia. Vascular: No hyperdense vessel sign. Calcified atherosclerosis at the skull base. Skull: Intact without suspicious osseous abnormality. Sinuses/Orbits: No acute finding. Other: None. IMPRESSION: Chronic stable mild-to-moderate small vessel ischemic disease. No significant change from prior. No acute intracranial abnormality is identified. Electronically Signed   By: Ashley Royalty M.D.   On: 03/10/2018 19:59   Mr Angiogram Head Wo Contrast  Result Date: 03/11/2018 CLINICAL DATA:  Initial evaluation for acute right arm and leg dysfunction EXAM: MRI HEAD WITHOUT CONTRAST MRA HEAD WITHOUT CONTRAST TECHNIQUE: Multiplanar, multiecho pulse sequences of the brain and surrounding structures were obtained without intravenous contrast. Angiographic images of the head were obtained using MRA technique without contrast. COMPARISON:  Prior CT from 03/10/2018 FINDINGS: MRI HEAD FINDINGS Brain: Generalized age appropriate cerebral atrophy with mild to moderate chronic small vessel ischemic change. No abnormal foci of restricted diffusion  to suggest acute or subacute ischemia. Gray-white matter differentiation maintained. No evidence for remote cortical infarction. No acute or chronic intracranial hemorrhage. No mass lesion, midline shift or mass effect. No hydrocephalus. No extra-axial fluid collection. Major dural sinuses are grossly patent. Incidental note made of a partially empty sella. Midline structures intact and normal. Vascular: Major intracranial vascular flow voids are maintained. Skull and upper cervical spine: Craniocervical junction within normal limits. Mild multilevel degenerate  spondylolysis noted within the upper cervical spine without significant stenosis. Bone marrow signal intensity within normal limits. No scalp soft tissue abnormality. Sinuses/Orbits: Globes and orbital soft tissues within normal limits. Mild mucosal thickening within the ethmoidal air cells and maxillary sinuses bilaterally. Paranasal sinuses are otherwise clear. Trace opacity right mastoid air cells, of doubtful significance. Inner ear structures normal. Other: None. MRA HEAD FINDINGS ANTERIOR CIRCULATION: Distal cervical segments of the internal carotid arteries widely patent bilaterally. Petrous segments widely patent. Mild atheromatous irregularity within the cavernous ICAs with relatively mild multifocal narrowing. ICA termini widely patent. A1 segments widely patent, with the right being dominant. Normal anterior communicating artery. Anterior cerebral arteries widely patent to their distal aspects. No M1 stenosis or occlusion. No proximal M2 occlusion. Distal MCA branches well perfused and symmetric. POSTERIOR CIRCULATION: Vertebral arteries widely patent to the vertebrobasilar junction. Right vert is dominant. Posterior inferior cerebral arteries patent proximally. Basilar artery patent to its distal aspect without stenosis. Superior cerebral arteries patent bilaterally. Both of the PCAs primarily supplied via the basilar. Mild atherosclerotic change within the PCAs bilaterally without high-grade stenosis. No aneurysm. IMPRESSION: MRI HEAD IMPRESSION 1. No acute intracranial abnormality. 2. Mild to moderate chronic small vessel ischemic change. MRA HEAD IMPRESSION 1. Negative intracranial MRA for large vessel occlusion. 2. Mild atherosclerotic change for age without hemodynamically significant or correctable stenosis. Electronically Signed   By: Jeannine Boga M.D.   On: 03/11/2018 02:05   Mr Brain Wo Contrast  Result Date: 03/11/2018 CLINICAL DATA:  Initial evaluation for acute right arm and leg  dysfunction EXAM: MRI HEAD WITHOUT CONTRAST MRA HEAD WITHOUT CONTRAST TECHNIQUE: Multiplanar, multiecho pulse sequences of the brain and surrounding structures were obtained without intravenous contrast. Angiographic images of the head were obtained using MRA technique without contrast. COMPARISON:  Prior CT from 03/10/2018 FINDINGS: MRI HEAD FINDINGS Brain: Generalized age appropriate cerebral atrophy with mild to moderate chronic small vessel ischemic change. No abnormal foci of restricted diffusion to suggest acute or subacute ischemia. Gray-white matter differentiation maintained. No evidence for remote cortical infarction. No acute or chronic intracranial hemorrhage. No mass lesion, midline shift or mass effect. No hydrocephalus. No extra-axial fluid collection. Major dural sinuses are grossly patent. Incidental note made of a partially empty sella. Midline structures intact and normal. Vascular: Major intracranial vascular flow voids are maintained. Skull and upper cervical spine: Craniocervical junction within normal limits. Mild multilevel degenerate spondylolysis noted within the upper cervical spine without significant stenosis. Bone marrow signal intensity within normal limits. No scalp soft tissue abnormality. Sinuses/Orbits: Globes and orbital soft tissues within normal limits. Mild mucosal thickening within the ethmoidal air cells and maxillary sinuses bilaterally. Paranasal sinuses are otherwise clear. Trace opacity right mastoid air cells, of doubtful significance. Inner ear structures normal. Other: None. MRA HEAD FINDINGS ANTERIOR CIRCULATION: Distal cervical segments of the internal carotid arteries widely patent bilaterally. Petrous segments widely patent. Mild atheromatous irregularity within the cavernous ICAs with relatively mild multifocal narrowing. ICA termini widely patent. A1 segments widely patent, with the right being dominant. Normal  anterior communicating artery. Anterior cerebral  arteries widely patent to their distal aspects. No M1 stenosis or occlusion. No proximal M2 occlusion. Distal MCA branches well perfused and symmetric. POSTERIOR CIRCULATION: Vertebral arteries widely patent to the vertebrobasilar junction. Right vert is dominant. Posterior inferior cerebral arteries patent proximally. Basilar artery patent to its distal aspect without stenosis. Superior cerebral arteries patent bilaterally. Both of the PCAs primarily supplied via the basilar. Mild atherosclerotic change within the PCAs bilaterally without high-grade stenosis. No aneurysm. IMPRESSION: MRI HEAD IMPRESSION 1. No acute intracranial abnormality. 2. Mild to moderate chronic small vessel ischemic change. MRA HEAD IMPRESSION 1. Negative intracranial MRA for large vessel occlusion. 2. Mild atherosclerotic change for age without hemodynamically significant or correctable stenosis. Electronically Signed   By: Jeannine Boga M.D.   On: 03/11/2018 02:05   Dg Chest Port 1 View  Result Date: 03/11/2018 CLINICAL DATA:  62 year old male with COPD. EXAM: PORTABLE CHEST 1 VIEW COMPARISON:  Chest CT dated 05/11/2014 FINDINGS: There is mild diffuse interstitial coarsening. No focal consolidation, pleural effusion, or pneumothorax. The cardiac silhouette is within normal limits. No acute osseous pathology. IMPRESSION: No active disease. Electronically Signed   By: Anner Crete M.D.   On: 03/11/2018 06:33    Pt walked 250' independently with PT today. Will follow for neurological course, but I would anticipate only needing [place] services.   Thanks,  Meredith Staggers, MD, [facility] 03/11/2018      Lavon Paganini [place], PA-C 03/11/2018

## 2018-03-11 NOTE — Progress Notes (Signed)
  Echocardiogram 2D Echocardiogram has been performed.  Daniel [name] L Androw 03/11/2018, 10:23 AM

## 2018-03-11 NOTE — Progress Notes (Addendum)
STROKE TEAM PROGRESS NOTE   SUBJECTIVE (INTERVAL HISTORY) RN at bedside. Pt was sitting in chair. OT worked with him earlier and just put him back to chair. When OT work with him, he was found to have right leg dragging and right hand ataxia. Pt also mentioned that he ate lunch with no coordination of right hand. Concerning for capsular warning syndrome. He already had ASA 325 in am, and plavix loaded 300mg  1pm. Will give lipitor 80 stat. Keep bed rest for 24 hours. BP so far 130s. He stated that he smokes 2PPD for a long time and now down to 1PPD for the last 3 months. Educated on quit smoking.  OBJECTIVE Temp:  [97.3 F (36.3 C)-98.4 F (36.9 C)] 98.1 F (36.7 C) (05/09 2133) Pulse Rate:  [44-64] 52 (05/10 0515) Cardiac Rhythm: Sinus bradycardia (05/10 0700) Resp:  [12-25] 13 (05/10 0515) BP: (120-152)/(73-91) 129/83 (05/10 0515) SpO2:  [93 %-100 %] 97 % (05/10 0900) Weight:  [165 lb (74.8 kg)] 165 lb (74.8 kg) (05/09 1715)  CBC:  Recent Labs  Lab 03/10/18 1731 03/10/18 1744 03/11/18 0430  WBC 7.5  --  5.2  NEUTROABS 5.8  --   --   HGB 14.2 14.6 14.3  HCT 41.2 43.0 41.5  MCV 89.8  --  90.0  PLT 163  --  867    Basic Metabolic Panel:  Recent Labs  Lab 03/10/18 1731 03/10/18 1744 03/11/18 0430  NA 139 139 137  K 3.4* 3.6 3.0*  CL 103 101 99*  CO2 24  --  28  GLUCOSE 100* 100* 115*  BUN 29* 31* 25*  CREATININE 1.82* 1.70* 1.56*  CALCIUM 9.4  --  9.3    Lipid Panel:     Component Value Date/Time   CHOL 196 03/11/2018 0430   TRIG 55 03/11/2018 0430   TRIG 74 07/18/2013 0818   HDL 51 03/11/2018 0430   HDL 65 07/18/2013 0818   CHOLHDL 3.8 03/11/2018 0430   VLDL 11 03/11/2018 0430   LDLCALC 134 (H) 03/11/2018 0430   LDLCALC 133 (H) 07/18/2013 0818   HgbA1c:  Lab Results  Component Value Date   HGBA1C 5.3 03/11/2018   Urine Drug Screen:     Component Value Date/Time   LABOPIA NONE DETECTED 03/11/2018 0419   COCAINSCRNUR NONE DETECTED 03/11/2018 0419    LABBENZ NONE DETECTED 03/11/2018 0419   AMPHETMU NONE DETECTED 03/11/2018 0419   THCU NONE DETECTED 03/11/2018 0419   LABBARB NONE DETECTED 03/11/2018 0419    Alcohol Level No results found for: [place] I have personally reviewed the radiological images below and agree with the radiology interpretations.  Ct Head Wo Contrast 03/10/2018 IMPRESSION:  Chronic stable mild-to-moderate small vessel ischemic disease. No significant change from prior. No acute intracranial abnormality is identified.   MRI HEAD IMPRESSION  1. No acute intracranial abnormality.  2. Mild to moderate chronic small vessel ischemic change.   MRA HEAD IMPRESSION  1. Negative intracranial MRA for large vessel occlusion.  2. Mild atherosclerotic change for age without hemodynamically significant or correctable stenosis.   Transthoracic Echocardiogram  - LVEF 60-65%, normal wall thickness, normal wall motion, normal   diastolic function, trivial MR, moderate biatrial enlargement,   trivial TR, RVSP 40 mmHg, dilated IVC.  Bilateral Carotid Dopplers   Findings suggest 1-39% internal carotid artery stenosis bilaterally. Vertebral arteries are patent with antegrade flow.   PHYSICAL EXAM Vitals:   03/11/18 0245 03/11/18 0314 03/11/18 0515 03/11/18 0900  BP:  131/77 (!) 152/80 129/83   Pulse: (!) 45 (!) 58 (!) 52   Resp: 13 13 13    Temp:      TempSrc:      SpO2: 95% 95% 94% 97%  Weight:      Height:        General - pleasant 62 year old male in bed in no acute distress Heart - Regular rate and rhythm - no murmer appreciated Lungs - Clear to auscultation but with decreased breath sounds throughout. Abdomen - Soft - non tender Extremities - Distal pulses weak to absent - no edema Skin - Warm and dry  Mental Status: Alert, oriented, thought content appropriate.  Speech fluent without evidence of aphasia.  Able to follow 3 step commands without difficulty. Cranial Nerves: II: Discs not visualized;  Visual fields grossly normal, pupils equal, round, reactive to light. III,IV, VI: ptosis not present, extra-ocular motions intact bilaterally V,VII: mild right facial droop VIII: hearing normal bilaterally IX,X: gag reflex present XI: bilateral shoulder shrug intact. XII: midline tongue extension Motor: RUE - 5/5 but decreased dexterity     LUE - 5/5 RLE - 5/5 but distal DF 4+/5   LLE -  5/5 Tone and bulk:normal tone throughout; no atrophy noted Sensory: Light touch intact throughout, bilaterally Cerebellar: Dysmetria at FTN on the right arm, ataxia at HTS on the right leg Gait: not tested, but according to OT and RN, he has right leg dragging   ASSESSMENT/PLAN Mr. Tung Pustejovsky is a 62 y.o. male with history of pulmonary nodule, ongoing tobacco use, ETOH use, hypertension, hyperlipidemia, COPD, CKD with likely renal artery stenosis presenting with intermittent right-sided weakness. He did not receive IV t-PA due to resolution of deficits and late presentation. However, at lunch time, his right sided weakness returned.  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   MRI head - No acute intracranial abnormality  MRA head - negative  Repeat MRI pending  MRI C-spine pending - ordered by primary team  Carotid Doppler unremarkable  2D Echo EF 60-65%  LDL - 134  HgbA1c - 5.3  VTE prophylaxis - Lovenox  No antithrombotic prior to admission, given concerns of capsular warning syndrome, loaded with ASA and plavix and also lipitor 80. Continue DAPT with ASA 81 and plavix 75mg  for 3 weeks and then plavix alone.  Keep bed rest for another 24 hours.  Patient counseled to be compliant with his antithrombotic medications  Ongoing aggressive stroke risk factor management  Therapy recommendations:  CIR  Disposition:  Pending  Hypertension  Stable . Permissive  hypertension (OK if < 220/120) but gradually normalize in 5-7 days . D/c clonidine for now . Bed rest for another 24 hours . Long-term BP goal normotensive  Hyperlipidemia  Lipid lowering medication PTA:  none  LDL 134, goal < 70  Current lipid lowering medication: now on Lipitor 80 mg daily  Continue statin at discharge  Tobacco abuse  Current heavy smoker  Smoking cessation counseling provided  Pt is willing to quit  Other Stroke Risk Factors  Advanced age  ETOH use, advised to drink no more than 1 alcoholic beverage per day.  Other Active Problems  Hypokalemia - supplemented  CKD - Cre 1.82->1.70->1.56 - continue hydration currently NS @ 100  Renal artery stenosis ( per patient history)  Hospital day # 0  I spent  35 minutes in total face-to-face time  with the patient, more than 50% of which was spent in counseling and coordination of care, reviewing test results, images and medication, and discussing the diagnosis of capsular waring syndrome and recurrent right side hemiparesis, treatment plan and potential prognosis. This patient's care requiresreview of multiple databases, neurological assessment, discussion with family, other specialists and medical decision making of high complexity. I had long discussion with pt at bedside, updated pt current condition, treatment plan and potential prognosis. He expressed understanding and appreciation. I also discussed with RN and OT.   Rosalin Hawking, MD PhD Stroke Neurology 03/11/2018 2:29 PM   To contact Stroke Continuity provider, please refer to [email or web address] After hours, contact General Neurology

## 2018-03-11 NOTE — Consult Note (Signed)
Requesting Physician: Dr. Hal Hope    Chief Complaint: R side weakness  History obtained from: Patient and Chart     HPI:                                                                                                                                       Daniel [name] is an 62 y.o. male 62 y.o. male with history of hypertension, COPD, ongoing tobacco abuse and alcohol abuse, CKD Presents with right arm and leg weakness to [facility] emergency room. The patient's symptoms started at right arm and right leg weakness. He felt as if his right arm went dead, lasting for about 20 minutes. He was brought by his wife to the ER in their car, and while he was getting out of the car his symptoms return and he almost  [place] to the ground. However they gradually improved and he has felt his symptoms come and go for about 5 times. Currently is back to his baseline. He is not on aspirin at home. His poorly controlled blood pressure. Still smokes, had quit in the past but has now resumed.  Date last known well: 5.9.19 Time last known well: 4 PM tPA Given: no symptoms of results and outside window NIHSS: 0 Baseline MRS 0     Past Medical History:  Diagnosis Date  . BPH (benign prostatic hyperplasia)   . COPD (chronic obstructive pulmonary disease) ([place])   . Hyperlipidemia   . Hypertension   . Pulmonary nodule     Past Surgical History:  Procedure Laterality Date  . NO PAST SURGERIES      Family History  Problem Relation Age of Onset  . Emphysema Father   . Cancer Father   . Lung cancer Mother    Social History:  reports that he has been smoking cigarettes.  He has a 42.00 pack-year smoking history. He has never used smokeless tobacco. He reports that he drinks about 7.0 oz of alcohol per week. His drug history is not on file.  Allergies: No Known Allergies  Medications:                                                                                                                         I reviewed home medications   ROS:  14 systems reviewed and negative except above   Examination:                                                                                                      General: Appears well-developed and well-nourished.  Psych: Affect appropriate to situation Eyes: No scleral injection HENT: No OP obstrucion Head: Normocephalic.  Cardiovascular: Normal rate and regular rhythm.  Respiratory: Effort normal and breath sounds normal to anterior ascultation GI: Soft.  No distension. There is no tenderness.  Skin: WDI   Neurological Examination Mental Status: Alert, oriented, thought content appropriate.  Speech fluent without evidence of aphasia. Able to follow 3 step commands without difficulty. Cranial Nerves: II: Visual fields grossly normal,  III,IV, VI: ptosis not present, extra-ocular motions intact bilaterally, pupils equal, round, reactive to light and accommodation V,VII: smile symmetric, facial light touch sensation normal bilaterally VIII: hearing normal bilaterally IX,X: uvula rises symmetrically XI: bilateral shoulder shrug XII: midline tongue extension Motor: Right : Upper extremity   5/5    Left:     Upper extremity   5/5  Lower extremity   5/5     Lower extremity   5/5 Tone and bulk:normal tone throughout; no atrophy noted Sensory: Pinprick and light touch intact throughout, bilaterally Deep Tendon Reflexes: 2+ and symmetric throughout Plantars: Right: downgoing   Left: downgoing Cerebellar: normal finger-to-nose, normal rapid alternating movements and normal heel-to-shin test Gait: normal gait and station     Lab Results: Basic Metabolic Panel: Recent Labs  Lab 03/10/18 1731 03/10/18 1744 03/11/18 0430  NA 139 139 137  K 3.4* 3.6 3.0*  CL 103 101 99*  CO2 24  --  28  GLUCOSE 100* 100*  115*  BUN 29* 31* 25*  CREATININE 1.82* 1.70* 1.56*  CALCIUM 9.4  --  9.3    CBC: Recent Labs  Lab 03/10/18 1731 03/10/18 1744 03/11/18 0430  WBC 7.5  --  5.2  NEUTROABS 5.8  --   --   HGB 14.2 14.6 14.3  HCT 41.2 43.0 41.5  MCV 89.8  --  90.0  PLT 163  --  159    Coagulation Studies: Recent Labs    03/10/18 1731  LABPROT 13.1  INR 1.00    Imaging: Ct Head Wo Contrast  Result Date: 03/10/2018 CLINICAL DATA:  Sudden onset stroke-like symptoms with weakness and numbness in the left hand and arm. EXAM: CT HEAD WITHOUT CONTRAST TECHNIQUE: Contiguous axial images were obtained from the base of the skull through the vertex without intravenous contrast. COMPARISON:  12/13/2017 FINDINGS: Brain: Redemonstration of mild-to-moderate chronic appearing small vessel ischemic disease of periventricular white matter. No acute intracranial hemorrhage, large vascular territory infarct, intra-axial mass nor extra-axial fluid collections. Midline fourth ventricle basal cisterns. No encephalomalacia. Vascular: No hyperdense vessel sign. Calcified atherosclerosis at the skull base. Skull: Intact without suspicious osseous abnormality. Sinuses/Orbits: No acute finding. Other: None. IMPRESSION: Chronic stable mild-to-moderate small vessel ischemic disease. No significant change from prior. No acute intracranial abnormality is identified. Electronically Signed   By: Meredith Leeds.D.  On: 03/10/2018 19:59   Mr Angiogram Head Wo Contrast  Result Date: 03/11/2018 CLINICAL DATA:  Initial evaluation for acute right arm and leg dysfunction EXAM: MRI HEAD WITHOUT CONTRAST MRA HEAD WITHOUT CONTRAST TECHNIQUE: Multiplanar, multiecho pulse sequences of the brain and surrounding structures were obtained without intravenous contrast. Angiographic images of the head were obtained using MRA technique without contrast. COMPARISON:  Prior CT from 03/10/2018 FINDINGS: MRI HEAD FINDINGS Brain: Generalized age appropriate  cerebral atrophy with mild to moderate chronic small vessel ischemic change. No abnormal foci of restricted diffusion to suggest acute or subacute ischemia. Gray-white matter differentiation maintained. No evidence for remote cortical infarction. No acute or chronic intracranial hemorrhage. No mass lesion, midline shift or mass effect. No hydrocephalus. No extra-axial fluid collection. Major dural sinuses are grossly patent. Incidental note made of a partially empty sella. Midline structures intact and normal. Vascular: Major intracranial vascular flow voids are maintained. Skull and upper cervical spine: Craniocervical junction within normal limits. Mild multilevel degenerate spondylolysis noted within the upper cervical spine without significant stenosis. Bone marrow signal intensity within normal limits. No scalp soft tissue abnormality. Sinuses/Orbits: Globes and orbital soft tissues within normal limits. Mild mucosal thickening within the ethmoidal air cells and maxillary sinuses bilaterally. Paranasal sinuses are otherwise clear. Trace opacity right mastoid air cells, of doubtful significance. Inner ear structures normal. Other: None. MRA HEAD FINDINGS ANTERIOR CIRCULATION: Distal cervical segments of the internal carotid arteries widely patent bilaterally. Petrous segments widely patent. Mild atheromatous irregularity within the cavernous ICAs with relatively mild multifocal narrowing. ICA termini widely patent. A1 segments widely patent, with the right being dominant. Normal anterior communicating artery. Anterior cerebral arteries widely patent to their distal aspects. No M1 stenosis or occlusion. No proximal M2 occlusion. Distal MCA branches well perfused and symmetric. POSTERIOR CIRCULATION: Vertebral arteries widely patent to the vertebrobasilar junction. Right vert is dominant. Posterior inferior cerebral arteries patent proximally. Basilar artery patent to its distal aspect without stenosis. Superior  cerebral arteries patent bilaterally. Both of the PCAs primarily supplied via the basilar. Mild atherosclerotic change within the PCAs bilaterally without high-grade stenosis. No aneurysm. IMPRESSION: MRI HEAD IMPRESSION 1. No acute intracranial abnormality. 2. Mild to moderate chronic small vessel ischemic change. MRA HEAD IMPRESSION 1. Negative intracranial MRA for large vessel occlusion. 2. Mild atherosclerotic change for age without hemodynamically significant or correctable stenosis. Electronically Signed   By: Jeannine Boga M.D.   On: 03/11/2018 02:05   Mr Brain Wo Contrast  Result Date: 03/11/2018 CLINICAL DATA:  Initial evaluation for acute right arm and leg dysfunction EXAM: MRI HEAD WITHOUT CONTRAST MRA HEAD WITHOUT CONTRAST TECHNIQUE: Multiplanar, multiecho pulse sequences of the brain and surrounding structures were obtained without intravenous contrast. Angiographic images of the head were obtained using MRA technique without contrast. COMPARISON:  Prior CT from 03/10/2018 FINDINGS: MRI HEAD FINDINGS Brain: Generalized age appropriate cerebral atrophy with mild to moderate chronic small vessel ischemic change. No abnormal foci of restricted diffusion to suggest acute or subacute ischemia. Gray-white matter differentiation maintained. No evidence for remote cortical infarction. No acute or chronic intracranial hemorrhage. No mass lesion, midline shift or mass effect. No hydrocephalus. No extra-axial fluid collection. Major dural sinuses are grossly patent. Incidental note made of a partially empty sella. Midline structures intact and normal. Vascular: Major intracranial vascular flow voids are maintained. Skull and upper cervical spine: Craniocervical junction within normal limits. Mild multilevel degenerate spondylolysis noted within the upper cervical spine without significant stenosis. Bone marrow signal intensity within  normal limits. No scalp soft tissue abnormality. Sinuses/Orbits:  Globes and orbital soft tissues within normal limits. Mild mucosal thickening within the ethmoidal air cells and maxillary sinuses bilaterally. Paranasal sinuses are otherwise clear. Trace opacity right mastoid air cells, of doubtful significance. Inner ear structures normal. Other: None. MRA HEAD FINDINGS ANTERIOR CIRCULATION: Distal cervical segments of the internal carotid arteries widely patent bilaterally. Petrous segments widely patent. Mild atheromatous irregularity within the cavernous ICAs with relatively mild multifocal narrowing. ICA termini widely patent. A1 segments widely patent, with the right being dominant. Normal anterior communicating artery. Anterior cerebral arteries widely patent to their distal aspects. No M1 stenosis or occlusion. No proximal M2 occlusion. Distal MCA branches well perfused and symmetric. POSTERIOR CIRCULATION: Vertebral arteries widely patent to the vertebrobasilar junction. Right vert is dominant. Posterior inferior cerebral arteries patent proximally. Basilar artery patent to its distal aspect without stenosis. Superior cerebral arteries patent bilaterally. Both of the PCAs primarily supplied via the basilar. Mild atherosclerotic change within the PCAs bilaterally without high-grade stenosis. No aneurysm. IMPRESSION: MRI HEAD IMPRESSION 1. No acute intracranial abnormality. 2. Mild to moderate chronic small vessel ischemic change. MRA HEAD IMPRESSION 1. Negative intracranial MRA for large vessel occlusion. 2. Mild atherosclerotic change for age without hemodynamically significant or correctable stenosis. Electronically Signed   By: Jeannine Boga M.D.   On: 03/11/2018 02:05     ASSESSMENT AND PLAN  62 y.o. male with history of hypertension, COPD, ongoing tobacco abuse and alcohol abuse, CKD Presents with right arm and leg weakness occurring on and off multiple times is 4 PM. His symptoms has resolved and patient is back to his baseline. MRI brain is negative for  acute stroke. MRA head shows no significant stenosis. Patient has multiple uncontrolled risk factors such as uncontrolled hypertension,continues to smoke.   Transient ischemic attack  Risk factors:hypertension, CKD, smoking Etiology: small vessel, stuttering lacune   Recommend # MRI of the brain without contrast : No acute stroke  #MRA Head and neck : no signficant stenosis  # carotid US #Transthoracic Echo  # Start patient on ASA 325mg  daily #Start or continue Atorvastatin 80 mg/other high intensity statin # BP goal: permissive HTN upto 412 systolic, PRNs above 21 # HBAIC and Lipid profile # Telemetry monitoring # Frequent neuro checks # stroke swallow screen #Smoking cessation   Please page stroke NP  Or  PA  Or MD from 8am -4 pm  as this patient from this time will be  followed by the stroke.   You can look them up on [email or web address]  Password [facility]     Yuktha Kerchner [facility] Pager Number [phone]

## 2018-03-11 NOTE — Progress Notes (Signed)
Patient is a 62 year old male with past medical history of hypertension, COPD, ongoing tobacco abuse, alcohol abuse, CKD stage II ,bilateral renal artery stenosis who  presents to the emergency department with complaints of weakness of the right upper and lower extremity .Patient's symptoms had completely resolved when he presented to the emergency department.  He was admitted for possible TIA.  Neurology consulted. Patient brain imagings did not show any acute stroke or vessel occlusion.  Echocardiogram showed normal wall thickness, normal systolic function with ejection fraction of 60 to 55%,no wall motion abnormality.  Carotid Doppler did not show any significant carotid artery stenosis. Patient again had transient episode of weakness of his right lower extremity and upper extremity this afternoon.  Neurology recommended to monitor him for 24 hours more for possible capsular syndrome.  Repeat MRI of the brain and MRI C-spine was ordered. Found to have acute kidney injury ,so IV fluids were started. Continue to monitor the patient.  Patient has been started on aspirin ,Plavix and statin. HbA1C is 5.3 ,LDL 134. Patient was seen by Dr. Hal Hope this morning.

## 2018-03-11 NOTE — Evaluation (Addendum)
Physical Therapy Evaluation Patient Details Name: Tristain Daily MRN: [id number] DOB: 01/09/1956 Today's Date: 03/11/2018   History of Present Illness  Pt is a 62 y.o. male with history of hypertension, COPD, ongoing tobacco abuse and alcohol abuse, and CKD.  He presented to the ED with right arm and leg weakness.    Clinical Impression  PT eval complete. Pt is independent/modified independent with all functional mobility. He scored 23/24 on DGI. Pt reports mild balance deficits at baseline due to recent change in BP meds. No further PT intervention indicated. PT signing off. Addendum 1412: Spoke with OT. Pt with significant change in status. Unable to ambulate outside of room even with RW. MD suspecting stuttering stroke. Repeat MRI to be performed. PT to further assess this PM. Discharge recommendation changed to CIR in light of new symptoms.     Follow Up Recommendations CIR    Equipment Recommendations       Recommendations for Other Services       Precautions / Restrictions Precautions Precautions: Fall      Mobility  Bed Mobility Overal bed mobility: Independent                Transfers Overall transfer level: Independent                  Ambulation/Gait Ambulation/Gait assistance: Independent Ambulation Distance (Feet): 250 Feet Assistive device: None Gait Pattern/deviations: WFL(Within Functional Limits)   Gait velocity interpretation: >2.62 ft/sec, indicative of community ambulatory    Stairs Stairs: Yes Stairs assistance: Supervision Stair Management: No rails Number of Stairs: 5 General stair comments: mildly unsteady on descent. Pt demo step to pattern to accomodate. Pt reports this is common for him due to medication. No overt LOB or assist needed.   Wheelchair Mobility    Modified Rankin (Stroke Patients Only)       Balance Overall balance assessment: Modified Independent                                Standardized Balance Assessment Standardized Balance Assessment : Dynamic Gait Index   Dynamic Gait Index Level Surface: Normal Change in Gait Speed: Normal Gait with Horizontal Head Turns: Normal Gait with Vertical Head Turns: Normal Gait and Pivot Turn: Normal Step Over Obstacle: Normal Step Around Obstacles: Normal Steps: Mild Impairment Total Score: 23       Pertinent Vitals/Pain Pain Assessment: No/denies pain    Home Living Family/patient expects to be discharged to:: Private residence Living Arrangements: Spouse/significant other Available Help at Discharge: Family;Available 24 hours/day Type of Home: House Home Access: Stairs to enter Entrance Stairs-Rails: Psychiatric nurse of Steps: 4 Home Layout: Bed/bath upstairs Home Equipment: None(but pt has access to all DME)      Prior Function Level of Independence: Independent         Comments: Pt is the [employer] for [employer]. Very active. Responsible for animals on property     Hand Dominance   Dominant Hand: Right    Extremity/Trunk Assessment   Upper Extremity Assessment Upper Extremity Assessment: RUE deficits/detail RUE Deficits / Details: isolated movement; apparent sensorimotor control         Cervical / Trunk Assessment Cervical / Trunk Assessment: Normal  Communication   Communication: No difficulties  Cognition Arousal/Alertness: Awake/alert Behavior During Therapy: WFL for tasks assessed/performed Overall Cognitive Status: Impaired/Different from baseline Area of Impairment: Awareness;Problem solving  Problem Solving: Slow processing General Comments: will further assess      General Comments      Exercises     Assessment/Plan    PT Assessment Patient needs continued PT services  PT Problem List         PT Treatment Interventions Gait training;DME instruction;Therapeutic activities;Therapeutic  exercise;Patient/family education;Balance training;Stair training;Functional mobility training;Neuromuscular re-education    PT Goals (Current goals can be found in the Care Plan section)  Acute Rehab PT Goals Patient Stated Goal: home    Frequency Min 4X/week   Barriers to discharge        Co-evaluation               AM-PAC PT "6 Clicks" Daily Activity  Outcome Measure                  End of Session              Time:  -      Charges:         PT G Codes:        Lorrin Goodell, PT  Office # [phone] Pager [phone]   Lorriane Shire 03/11/2018, 2:16 PM

## 2018-03-11 NOTE — Evaluation (Deleted)
Physical Therapy Evaluation Patient Details Name: Daniel [name] MRN: [id number] DOB: 12/23/1955 Today's Date: 03/11/2018   History of Present Illness  Pt is a 62 y.o. male with history of hypertension, COPD, ongoing tobacco abuse and alcohol abuse, and CKD.  He presented to the ED with right arm and leg weakness. Stroke work up underway  Clinical Impression  Pt admitted with above diagnosis. Pt currently with functional limitations due to the deficits listed below (see PT Problem List). Pt was able to sit EOB noting right LE decr sensation and proprioception and strength overall right LE 3-/5.  Pt cannot maintain right LE control with ataxia noted.  Will follow and recommend CIR.   Pt will benefit from skilled PT to increase their independence and safety with mobility to allow discharge to the venue listed below.      Follow Up Recommendations CIR    Equipment Recommendations  None recommended by PT    Recommendations for Other Services       Precautions / Restrictions Precautions Precautions: Fall Restrictions Weight Bearing Restrictions: No      Mobility  Bed Mobility Overal bed mobility: Needs Assistance Bed Mobility: Supine to Sit;Sit to Supine     Supine to sit: Supervision;HOB elevated Sit to supine: Min assist   General bed mobility comments: increased time to come to EOB using momentum.  Needed assist for right LE to get back into bed.  Transfers Overall transfer level: Needs assistance Equipment used: Rolling walker (2 wheeled) Transfers: Sit to/from Stand Sit to Stand: Mod assist         General transfer comment: Pt was able to stand but right LE with ataxia and needs cues to keep right LE activated.    Ambulation/Gait Ambulation/Gait assistance: Independent Ambulation Distance (Feet): 250 Feet Assistive device: None Gait Pattern/deviations: WFL(Within Functional Limits)   Gait velocity interpretation: >2.62 ft/sec, indicative of community  ambulatory General Gait Details: Unable(Per OT report, pt was dragging his right LE with ambulation)  Stairs Stairs: Yes Stairs assistance: Supervision Stair Management: No rails Number of Stairs: 5 General stair comments: mildly unsteady on descent. Pt demo step to pattern to accomodate. Pt reports this is common for him due to medication. No overt LOB or assist needed.   Wheelchair Mobility    Modified Rankin (Stroke Patients Only) Modified Rankin (Stroke Patients Only) Pre-Morbid Rankin Score: No symptoms Modified Rankin: Moderately severe disability     Balance Overall balance assessment: Needs assistance Sitting-balance support: No upper extremity supported;Feet supported Sitting balance-Leahy Scale: Fair       Standing balance-Leahy Scale: Poor                               Pertinent Vitals/Pain Pain Assessment: No/denies pain    Home Living Family/patient expects to be discharged to:: Private residence Living Arrangements: Spouse/significant other Available Help at Discharge: Family;Available 24 hours/day Type of Home: House Home Access: Stairs to enter Entrance Stairs-Rails: Psychiatric nurse of Steps: 4 Home Layout: Bed/bath upstairs Home Equipment: None(but pt has access to all DME)      Prior Function Level of Independence: Independent         Comments: Pt is the [employer] for [employer]. Very active. Responsible for animals on property     Hand Dominance   Dominant Hand: Right    Extremity/Trunk Assessment   Upper Extremity Assessment Upper Extremity Assessment: RUE deficits/detail RUE Deficits / Details: isolated movement; apparent  sensorimotor deficits; difficulty maintaining grasp on cup/toothbrush; frequently dropping items; pt had to use L non-dominant hand to brush teth due to incoordination RUE Sensation: decreased proprioception RUE Coordination: decreased fine motor;decreased gross motor     Lower Extremity Assessment Lower Extremity Assessment: RLE deficits/detail RLE Deficits / Details: poor coordination; ataxia; motor impersistence    Cervical / Trunk Assessment Cervical / Trunk Assessment: Other exceptions(R bias when distracted during functional tasks)  Communication   Communication: No difficulties  Cognition Arousal/Alertness: Awake/alert Behavior During Therapy: WFL for tasks assessed/performed Overall Cognitive Status: Impaired/Different from baseline Area of Impairment: Awareness;Problem solving                             Problem Solving: Slow processing General Comments: will further assess      General Comments General comments (skin integrity, edema, etc.): Right hip strength 3-/5, knee 3-/5.  Sensation in feet and hand and forearm decr as well as proprioception. Dysmetria with UE movement to target.      Exercises     Assessment/Plan    PT Assessment Patient needs continued PT services  PT Problem List         PT Treatment Interventions Gait training;DME instruction;Therapeutic activities;Therapeutic exercise;Patient/family education;Balance training;Stair training;Functional mobility training;Neuromuscular re-education    PT Goals (Current goals can be found in the Care Plan section)  Acute Rehab PT Goals Patient Stated Goal: to return to work PT Goal Formulation: With patient    Frequency Min 4X/week   Barriers to discharge        Co-evaluation               AM-PAC PT "6 Clicks" Daily Activity  Outcome Measure Difficulty turning over in bed (including adjusting bedclothes, sheets and blankets)?: None Difficulty moving from lying on back to sitting on the side of the bed? : None Difficulty sitting down on and standing up from a chair with arms (e.g., wheelchair, bedside commode, etc,.)?: A Lot Help needed moving to and from a bed to chair (including a wheelchair)?: A Lot Help needed walking in hospital room?: A  Lot Help needed climbing 3-5 steps with a railing? : Total 6 Click Score: 15    End of Session Equipment Utilized During Treatment: Gait belt Activity Tolerance: Patient tolerated treatment well Patient left: in bed;with call bell/phone within reach;with bed alarm set Nurse Communication: Mobility status PT Visit Diagnosis: Difficulty in walking, not elsewhere classified (R26.2);Dizziness and giddiness (R42)    Time: 5643-3295 PT Time Calculation (min) (ACUTE ONLY): 11 min   Charges:   PT Evaluation $PT Eval Moderate Complexity: 1 Mod     PT G Codes:        Gordy Goar,PT Acute Rehabilitation [phone] (pager)   Denice Paradise 03/11/2018, 3:37 PM

## 2018-03-11 NOTE — Progress Notes (Signed)
SLP Cancellation Note  Patient Details Name: Daniel [name] MRN: [id number] DOB: Nov 27, 1955   Cancelled treatment:       Reason Eval/Treat Not Completed: SLP screened, no needs identified, will sign off   [name], [name] 03/11/2018, 12:14 PM

## 2018-03-11 NOTE — ED Notes (Signed)
Admitting MD Kakrakandy at bedside.  

## 2018-03-12 ENCOUNTER — Observation Stay (HOSPITAL_COMMUNITY): Payer: 59

## 2018-03-12 DIAGNOSIS — G459 Transient cerebral ischemic attack, unspecified: Secondary | ICD-10-CM | POA: Diagnosis not present

## 2018-03-12 DIAGNOSIS — I639 Cerebral infarction, unspecified: Secondary | ICD-10-CM | POA: Diagnosis not present

## 2018-03-12 DIAGNOSIS — M50221 Other cervical disc displacement at C4-C5 level: Secondary | ICD-10-CM | POA: Diagnosis not present

## 2018-03-12 DIAGNOSIS — M4802 Spinal stenosis, cervical region: Secondary | ICD-10-CM | POA: Diagnosis not present

## 2018-03-12 HISTORY — DX: Cerebral infarction, unspecified: I63.9

## 2018-03-12 LAB — BASIC METABOLIC PANEL
Anion gap: 10 (ref 5–15)
BUN: 17 mg/dL (ref 6–20)
CO2: 23 mmol/L (ref 22–32)
Calcium: 9.1 mg/dL (ref 8.9–10.3)
Chloride: 105 mmol/L (ref 101–111)
Creatinine, Ser: 1.15 mg/dL (ref 0.61–1.24)
GFR calc Af Amer: >60 mL/min (ref >60)
GFR calc non Af Amer: >60 mL/min (ref >60)
Glucose, Bld: 98 mg/dL (ref 65–99)
Potassium: 4 mmol/L (ref 3.5–5.1)
Sodium: 138 mmol/L (ref 135–145)

## 2018-03-12 MED ORDER — ASPIRIN 81 MG PO TBEC: 81.0000 mg | DELAYED_RELEASE_TABLET | Freq: Every day | ORAL | 0 refills | Status: AC

## 2018-03-12 MED ORDER — SENNOSIDES-DOCUSATE SODIUM 8.6-50 MG PO TABS
1.0000 | ORAL_TABLET | Freq: Every evening | ORAL | Status: DC | PRN
  Administered 2018-03-12: 1 via ORAL
  Filled 2018-03-12: qty 1

## 2018-03-12 MED ORDER — ZOLPIDEM TARTRATE 5 MG PO TABS: 5.0000 mg | ORAL_TABLET | Freq: Once | ORAL | Status: DC

## 2018-03-12 MED ORDER — FOLIC ACID 1 MG PO TABS: 1.0000 mg | ORAL_TABLET | Freq: Every day | ORAL | 0 refills | Status: DC

## 2018-03-12 MED ORDER — CLOPIDOGREL BISULFATE 75 MG PO TABS: 75.0000 mg | ORAL_TABLET | Freq: Every day | ORAL | 0 refills | Status: DC

## 2018-03-12 MED ORDER — THIAMINE HCL 100 MG PO TABS: 100.0000 mg | ORAL_TABLET | Freq: Every day | ORAL | 0 refills | Status: DC

## 2018-03-12 MED ORDER — ATORVASTATIN CALCIUM 80 MG PO TABS: 80.0000 mg | ORAL_TABLET | Freq: Every day | ORAL | 0 refills | Status: DC

## 2018-03-12 NOTE — Discharge Summary (Signed)
Physician Discharge Summary  Daniel [name] PVV:[id number] DOB: [birth year] DOA: 03/10/2018  PCP: [name], MD  Admit date: 03/10/2018 Discharge date: 03/12/2018  Admitted From: Home Disposition:  Home  Discharge Condition:Stable CODE STATUS:FULL Diet recommendation: Heart Healthy  Brief/Interim Summary:  Patient is a 62 year old male with past medical history of hypertension, COPD, ongoing tobacco abuse, alcohol abuse, CKD stage II ,bilateral renal artery stenosis who  presents to the emergency department with complaints of weakness of the right upper and lower extremity .Patient's symptoms had completely resolved when he presented to the emergency department.  He was admitted for possible TIA.  Neurology consulted. Patient brain imagings did not show any acute stroke or vessel occlusion.  Echocardiogram showed normal wall thickness, normal systolic function with ejection fraction of 60 to 55%,no wall motion abnormality.  Carotid Doppler did not show any significant carotid artery stenosis. Patient again had transient episode of weakness of his right lower extremity and upper extremity after a day of admission.  He was suspected to have possible capsular syndrome.  Repeat MRI of the brain and MRI C-spine was done.MRI showed 6 mm acute/early subacute infarction on left posterior limb of internal capsule and right thalamus. Patient was evaluated by PT/OT and he does not require any follow-up.  He has been started on Lipitor, aspirin and Plavix.  Patient is stable for discharge to home today.  Following problems were addressed during his hospitalization:   1. Acute ischemic stroke/TIA : Imagings finding as above.  Evaluated by PT/OT and no recommendation for follow-up.  Currently does not have any focal neurological deficits.  Continue aspirin and Plavix for 3 weeks and then continue on Plavix alone.  Follow-up with neurology as outpatient.  Continue statin.  2. Acute on chronic renal  failure stage II: Kidney function improved to baseline with IV fluids.  Patient has history of bilateral renal artery stenosis and follows with nephrology.   3. History of alcohol abuse : Started on thiamine and folic acid.  Counseled for quitting alcohol consumption.   4. COPD not actively wheezing.  Continue home inhalers.  Patient has been requested for quitting smoking.  5. History of renal artery stenosis: Follows as an outpatient .   6. Hypertension -Allowed permissive hypertension for 24 hours.BP stable now.   On amlodipine and clonidine which will be continued.    Discharge Diagnoses:  Principal Problem:   Acute ischemic stroke [facility]) Active Problems:   COPD (chronic obstructive pulmonary disease) (HCC)   Hypertension   Right sided weakness   TIA (transient ischemic attack)   ARF (acute renal failure) ([place])   Alcohol abuse    Discharge Instructions  Discharge Instructions    Ambulatory referral to Neurology   Complete by:  As directed    An appointment is requested in approximately: 2 weeks   Diet - low sodium heart healthy   Complete by:  As directed    Discharge instructions   Complete by:  As directed    1) Please take prescribed medications as instructed.  Take aspirin and Plavix for 3 weeks and then continue only on Plavix. 2) Follow up with your PMD in 2-3 days.Do a BMP test in a week. 3) Follow up with neurology as an outpatient.  Name and number of the provider group has been attached. 4) Monitor your blood pressure at home.You can continue clonidine from tomorrow if your blood pressure remains high. 5) Stop drinking alcohol and smoking.   Increase activity slowly   Complete by:  As directed      Allergies as of 03/12/2018   No Known Allergies     Medication List    TAKE these medications   ADVAIR DISKUS 250-50 MCG/DOSE Aepb Generic drug:  Fluticasone-Salmeterol TAKE 1 PUFF BY MOUTH TWICE A DAY   albuterol 108 (90 Base) MCG/ACT inhaler Commonly  known as:  PROVENTIL HFA;VENTOLIN HFA Inhale 2 puffs into the lungs every 6 (six) hours as needed for wheezing or shortness of breath.   amLODipine 10 MG tablet Commonly known as:  NORVASC TAKE 1 TABLET BY MOUTH EVERY DAY   aspirin 81 MG EC tablet Take 1 tablet (81 mg total) by mouth daily for 21 days. Start taking on:  03/13/2018   atorvastatin 80 MG tablet Commonly known as:  LIPITOR Take 1 tablet (80 mg total) by mouth daily at 6 PM.   butalbital-acetaminophen-caffeine 50-325-40-30 MG capsule Commonly known as:  FIORICET/CODEINE Take 1 capsule by mouth every 4 (four) hours as needed for headache.   chlorthalidone 25 MG tablet Commonly known as:  HYGROTON TAKE ONE TABLET (25 MG DOSE) BY MOUTH DAILY.   cloNIDine 0.1 MG tablet Commonly known as:  CATAPRES Take 1 tablet (0.1 mg total) by mouth 2 (two) times daily. What changed:    how much to take  when to take this   clopidogrel 75 MG tablet Commonly known as:  PLAVIX Take 1 tablet (75 mg total) by mouth daily. Start taking on:  1/61/0960   folic acid 1 MG tablet Commonly known as:  FOLVITE Take 1 tablet (1 mg total) by mouth daily. Start taking on:  03/13/2018   thiamine 100 MG tablet Take 1 tablet (100 mg total) by mouth daily. Start taking on:  03/13/2018      Follow-up Information    Stacks, Cletus Gash, MD. Schedule an appointment as soon as possible for a visit in 1 week(s).   Specialty:  Family Medicine Contact information: [address] [place] [address] [phone]        [facility]. Schedule an appointment as soon as possible for a visit in 3 week(s).   Specialty:  Neurology Contact information: [address] [place] [phone]         No Known Allergies  Consultations: Neurology  Procedures/Studies: Ct Head Wo Contrast  Result Date: 03/10/2018 CLINICAL DATA:  Sudden onset stroke-like symptoms with weakness and numbness in the  left hand and arm. EXAM: CT HEAD WITHOUT CONTRAST TECHNIQUE: Contiguous axial images were obtained from the base of the skull through the vertex without intravenous contrast. COMPARISON:  12/13/2017 FINDINGS: Brain: Redemonstration of mild-to-moderate chronic appearing small vessel ischemic disease of periventricular white matter. No acute intracranial hemorrhage, large vascular territory infarct, intra-axial mass nor extra-axial fluid collections. Midline fourth ventricle basal cisterns. No encephalomalacia. Vascular: No hyperdense vessel sign. Calcified atherosclerosis at the skull base. Skull: Intact without suspicious osseous abnormality. Sinuses/Orbits: No acute finding. Other: None. IMPRESSION: Chronic stable mild-to-moderate small vessel ischemic disease. No significant change from prior. No acute intracranial abnormality is identified. Electronically Signed   By: Ashley Royalty M.D.   On: 03/10/2018 19:59   Mr Angiogram Head Wo Contrast  Result Date: 03/11/2018 CLINICAL DATA:  Initial evaluation for acute right arm and leg dysfunction EXAM: MRI HEAD WITHOUT CONTRAST MRA HEAD WITHOUT CONTRAST TECHNIQUE: Multiplanar, multiecho pulse sequences of the brain and surrounding structures were obtained without intravenous contrast. Angiographic images of the head were obtained using MRA technique without contrast.  COMPARISON:  Prior CT from 03/10/2018 FINDINGS: MRI HEAD FINDINGS Brain: Generalized age appropriate cerebral atrophy with mild to moderate chronic small vessel ischemic change. No abnormal foci of restricted diffusion to suggest acute or subacute ischemia. Gray-white matter differentiation maintained. No evidence for remote cortical infarction. No acute or chronic intracranial hemorrhage. No mass lesion, midline shift or mass effect. No hydrocephalus. No extra-axial fluid collection. Major dural sinuses are grossly patent. Incidental note made of a partially empty sella. Midline structures intact and  normal. Vascular: Major intracranial vascular flow voids are maintained. Skull and upper cervical spine: Craniocervical junction within normal limits. Mild multilevel degenerate spondylolysis noted within the upper cervical spine without significant stenosis. Bone marrow signal intensity within normal limits. No scalp soft tissue abnormality. Sinuses/Orbits: Globes and orbital soft tissues within normal limits. Mild mucosal thickening within the ethmoidal air cells and maxillary sinuses bilaterally. Paranasal sinuses are otherwise clear. Trace opacity right mastoid air cells, of doubtful significance. Inner ear structures normal. Other: None. MRA HEAD FINDINGS ANTERIOR CIRCULATION: Distal cervical segments of the internal carotid arteries widely patent bilaterally. Petrous segments widely patent. Mild atheromatous irregularity within the cavernous ICAs with relatively mild multifocal narrowing. ICA termini widely patent. A1 segments widely patent, with the right being dominant. Normal anterior communicating artery. Anterior cerebral arteries widely patent to their distal aspects. No M1 stenosis or occlusion. No proximal M2 occlusion. Distal MCA branches well perfused and symmetric. POSTERIOR CIRCULATION: Vertebral arteries widely patent to the vertebrobasilar junction. Right vert is dominant. Posterior inferior cerebral arteries patent proximally. Basilar artery patent to its distal aspect without stenosis. Superior cerebral arteries patent bilaterally. Both of the PCAs primarily supplied via the basilar. Mild atherosclerotic change within the PCAs bilaterally without high-grade stenosis. No aneurysm. IMPRESSION: MRI HEAD IMPRESSION 1. No acute intracranial abnormality. 2. Mild to moderate chronic small vessel ischemic change. MRA HEAD IMPRESSION 1. Negative intracranial MRA for large vessel occlusion. 2. Mild atherosclerotic change for age without hemodynamically significant or correctable stenosis. Electronically  Signed   By: Jeannine Boga M.D.   On: 03/11/2018 02:05   Mr Brain Wo Contrast  Result Date: 03/12/2018 CLINICAL DATA:  62 y/o  M; right leg dragging and right hand ataxia. EXAM: MRI HEAD WITHOUT CONTRAST TECHNIQUE: Multiplanar, multiecho pulse sequences of the brain and surrounding structures were obtained without intravenous contrast. COMPARISON:  03/11/2018 MRI head. FINDINGS: Brain: 6 mm foci of reduced diffusion are present within the right posteromedial thalamus and the left posterior limb of internal capsule compatible with acute/early subacute infarction. Several nonspecific foci of T2 FLAIR hyperintense signal abnormality in subcortical and periventricular white matter are compatible with mild to moderate chronic microvascular ischemic changes for age. Mild-to-moderate brain parenchymal volume loss. Vascular: Normal flow voids. Skull and upper cervical spine: Normal marrow signal. Sinuses/Orbits: Mild maxillary sinus mucosal thickening. Other: None. IMPRESSION: 1. 6 mm acute/early subacute infarctions within left posterior limb of internal capsule and right posteromedial thalamus. No hemorrhage or mass effect. 2. Stable mild-to-moderate chronic microvascular ischemic changes and parenchymal volume loss of the brain. These results will be called to the ordering clinician or representative by the Radiologist Assistant, and communication documented in the PACS or zVision Dashboard. Electronically Signed   By: Kristine Garbe M.D.   On: 03/12/2018 05:00   Mr Brain Wo Contrast  Result Date: 03/11/2018 CLINICAL DATA:  Initial evaluation for acute right arm and leg dysfunction EXAM: MRI HEAD WITHOUT CONTRAST MRA HEAD WITHOUT CONTRAST TECHNIQUE: Multiplanar, multiecho pulse sequences of the brain  and surrounding structures were obtained without intravenous contrast. Angiographic images of the head were obtained using MRA technique without contrast. COMPARISON:  Prior CT from 03/10/2018  FINDINGS: MRI HEAD FINDINGS Brain: Generalized age appropriate cerebral atrophy with mild to moderate chronic small vessel ischemic change. No abnormal foci of restricted diffusion to suggest acute or subacute ischemia. Gray-white matter differentiation maintained. No evidence for remote cortical infarction. No acute or chronic intracranial hemorrhage. No mass lesion, midline shift or mass effect. No hydrocephalus. No extra-axial fluid collection. Major dural sinuses are grossly patent. Incidental note made of a partially empty sella. Midline structures intact and normal. Vascular: Major intracranial vascular flow voids are maintained. Skull and upper cervical spine: Craniocervical junction within normal limits. Mild multilevel degenerate spondylolysis noted within the upper cervical spine without significant stenosis. Bone marrow signal intensity within normal limits. No scalp soft tissue abnormality. Sinuses/Orbits: Globes and orbital soft tissues within normal limits. Mild mucosal thickening within the ethmoidal air cells and maxillary sinuses bilaterally. Paranasal sinuses are otherwise clear. Trace opacity right mastoid air cells, of doubtful significance. Inner ear structures normal. Other: None. MRA HEAD FINDINGS ANTERIOR CIRCULATION: Distal cervical segments of the internal carotid arteries widely patent bilaterally. Petrous segments widely patent. Mild atheromatous irregularity within the cavernous ICAs with relatively mild multifocal narrowing. ICA termini widely patent. A1 segments widely patent, with the right being dominant. Normal anterior communicating artery. Anterior cerebral arteries widely patent to their distal aspects. No M1 stenosis or occlusion. No proximal M2 occlusion. Distal MCA branches well perfused and symmetric. POSTERIOR CIRCULATION: Vertebral arteries widely patent to the vertebrobasilar junction. Right vert is dominant. Posterior inferior cerebral arteries patent proximally. Basilar  artery patent to its distal aspect without stenosis. Superior cerebral arteries patent bilaterally. Both of the PCAs primarily supplied via the basilar. Mild atherosclerotic change within the PCAs bilaterally without high-grade stenosis. No aneurysm. IMPRESSION: MRI HEAD IMPRESSION 1. No acute intracranial abnormality. 2. Mild to moderate chronic small vessel ischemic change. MRA HEAD IMPRESSION 1. Negative intracranial MRA for large vessel occlusion. 2. Mild atherosclerotic change for age without hemodynamically significant or correctable stenosis. Electronically Signed   By: Jeannine Boga M.D.   On: 03/11/2018 02:05   Mr Cervical Spine Wo Contrast  Result Date: 03/12/2018 CLINICAL DATA:  62 y/o M; right leg dragging and right hand ataxia. Loss of right hand coordination. EXAM: MRI CERVICAL SPINE WITHOUT CONTRAST TECHNIQUE: Multiplanar, multisequence MR imaging of the cervical spine was performed. No intravenous contrast was administered. COMPARISON:  03/11/2018 MRI head and MRA head. 09/23/2015 MRI of the cervical spine. FINDINGS: Alignment: Straightening of cervical lordosis without listhesis. Vertebrae: C5-C7 anterior cervical discectomy and fusion. Susceptibility artifact from the fusion hardware partially obscures the vertebral bodies at those levels. Mild degenerative endplate edema within the inferior C3 vertebral body. Left-sided C4-5 mild facet edema, likely degenerative. Cord: Normal signal and morphology. Posterior Fossa, vertebral arteries, paraspinal tissues: Negative. Disc levels: C2-3: Left-sided uncovertebral and facet hypertrophy with mild left foraminal stenosis. No canal stenosis. C3-4: Minimal disc osteophyte complex and predominant left-sided facet hypertrophy. Mild left foraminal stenosis. No canal stenosis. C4-5: Disc osteophyte complex with left-greater-than-right uncovertebral and facet hypertrophy. Moderate left foraminal stenosis and mild canal stenosis. C5-6: Discectomy  infusion. Bilateral uncovertebral hypertrophy with mild foraminal stenosis. No canal stenosis. C6-7: Discectomy and fusion. Bilateral uncovertebral hypertrophy with moderate foraminal stenosis. No canal stenosis. C7-T1: No significant disc displacement, foraminal stenosis, or canal stenosis. IMPRESSION: 1. Left-sided C4-5 mild facet edema, likely degenerative. 2. No additional  abnormal bone marrow signal or cord signal identified. 3. Cervical spondylosis with multilevel disc and facet degenerative changes post C5-C7 anterior cervical discectomy and fusion. 4. Moderate left C4-5 and bilateral C6-7 foraminal stenosis. Multilevel mild foraminal stenosis. 5. Mild C4-5 canal stenosis.  No high-grade canal stenosis. Electronically Signed   By: Kristine Garbe M.D.   On: 03/12/2018 04:32   Dg Chest Port 1 View  Result Date: 03/11/2018 CLINICAL DATA:  62 year old male with COPD. EXAM: PORTABLE CHEST 1 VIEW COMPARISON:  Chest CT dated 05/11/2014 FINDINGS: There is mild diffuse interstitial coarsening. No focal consolidation, pleural effusion, or pneumothorax. The cardiac silhouette is within normal limits. No acute osseous pathology. IMPRESSION: No active disease. Electronically Signed   By: Anner Crete M.D.   On: 03/11/2018 06:33       Subjective: Patient seen and examined at bedside this morning.  Remains comfortable.  He was able to walk without problem with PT this morning. Stable for discharge home today.  Discharge Exam: Vitals:   03/12/18 1040 03/12/18 1111  BP: (!) 147/93   Pulse:    Resp:    Temp:    SpO2:  99%   Vitals:   03/12/18 0338 03/12/18 0750 03/12/18 1040 03/12/18 1111  BP: (!) 152/90 (!) 159/86 (!) 147/93   Pulse: (!) 52 81    Resp: 18 14    Temp: 98.1 F (36.7 C) 98 F (36.7 C)    TempSrc: Oral Oral    SpO2: 100% 98%  99%  Weight:      Height:        General: Pt is alert, awake, not in acute distress Cardiovascular: RRR, S1/S2 +, no rubs, no  gallops Respiratory: CTA bilaterally, no wheezing, no rhonchi Abdominal: Soft, NT, ND, bowel sounds + Extremities: no edema, no cyanosis    The results of significant diagnostics from this hospitalization (including imaging, microbiology, ancillary and laboratory) are listed below for reference.     Microbiology: No results found for this or any previous visit (from the past 240 hour(s)).   Labs: BNP (last 3 results) No results for input(s): BNP in the last 8760 hours. Basic Metabolic Panel: Recent Labs  Lab 03/10/18 1731 03/10/18 1744 03/11/18 0430 03/12/18 0642  NA 139 139 137 138  K 3.4* 3.6 3.0* 4.0  CL 103 101 99* 105  CO2 24  --  28 23  GLUCOSE 100* 100* 115* 98  BUN 29* 31* 25* 17  CREATININE 1.82* 1.70* 1.56* 1.15  CALCIUM 9.4  --  9.3 9.1   Liver Function Tests: Recent Labs  Lab 03/10/18 1731 03/11/18 0430  AST 28 23  ALT 20 18  ALKPHOS 72 65  BILITOT 1.3* 1.7*  PROT 7.0 6.7  ALBUMIN 4.4 4.1   No results for input(s): LIPASE, AMYLASE in the last 168 hours. No results for input(s): AMMONIA in the last 168 hours. CBC: Recent Labs  Lab 03/10/18 1731 03/10/18 1744 03/11/18 0430  WBC 7.5  --  5.2  NEUTROABS 5.8  --   --   HGB 14.2 14.6 14.3  HCT 41.2 43.0 41.5  MCV 89.8  --  90.0  PLT 163  --  159   Cardiac Enzymes: Recent Labs  Lab 03/11/18 0614  TROPONINI <0.03   BNP: Invalid input(s): POCBNP CBG: No results for input(s): GLUCAP in the last 168 hours. D-Dimer No results for input(s): DDIMER in the last 72 hours. Hgb A1c Recent Labs    03/11/18 0430  HGBA1C 5.3  Lipid Profile Recent Labs    03/11/18 0430  CHOL 196  HDL 51  LDLCALC 134*  TRIG 55  CHOLHDL 3.8   Thyroid function studies No results for input(s): TSH, T4TOTAL, T3FREE, THYROIDAB in the last 72 hours.  Invalid input(s): FREET3 Anemia work up No results for input(s): VITAMINB12, FOLATE, FERRITIN, TIBC, IRON, RETICCTPCT in the last 72 hours. Urinalysis     Component Value Date/Time   COLORURINE YELLOW 03/11/2018 0419   APPEARANCEUR CLEAR 03/11/2018 0419   LABSPEC 1.018 03/11/2018 0419   PHURINE 5.0 03/11/2018 0419   GLUCOSEU NEGATIVE 03/11/2018 0419   HGBUR NEGATIVE 03/11/2018 0419   BILIRUBINUR NEGATIVE 03/11/2018 0419   BILIRUBINUR neg 07/18/2013 0839   KETONESUR NEGATIVE 03/11/2018 0419   PROTEINUR NEGATIVE 03/11/2018 0419   UROBILINOGEN negative 07/18/2013 0839   NITRITE NEGATIVE 03/11/2018 0419   LEUKOCYTESUR NEGATIVE 03/11/2018 0419   Sepsis Labs Invalid input(s): PROCALCITONIN,  WBC,  LACTICIDVEN Microbiology No results found for this or any previous visit (from the past 240 hour(s)).   Time coordinating discharge: 35 minutes  SIGNED:   Shelly Coss, MD  [facility] 03/12/2018, 3:10 PM Pager [phone]  If 7PM-7AM, please contact night-coverage [email or web address] Password [email or web address]

## 2018-03-12 NOTE — Progress Notes (Signed)
STROKE TEAM PROGRESS NOTE   SUBJECTIVE (INTERVAL HISTORY) Pt is feeling better, he said his R weakness is gone now and he feels 100% back at baseline, he has slight headaches. Wife at bedside, she was updated about MRI findings and BP plan, I showed her the MRI and explained to her about the findings.   OBJECTIVE Temp:  [98.1 F (36.7 C)-98.8 F (37.1 C)] 98.1 F (36.7 C) (05/11 0338) Pulse Rate:  [50-61] 52 (05/11 0338) Cardiac Rhythm: Sinus bradycardia (05/10 1928) Resp:  [18] 18 (05/11 0338) BP: (132-152)/(78-92) 152/90 (05/11 0338) SpO2:  [96 %-100 %] 100 % (05/11 0338)  CBC:  Recent Labs  Lab 03/10/18 1731 03/10/18 1744 03/11/18 0430  WBC 7.5  --  5.2  NEUTROABS 5.8  --   --   HGB 14.2 14.6 14.3  HCT 41.2 43.0 41.5  MCV 89.8  --  90.0  PLT 163  --  301    Basic Metabolic Panel:  Recent Labs  Lab 03/10/18 1731 03/10/18 1744 03/11/18 0430  NA 139 139 137  K 3.4* 3.6 3.0*  CL 103 101 99*  CO2 24  --  28  GLUCOSE 100* 100* 115*  BUN 29* 31* 25*  CREATININE 1.82* 1.70* 1.56*  CALCIUM 9.4  --  9.3    Lipid Panel:     Component Value Date/Time   CHOL 196 03/11/2018 0430   TRIG 55 03/11/2018 0430   TRIG 74 07/18/2013 0818   HDL 51 03/11/2018 0430   HDL 65 07/18/2013 0818   CHOLHDL 3.8 03/11/2018 0430   VLDL 11 03/11/2018 0430   LDLCALC 134 (H) 03/11/2018 0430   LDLCALC 133 (H) 07/18/2013 0818   HgbA1c:  Lab Results  Component Value Date   HGBA1C 5.3 03/11/2018   Urine Drug Screen:     Component Value Date/Time   LABOPIA NONE DETECTED 03/11/2018 0419   COCAINSCRNUR NONE DETECTED 03/11/2018 0419   LABBENZ NONE DETECTED 03/11/2018 0419   AMPHETMU NONE DETECTED 03/11/2018 0419   THCU NONE DETECTED 03/11/2018 0419   LABBARB NONE DETECTED 03/11/2018 0419    Alcohol Level No results found for: Zebulon I have personally reviewed the radiological images below and agree with the radiology interpretations.  Ct Head Wo  Contrast 03/10/2018 IMPRESSION:  Chronic stable mild-to-moderate small vessel ischemic disease. No significant change from prior. No acute intracranial abnormality is identified.   MRI HEAD IMPRESSION  1. No acute intracranial abnormality.  2. Mild to moderate chronic small vessel ischemic change.   MRA HEAD IMPRESSION  1. Negative intracranial MRA for large vessel occlusion.  2. Mild atherosclerotic change for age without hemodynamically significant or correctable stenosis.   Transthoracic Echocardiogram  - LVEF 60-65%, normal wall thickness, normal wall motion, normal   diastolic function, trivial MR, moderate biatrial enlargement,   trivial TR, RVSP 40 mmHg, dilated IVC.  Bilateral Carotid Dopplers   Findings suggest 1-39% internal carotid artery stenosis bilaterally. Vertebral arteries are patent with antegrade flow.  Repeat MRI 5/11  6 mm acute/early subacute infarctions within left posterior limb of internal capsule and right posteromedial thalamus  PHYSICAL EXAM Vitals:   03/11/18 1955 03/11/18 2006 03/11/18 2355 03/12/18 0338  BP: (!) 148/92  139/82 (!) 152/90  Pulse: 61  (!) 50 (!) 52  Resp: 18  18 18   Temp: 98.2 F (36.8 C)  98.7 F (37.1 C) 98.1 F (36.7 C)  TempSrc: Oral  Oral Oral  SpO2: 96% 97% 100% 100%  Weight:  Height:        General - NAD Heart - Regular rate and rhythm - no murmer appreciated Lungs - Clear to auscultation but with decreased breath sounds throughout. Abdomen - Soft - non tender Extremities - Distal pulses weak to absent - no edema Skin - Warm and dry  Mental Status: Alert, oriented, thought content appropriate.  Speech fluent without evidence of aphasia.  Able to follow 3 step commands without difficulty. Cranial Nerves: II: Discs not visualized; Visual fields grossly normal, pupils equal, round, reactive to light. III,IV, VI: ptosis not present, extra-ocular motions intact bilaterally V,VII: no facial droop  VIII: hearing  normal bilaterally IX,X: gag reflex present XI: bilateral shoulder shrug intact. XII: midline tongue extension Motor: 5/5 all groups, no drift  Sensory: Light touch intact throughout, bilaterally Gait: not tested   ASSESSMENT/PLAN [name] is a 62 y.o. male with history of pulmonary nodule, ongoing tobacco use, ETOH use, hypertension, hyperlipidemia, COPD, CKD with likely renal artery stenosis presenting with intermittent right-sided weakness. He did not receive IV t-PA due to resolution of deficits and late presentation. However, at lunch time, his right sided weakness returned.  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   MRI head - No acute intracranial abnormality  MRA head - negative  Repeat MRI 5/11 6 mm acute/early subacute infarctions within left posterior limb of internal capsule and right posteromedial thalamus, B/L lacunar stroke with normal carotid U/S and MRA brain, unlikely to be embolic   Continue ASA/ plavix and Lipitor   Carotid Doppler unremarkable  2D Echo EF 60-65%  LDL - 134  HgbA1c - 5.3  VTE prophylaxis - Lovenox  No antithrombotic prior to admission  Patient counseled to be compliant with his antithrombotic medications  Ongoing aggressive stroke risk factor management  Therapy recommendations:  CIR  Disposition:  Awaiting PT/OT eval, Activity: as tolerated, no bed rest needed any more, can be D/C if SBP is stable for 24H off clonidine    Hypertension  Stable . Permissive hypertension (OK if < 220/120) but gradually normalize in 5-7 days . Pt is off clonidine, SBP 130-150, might tolerated being off BP meds  . Long-term BP goal normotensive  Hyperlipidemia  Lipid lowering medication PTA:  none  LDL 134, goal < 70  Continue Lipitor 80 mg daily   Tobacco abuse  Current heavy smoker  Smoking cessation  counseling provided  Pt is willing to quit  Other Stroke Risk Factors  Advanced age  ETOH use, advised to drink no more than 1 alcoholic beverage per day.  Other Active Problems  Hypokalemia - supplemented  CKD - Cre 1.82->1.70->1.56 now 1.15  Renal artery stenosis ( per patient history)  Hospital day # 1   Arnaldo Natal, MD    To contact Stroke Continuity provider, please refer to [email or web address] After hours, contact General Neurology

## 2018-03-12 NOTE — Progress Notes (Deleted)
   03/11/18 1500  PT Visit Information  Last PT Received On 03/11/18  Assistance Needed +1  History of Present Illness Pt is a 62 y.o. male with history of hypertension, COPD, ongoing tobacco abuse and alcohol abuse, and CKD.  He presented to the ED with right arm and leg weakness. Stroke work up underway  Subjective Data  Patient Stated Goal to return to work  Precautions  Precautions Fall  Restrictions  Weight Bearing Restrictions No  Pain Assessment  Pain Assessment No/denies pain  Cognition  Arousal/Alertness Awake/alert  Behavior During Therapy WFL for tasks assessed/performed  Overall Cognitive Status Impaired/Different from baseline  Area of Impairment Awareness;Problem solving  Problem Solving Slow processing  General Comments will further assess  Bed Mobility  Overal bed mobility Needs Assistance  Bed Mobility Supine to Sit;Sit to Supine  Supine to sit Supervision;HOB elevated  Sit to supine Min assist  General bed mobility comments increased time to come to EOB using momentum.  Needed assist for right LE to get back into bed.  Transfers  Overall transfer level Needs assistance  Equipment used Rolling walker (2 wheeled)  Transfers Sit to/from Stand  Sit to Stand Mod assist  General transfer comment Pt was able to stand but right LE with ataxia and needs cues to keep right LE activated.    Ambulation/Gait  General Gait Details Unable (Per OT report, pt was dragging his right LE with ambulation)  Modified Rankin (Stroke Patients Only)  Pre-Morbid Rankin Score 0  Modified Rankin 4  Balance  Overall balance assessment Needs assistance  Sitting-balance support No upper extremity supported;Feet supported  Sitting balance-Leahy Scale Fair  General Comments  General comments (skin integrity, edema, etc.) Right hip strength 3-/5, knee 3-/5.  Sensation in feet and hand and forearm decr as well as proprioception. Dysmetria with UE movement to target.    PT - End of Session   Equipment Utilized During Treatment Gait belt  Activity Tolerance Patient tolerated treatment well  Patient left in bed;with call bell/phone within reach;with bed alarm set  Nurse Communication Mobility status   PT - Assessment/Plan  PT Plan Discharge plan needs to be updated  PT Visit Diagnosis Difficulty in walking, not elsewhere classified (R26.2);Dizziness and giddiness (R42)  PT Frequency (ACUTE ONLY) Min 4X/week  Follow Up Recommendations CIR  PT equipment None recommended by PT  AM-PAC PT "6 Clicks" Daily Activity Outcome Measure  Difficulty turning over in bed (including adjusting bedclothes, sheets and blankets)? 4  Difficulty moving from lying on back to sitting on the side of the bed?  4  Difficulty sitting down on and standing up from a chair with arms (e.g., wheelchair, bedside commode, etc,.)? 2  Help needed moving to and from a bed to chair (including a wheelchair)? 2  Help needed walking in hospital room? 2  Help needed climbing 3-5 steps with a railing?  1  6 Click Score 15  Mobility G Code  CK  PT Goal Progression  Progress towards PT goals Not progressing toward goals - comment (limited to bed this pm due to evolving symptoms)  Acute Rehab PT Goals  PT Goal Formulation With patient  PT Time Calculation  PT Start Time (ACUTE ONLY) 1412  PT Stop Time (ACUTE ONLY) 1423  PT Time Calculation (min) (ACUTE ONLY) 11 min  PT General Charges  $$ ACUTE PT VISIT 1 Visit  PT Evaluation  $PT Eval Moderate Complexity 1 Mod

## 2018-03-12 NOTE — Progress Notes (Addendum)
PT Evaluation  Previous note deleted as it entered am information and this pt was re-evaluated in the pm due to change in symptoms.  This is the note that should have been entered with correct information from session in pm on 5/10.     03/11/18 1500  PT Visit Information  Last PT Received On 03/11/18  Assistance Needed +1  History of Present Illness Pt is a 62 y.o. male with history of hypertension, COPD, ongoing tobacco abuse and alcohol abuse, and CKD.  He presented to the ED with right arm and leg weakness. Stroke work up underway  Subjective Data  Patient Stated Goal to return to work  Precautions  Precautions Fall  Restrictions  Weight Bearing Restrictions No  Pain Assessment  Pain Assessment No/denies pain  Cognition  Arousal/Alertness Awake/alert  Behavior During Therapy WFL for tasks assessed/performed  Overall Cognitive Status Impaired/Different from baseline  Area of Impairment Awareness;Problem solving  Problem Solving Slow processing  General Comments will further assess  Bed Mobility  Overal bed mobility Needs Assistance  Bed Mobility Supine to Sit;Sit to Supine  Supine to sit Supervision;HOB elevated  Sit to supine Min assist  General bed mobility comments increased time to come to EOB using momentum.  Needed assist for right LE to get back into bed.  Transfers  Overall transfer level Needs assistance  Equipment used Rolling walker (2 wheeled)  Transfers Sit to/from Stand  Sit to Stand Mod assist  General transfer comment Pt was able to stand but right LE with ataxia and needs cues to keep right LE activated.    Ambulation/Gait  General Gait Details Unable (Per OT report, pt was dragging his right LE with ambulation)  Modified Rankin (Stroke Patients Only)  Pre-Morbid Rankin Score 0  Modified Rankin 4  Balance  Overall balance assessment Needs assistance  Sitting-balance support No upper extremity supported;Feet supported  Sitting balance-Leahy Scale Fair   General Comments  General comments (skin integrity, edema, etc.) Right hip strength 3-/5, knee 3-/5.  Sensation in feet and hand and forearm decr as well as proprioception. Dysmetria with UE movement to target.    PT - End of Session  Equipment Utilized During Treatment Gait belt  Activity Tolerance Patient tolerated treatment well  Patient left in bed;with call bell/phone within reach;with bed alarm set  Nurse Communication Mobility status   PT - Assessment/Plan  PT Plan Discharge plan needs to be updated  PT Visit Diagnosis Difficulty in walking, not elsewhere classified (R26.2);Dizziness and giddiness (R42)  PT Frequency (ACUTE ONLY) Min 4X/week  Follow Up Recommendations CIR  PT equipment None recommended by PT  AM-PAC PT "6 Clicks" Daily Activity Outcome Measure  Difficulty turning over in bed (including adjusting bedclothes, sheets and blankets)? 4  Difficulty moving from lying on back to sitting on the side of the bed?  4  Difficulty sitting down on and standing up from a chair with arms (e.g., wheelchair, bedside commode, etc,.)? 2  Help needed moving to and from a bed to chair (including a wheelchair)? 2  Help needed walking in hospital room? 2  Help needed climbing 3-5 steps with a railing?  1  6 Click Score 15  Mobility G Code  CK  PT Goal Progression  Progress towards PT goals Not progressing toward goals - comment (limited to bed this pm due to evolving symptoms)  Acute Rehab PT Goals  PT Goal Formulation With patient  PT Time Calculation  PT Start Time (ACUTE ONLY) 1412  PT Stop Time (ACUTE ONLY) 1423  PT Time Calculation (min) (ACUTE ONLY) 11 min  PT General Charges  $$ ACUTE PT VISIT 1 Visit  PT Evaluation  $PT Eval Moderate Complexity 1 Mod

## 2018-03-12 NOTE — Progress Notes (Signed)
Physical Therapy Treatment Patient Details Name: Daniel [name] MRN: [id number] DOB: 01/01/1956 Today's Date: 03/12/2018    History of Present Illness Pt is a 62 y.o. male with history of hypertension, COPD, ongoing tobacco abuse and alcohol abuse, and CKD.  He presented to the ED with right arm and leg weakness. Initial MRI negative. Repeat MRI showed 6 mm acute/early subacute infarctions within left posterior limb of internal capsule and right posteromedial thalamus.     PT Comments    Pt evaluated by PT yesterday AM and found to be independent. Upon OT assessment in the afternoon, pt with significant change, exhibiting stroke signs/symptoms on R side. PT re-assessed in the PM finding strength deficits RLE, assist required for transfers and inability to ambulate. Repeat MRI revealed L CVA. This morning, pt has again returned to baseline, independent with all functional mobility. 5/5 strength noted BLE. DGI repeated with pt scoring 24/24. PT will continue to follow in acute care. Discharge recommendations updated to no follow up services.    Follow Up Recommendations  No PT follow up     Equipment Recommendations  None recommended by PT    Recommendations for Other Services       Precautions / Restrictions Precautions Precautions: Fall    Mobility  Bed Mobility Overal bed mobility: Independent       Supine to sit: Independent Sit to supine: Independent   General bed mobility comments: no difficulty  Transfers Overall transfer level: Independent Equipment used: None   Sit to Stand: Independent         General transfer comment: no difficulty  Ambulation/Gait Ambulation/Gait assistance: Supervision Ambulation Distance (Feet): 300 Feet Assistive device: None Gait Pattern/deviations: WFL(Within Functional Limits)     General Gait Details: steady gait   Stairs   Stairs assistance: Supervision Stair Management: No rails Number of Stairs: 5 General stair  comments: no difficulty   Wheelchair Mobility    Modified Rankin (Stroke Patients Only) Modified Rankin (Stroke Patients Only) Pre-Morbid Rankin Score: No symptoms Modified Rankin: No significant disability     Balance Overall balance assessment: Modified Independent                               Standardized Balance Assessment Standardized Balance Assessment : Dynamic Gait Index   Dynamic Gait Index Level Surface: Normal Change in Gait Speed: Normal Gait with Horizontal Head Turns: Normal Gait with Vertical Head Turns: Normal Gait and Pivot Turn: Normal Step Over Obstacle: Normal Step Around Obstacles: Normal Steps: Normal Total Score: 24      Cognition Arousal/Alertness: Awake/alert Behavior During Therapy: WFL for tasks assessed/performed   Area of Impairment: Safety/judgement                         Safety/Judgement: Decreased awareness of safety            Exercises      General Comments General comments (skin integrity, edema, etc.): BLE strength 5/5      Pertinent Vitals/Pain Pain Assessment: No/denies pain    Home Living                      Prior Function            PT Goals (current goals can now be found in the care plan section) Acute Rehab PT Goals Patient Stated Goal: to return to work PT Goal Formulation: With patient  Progress towards PT goals: Progressing toward goals    Frequency    Min 4X/week      PT Plan Discharge plan needs to be updated    Co-evaluation              AM-PAC PT "6 Clicks" Daily Activity  Outcome Measure  Difficulty turning over in bed (including adjusting bedclothes, sheets and blankets)?: None Difficulty moving from lying on back to sitting on the side of the bed? : None Difficulty sitting down on and standing up from a chair with arms (e.g., wheelchair, bedside commode, etc,.)?: None Help needed moving to and from a bed to chair (including a wheelchair)?:  None Help needed walking in hospital room?: None Help needed climbing 3-5 steps with a railing? : A Little 6 Click Score: 23    End of Session Equipment Utilized During Treatment: Gait belt Activity Tolerance: Patient tolerated treatment well Patient left: in bed;with call bell/phone within reach;with family/visitor present Nurse Communication: Mobility status PT Visit Diagnosis: Difficulty in walking, not elsewhere classified (R26.2);Dizziness and giddiness (R42)     Time: 4742-5956 PT Time Calculation (min) (ACUTE ONLY): 22 min  Charges:  $Gait Training: 8-22 mins                    G Codes:       Lorrin Goodell, PT  Office # [phone] Pager [phone]    Lorriane Shire 03/12/2018, 9:58 AM

## 2018-03-13 NOTE — Progress Notes (Signed)
Pt d/c from unit in wheelchair per staff. Reviewed discharge summary and instructions with patient and family. No new concerns or questions. Verbalized understanding and able to teach back. IV and tele removed. Spouse to transport home.

## 2018-03-16 DIAGNOSIS — I10 Essential (primary) hypertension: Secondary | ICD-10-CM | POA: Diagnosis not present

## 2018-03-16 DIAGNOSIS — Z87891 Personal history of nicotine dependence: Secondary | ICD-10-CM | POA: Diagnosis not present

## 2018-03-16 DIAGNOSIS — I701 Atherosclerosis of renal artery: Secondary | ICD-10-CM | POA: Diagnosis not present

## 2018-03-16 DIAGNOSIS — I739 Peripheral vascular disease, unspecified: Secondary | ICD-10-CM | POA: Diagnosis not present

## 2018-03-23 DIAGNOSIS — I639 Cerebral infarction, unspecified: Secondary | ICD-10-CM | POA: Diagnosis not present

## 2018-03-23 DIAGNOSIS — I701 Atherosclerosis of renal artery: Secondary | ICD-10-CM | POA: Diagnosis not present

## 2018-03-23 DIAGNOSIS — I150 Renovascular hypertension: Secondary | ICD-10-CM | POA: Diagnosis not present

## 2018-03-23 DIAGNOSIS — I129 Hypertensive chronic kidney disease with stage 1 through stage 4 chronic kidney disease, or unspecified chronic kidney disease: Secondary | ICD-10-CM | POA: Diagnosis not present

## 2018-03-23 DIAGNOSIS — J449 Chronic obstructive pulmonary disease, unspecified: Secondary | ICD-10-CM | POA: Diagnosis not present

## 2018-03-23 DIAGNOSIS — E872 Acidosis: Secondary | ICD-10-CM | POA: Diagnosis not present

## 2018-03-23 DIAGNOSIS — I700 Atherosclerosis of aorta: Secondary | ICD-10-CM | POA: Diagnosis not present

## 2018-03-24 ENCOUNTER — Encounter: Payer: Self-pay | Admitting: Family Medicine

## 2018-03-28 ENCOUNTER — Encounter (HOSPITAL_COMMUNITY): Payer: Self-pay | Admitting: Emergency Medicine

## 2018-03-28 ENCOUNTER — Emergency Department (HOSPITAL_COMMUNITY)
Admission: EM | Admit: 2018-03-28 | Discharge: 2018-03-29 | Disposition: A | Discharge status: Home / Self Care | Payer: 59 | Attending: Emergency Medicine | Admitting: Emergency Medicine

## 2018-03-28 ENCOUNTER — Other Ambulatory Visit: Payer: Self-pay

## 2018-03-28 ENCOUNTER — Emergency Department (HOSPITAL_COMMUNITY): Payer: 59

## 2018-03-28 DIAGNOSIS — Z8673 Personal history of transient ischemic attack (TIA), and cerebral infarction without residual deficits: Secondary | ICD-10-CM | POA: Diagnosis not present

## 2018-03-28 DIAGNOSIS — R509 Fever, unspecified: Secondary | ICD-10-CM | POA: Diagnosis present

## 2018-03-28 DIAGNOSIS — M7918 Myalgia, other site: Secondary | ICD-10-CM | POA: Diagnosis not present

## 2018-03-28 DIAGNOSIS — Z7902 Long term (current) use of antithrombotics/antiplatelets: Secondary | ICD-10-CM | POA: Insufficient documentation

## 2018-03-28 DIAGNOSIS — Z87891 Personal history of nicotine dependence: Secondary | ICD-10-CM | POA: Insufficient documentation

## 2018-03-28 DIAGNOSIS — J449 Chronic obstructive pulmonary disease, unspecified: Secondary | ICD-10-CM | POA: Insufficient documentation

## 2018-03-28 DIAGNOSIS — Z7982 Long term (current) use of aspirin: Secondary | ICD-10-CM | POA: Insufficient documentation

## 2018-03-28 DIAGNOSIS — R61 Generalized hyperhidrosis: Secondary | ICD-10-CM | POA: Insufficient documentation

## 2018-03-28 DIAGNOSIS — R531 Weakness: Secondary | ICD-10-CM | POA: Insufficient documentation

## 2018-03-28 DIAGNOSIS — I10 Essential (primary) hypertension: Secondary | ICD-10-CM | POA: Insufficient documentation

## 2018-03-28 DIAGNOSIS — Z79899 Other long term (current) drug therapy: Secondary | ICD-10-CM | POA: Insufficient documentation

## 2018-03-28 HISTORY — DX: Disorder of kidney and ureter, unspecified: N28.9

## 2018-03-28 HISTORY — DX: Cerebral infarction, unspecified: I63.9

## 2018-03-28 LAB — CBC WITH DIFFERENTIAL/PLATELET
Basophils Absolute: 0 10*3/uL (ref 0.0–0.1)
Basophils Relative: 1 %
Eosinophils Absolute: 0.1 10*3/uL (ref 0.0–0.7)
Eosinophils Relative: 2 %
HCT: 41.2 % (ref 39.0–52.0)
Hemoglobin: 14.5 g/dL (ref 13.0–17.0)
Lymphocytes Relative: 22 %
Lymphs Abs: 1.3 10*3/uL (ref 0.7–4.0)
MCH: 30.9 pg (ref 26.0–34.0)
MCHC: 35.2 g/dL (ref 30.0–36.0)
MCV: 87.8 fL (ref 78.0–100.0)
Monocytes Absolute: 0.8 10*3/uL (ref 0.1–1.0)
Monocytes Relative: 14 %
Neutro Abs: 3.6 10*3/uL (ref 1.7–7.7)
Neutrophils Relative %: 61 %
Platelets: 198 10*3/uL (ref 150–400)
RBC: 4.69 MIL/uL (ref 4.22–5.81)
RDW: 13.2 % (ref 11.5–15.5)
WBC: 5.8 10*3/uL (ref 4.0–10.5)

## 2018-03-28 LAB — URINALYSIS, ROUTINE W REFLEX MICROSCOPIC
Bilirubin Urine: NEGATIVE
Glucose, UA: NEGATIVE mg/dL
Hgb urine dipstick: NEGATIVE
Ketones, ur: NEGATIVE mg/dL
Leukocytes, UA: NEGATIVE
Nitrite: NEGATIVE
Protein, ur: 30 mg/dL — AB
Specific Gravity, Urine: 1.021 (ref 1.005–1.030)
pH: 6 (ref 5.0–8.0)

## 2018-03-28 LAB — BASIC METABOLIC PANEL
Anion gap: 9 (ref 5–15)
BUN: 17 mg/dL (ref 6–20)
CO2: 30 mmol/L (ref 22–32)
Calcium: 9.6 mg/dL (ref 8.9–10.3)
Chloride: 98 mmol/L — ABNORMAL LOW (ref 101–111)
Creatinine, Ser: 1.05 mg/dL (ref 0.61–1.24)
GFR calc Af Amer: >60 mL/min (ref >60)
GFR calc non Af Amer: >60 mL/min (ref >60)
Glucose, Bld: 97 mg/dL (ref 65–99)
Potassium: 3.6 mmol/L (ref 3.5–5.1)
Sodium: 137 mmol/L (ref 135–145)

## 2018-03-28 LAB — HEPATIC FUNCTION PANEL
ALT: 29 U/L (ref 17–63)
AST: 29 U/L (ref 15–41)
Albumin: 4.2 g/dL (ref 3.5–5.0)
Alkaline Phosphatase: 79 U/L (ref 38–126)
Bilirubin, Direct: 0.2 mg/dL (ref 0.1–0.5)
Indirect Bilirubin: 1.7 mg/dL — ABNORMAL HIGH (ref 0.3–0.9)
Total Bilirubin: 1.9 mg/dL — ABNORMAL HIGH (ref 0.3–1.2)
Total Protein: 8.1 g/dL (ref 6.5–8.1)

## 2018-03-28 LAB — LACTIC ACID, PLASMA: Lactic Acid, Venous: 1 mmol/L (ref 0.5–1.9)

## 2018-03-28 MED ORDER — BISACODYL 10 MG RE SUPP
10.00 | RECTAL | Status: DC
Start: ? — End: 2018-03-28

## 2018-03-28 MED ORDER — ALUM & MAG HYDROXIDE-SIMETH 200-200-20 MG/5ML PO SUSP
15.0000 mL | Freq: Once | ORAL | Status: AC
  Administered 2018-03-28: 15 mL via ORAL
  Filled 2018-03-28: qty 30

## 2018-03-28 MED ORDER — FAMOTIDINE 20 MG PO TABS
20.00 | ORAL_TABLET | ORAL | Status: DC
Start: 2018-03-26 — End: 2018-03-28

## 2018-03-28 MED ORDER — DOCUSATE SODIUM 100 MG PO CAPS
100.00 | ORAL_CAPSULE | ORAL | Status: DC
Start: 2018-03-26 — End: 2018-03-28

## 2018-03-28 MED ORDER — CLOPIDOGREL BISULFATE 75 MG PO TABS
75.00 | ORAL_TABLET | ORAL | Status: DC
Start: 2018-03-27 — End: 2018-03-28

## 2018-03-28 MED ORDER — FOLIC ACID 1 MG PO TABS
1.00 | ORAL_TABLET | ORAL | Status: DC
Start: 2018-03-27 — End: 2018-03-28

## 2018-03-28 MED ORDER — ONDANSETRON HCL 4 MG/2ML IJ SOLN
4.00 | INTRAMUSCULAR | Status: DC
Start: ? — End: 2018-03-28

## 2018-03-28 MED ORDER — MORPHINE SULFATE 2 MG/ML IJ SOLN
2.00 | INTRAMUSCULAR | Status: DC
Start: ? — End: 2018-03-28

## 2018-03-28 MED ORDER — ACETAMINOPHEN 325 MG PO TABS
650.0000 mg | ORAL_TABLET | Freq: Once | ORAL | Status: AC
  Administered 2018-03-28: 650 mg via ORAL
  Filled 2018-03-28: qty 2

## 2018-03-28 MED ORDER — SENNOSIDES 8.6 MG PO TABS
1.00 | ORAL_TABLET | ORAL | Status: DC
Start: 2018-03-26 — End: 2018-03-28

## 2018-03-28 MED ORDER — CLONIDINE HCL 0.1 MG PO TABS
0.10 | ORAL_TABLET | ORAL | Status: DC
Start: 2018-03-26 — End: 2018-03-28

## 2018-03-28 MED ORDER — ACETAMINOPHEN 325 MG PO TABS
650.00 | ORAL_TABLET | ORAL | Status: DC
Start: ? — End: 2018-03-28

## 2018-03-28 MED ORDER — ASPIRIN EC 81 MG PO TBEC
81.00 | DELAYED_RELEASE_TABLET | ORAL | Status: DC
Start: 2018-03-27 — End: 2018-03-28

## 2018-03-28 MED ORDER — MIDAZOLAM HCL 5 MG/5ML IJ SOLN
INTRAMUSCULAR | Status: DC
Start: ? — End: 2018-03-28

## 2018-03-28 MED ORDER — BENZOCAINE 10 % MT LIQD
OROMUCOSAL | Status: DC
Start: ? — End: 2018-03-28

## 2018-03-28 MED ORDER — HYDROCODONE-ACETAMINOPHEN 5-325 MG PO TABS
1.00 | ORAL_TABLET | ORAL | Status: DC
Start: ? — End: 2018-03-28

## 2018-03-28 MED ORDER — POLYETHYLENE GLYCOL 3350 17 G PO PACK
17.00 | PACK | ORAL | Status: DC
Start: 2018-03-27 — End: 2018-03-28

## 2018-03-28 MED ORDER — BUDESONIDE-FORMOTEROL FUMARATE 160-4.5 MCG/ACT IN AERO
2.00 | INHALATION_SPRAY | RESPIRATORY_TRACT | Status: DC
Start: 2018-03-26 — End: 2018-03-28

## 2018-03-28 MED ORDER — CYCLOBENZAPRINE HCL 5 MG PO TABS
5.00 | ORAL_TABLET | ORAL | Status: DC
Start: ? — End: 2018-03-28

## 2018-03-28 MED ORDER — THIAMINE HCL 100 MG PO TABS
100.00 | ORAL_TABLET | ORAL | Status: DC
Start: 2018-03-27 — End: 2018-03-28

## 2018-03-28 MED ORDER — ATORVASTATIN CALCIUM 40 MG PO TABS
80.00 | ORAL_TABLET | ORAL | Status: DC
Start: 2018-03-26 — End: 2018-03-28

## 2018-03-28 NOTE — ED Triage Notes (Addendum)
Patient states he was discharged from hospital 2 days ago with renal stents. Complaining of weakness and fever. Denies pain. States he was at [facility] in [place] and number to call is [phone] for questions.

## 2018-03-28 NOTE — ED Provider Notes (Signed)
[facility] EMERGENCY DEPARTMENT Provider Note   CSN: [id number] Arrival date & time: 03/28/18  1748     History   Chief Complaint Chief Complaint  Patient presents with  . Weakness    HPI Daniel [name] is a 62 y.o. male.  Patient presents here tonight with concerns for low-grade fevers 100.4-100.6 since having renal artery stents placed that he is [employer] on Thursday.  On Friday patient had multiple teeth pulled by the dental service there.  And that evening he had chills and even had a syncopal episode.  Sounds as if his lactic acid was elevated was March but was discharged home on Saturday.  On Saturday evening he started to have chills and fevers at home.  The procedure was done through his right groin femoral area.  Patient's blood pressure is done better since the stents.  Patient had stents done for malignant hypertension and known renal artery stenosis they were bilateral stents.  Done by the vascular service at [facility].  Patient denies any upper respiratory symptoms denies any dysuria.  Denies any rash.  Just has been getting kind of the chills and fever and a little bit of sweats mostly at night.     Past Medical History:  Diagnosis Date  . BPH (benign prostatic hyperplasia)   . COPD (chronic obstructive pulmonary disease) ([place])   . Hyperlipidemia   . Hypertension   . Pulmonary nodule   . renal stents   . Stroke [facility])     Patient Active Problem List   Diagnosis Date Noted  . Acute ischemic stroke ([place]) 03/12/2018  . Right sided weakness 03/11/2018  . TIA (transient ischemic attack) 03/11/2018  . ARF (acute renal failure) ([place]) 03/11/2018  . Alcohol abuse 03/11/2018  . Hypertension 02/07/2018  . Cervical stenosis of spine 01/17/2016  . BPH (benign prostatic hyperplasia) 07/20/2013  . Abnormal chest CT 07/20/2013  . Hyperlipidemia 07/20/2013  . COPD (chronic obstructive pulmonary disease) ([place]) 12/08/2011  . Smoker 12/08/2011    . Multiple pulmonary nodules 12/08/2011    Past Surgical History:  Procedure Laterality Date  . CERVICAL FUSION    . NO PAST SURGERIES    . renal stents          Home Medications    Prior to Admission medications   Medication Sig Start Date End Date Taking? Authorizing Provider  acetaminophen (TYLENOL) 500 MG tablet Take 500 mg by mouth every 6 (six) hours as needed for mild pain or moderate pain.   Yes [provider]  ADVAIR DISKUS 250-50 MCG/DOSE AEPB TAKE 1 PUFF BY MOUTH TWICE A DAY 11/29/17  Yes [name], MD  albuterol (PROVENTIL HFA;VENTOLIN HFA) 108 (90 Base) MCG/ACT inhaler Inhale 2 puffs into the lungs every 6 (six) hours as needed for wheezing or shortness of breath. 03/04/17  Yes Eustaquio Maize, MD  aspirin EC 81 MG EC tablet Take 1 tablet (81 mg total) by mouth daily for 21 days. 03/13/18 04/03/18 Yes Shelly Coss, MD  atorvastatin (LIPITOR) 80 MG tablet Take 1 tablet (80 mg total) by mouth daily at 6 PM. 03/12/18  Yes Adhikari, Amrit, MD  chlorhexidine (PERIDEX) 0.12 % solution 15 mL by Swish & Spit route twice a day for 7 days. 03/26/18 04/02/18 Yes [provider]  clopidogrel (PLAVIX) 75 MG tablet Take 1 tablet (75 mg total) by mouth daily. 03/13/18  Yes Shelly Coss, MD  cyclobenzaprine (FLEXERIL) 5 MG tablet Take 5 mg by mouth at bedtime as  needed for muscle spasms.  03/26/18  Yes [provider]  docusate sodium (COLACE) 100 MG capsule Take 100 mg by mouth 2 (two) times daily.   Yes [provider]  folic acid (FOLVITE) 073 MCG tablet Take 400 mcg by mouth daily.   Yes [provider]  polyethylene glycol (MIRALAX / GLYCOLAX) packet Take 17 g by mouth daily.   Yes [provider]  thiamine 100 MG tablet Take 1 tablet (100 mg total) by mouth daily. 03/13/18  Yes Adhikari, Tamsen Meek, MD  amLODipine (NORVASC) 10 MG tablet TAKE 1 TABLET BY MOUTH EVERY DAY Patient not taking: Reported on 03/28/2018 02/01/18   [name], MD  chlorthalidone (HYGROTON) 25 MG tablet TAKE ONE TABLET (25 MG DOSE) BY MOUTH DAILY. 01/12/18   [provider]  cloNIDine (CATAPRES) 0.1 MG tablet Take 1 tablet (0.1 mg total) by mouth 2 (two) times daily. Patient not taking: Reported on 03/28/2018 12/31/17   [name], MD  folic acid (FOLVITE) 1 MG tablet Take 1 tablet (1 mg total) by mouth daily. Patient not taking: Reported on 03/28/2018 03/13/18   Shelly Coss, MD    Family History Family History  Problem Relation Age of Onset  . Emphysema Father   . Cancer Father   . Lung cancer Mother     Social History Social History   Tobacco Use  . Smoking status: Former Smoker    Packs/day: 1.00    Years: 42.00    Pack years: 42.00    Types: Cigarettes  . Smokeless tobacco: Never Used  Substance Use Topics  . Alcohol use: Never    Alcohol/week: 7.0 oz    Types: 14 Standard drinks or equivalent per week    Frequency: Never  . Drug use: Never     Allergies   Patient has no known allergies.   Review of Systems Review of Systems  Constitutional: Positive for chills, diaphoresis and fever.  HENT: Negative for congestion.   Eyes: Negative for redness.  Respiratory: Negative for shortness of breath.   Cardiovascular: Negative for chest pain.  Gastrointestinal: Negative for abdominal pain.  Genitourinary: Negative for dysuria.  Musculoskeletal: Positive for myalgias.  Skin: Negative for rash.  Neurological: Negative for headaches.  Hematological: Does not bruise/bleed easily.  Psychiatric/Behavioral: Negative for confusion.     Physical Exam Updated Vital Signs BP (!) 138/94   Pulse 100   Temp 98.9 F (37.2 C) (Oral)   Resp 20   Ht 1.778 m (5\' 10" )   Wt 71.3 kg (157 lb 3 oz)   SpO2 100%   BMI 22.55 kg/m   Physical Exam  Constitutional: He is oriented to person, place, and time. He appears well-developed and well-nourished. No distress.  HENT:  Head: Normocephalic and atraumatic.   Mouth/Throat: Oropharynx is clear and moist.  Eyes: Pupils are equal, round, and reactive to light. Conjunctivae and EOM are normal.  Neck: Normal range of motion. Neck supple.  Cardiovascular: Normal rate, regular rhythm and normal heart sounds.  Pulmonary/Chest: Effort normal and breath sounds normal. No respiratory distress.  Abdominal: Soft. Bowel sounds are normal. There is no tenderness.  Musculoskeletal: Normal range of motion. He exhibits no edema.  Some old bruising to the right groin area no pulsatile mass.  Neurological: He is alert and oriented to person, place, and time. No cranial nerve deficit or sensory deficit. He exhibits normal muscle tone. Coordination normal.  Skin: Skin is warm.  Nursing note and vitals reviewed.  ED Treatments / Results  Labs (all labs ordered are listed, but only abnormal results are displayed) Labs Reviewed  BASIC METABOLIC PANEL - Abnormal; Notable for the following components:      Result Value   Chloride 98 (*)    All other components within normal limits  URINALYSIS, ROUTINE W REFLEX MICROSCOPIC - Abnormal; Notable for the following components:   Protein, ur 30 (*)    Bacteria, UA RARE (*)    All other components within normal limits  HEPATIC FUNCTION PANEL - Abnormal; Notable for the following components:   Total Bilirubin 1.9 (*)    Indirect Bilirubin 1.7 (*)    All other components within normal limits  CULTURE, BLOOD (ROUTINE X 2)  CULTURE, BLOOD (ROUTINE X 2)  URINE CULTURE  CBC WITH DIFFERENTIAL/PLATELET  LACTIC ACID, PLASMA  . Results for orders placed or performed during the hospital encounter of 03/28/18  Culture, blood (Routine X 2) w Reflex to ID Panel  Result Value Ref Range   Specimen Description BLOOD RIGHT ARM    Special Requests      BOTTLES DRAWN AEROBIC AND ANAEROBIC Blood Culture adequate volume Performed at [facility], [address], [place], [place] [address]    Culture PENDING    Report Status  PENDING   Culture, blood (Routine X 2) w Reflex to ID Panel  Result Value Ref Range   Specimen Description LEFT ANTECUBITAL    Special Requests      BOTTLES DRAWN AEROBIC AND ANAEROBIC Blood Culture adequate volume Performed at [facility], [address], [place], [place] [address]    Culture PENDING    Report Status PENDING   CBC with Differential  Result Value Ref Range   WBC 5.8 4.0 - 10.5 K/uL   RBC 4.69 4.22 - 5.81 MIL/uL   Hemoglobin 14.5 13.0 - 17.0 g/dL   HCT 41.2 39.0 - 52.0 %   MCV 87.8 78.0 - 100.0 fL   MCH 30.9 26.0 - 34.0 pg   MCHC 35.2 30.0 - 36.0 g/dL   RDW 13.2 11.5 - 15.5 %   Platelets 198 150 - 400 K/uL   Neutrophils Relative % 61 %   Neutro Abs 3.6 1.7 - 7.7 K/uL   Lymphocytes Relative 22 %   Lymphs Abs 1.3 0.7 - 4.0 K/uL   Monocytes Relative 14 %   Monocytes Absolute 0.8 0.1 - 1.0 K/uL   Eosinophils Relative 2 %   Eosinophils Absolute 0.1 0.0 - 0.7 K/uL   Basophils Relative 1 %   Basophils Absolute 0.0 0.0 - 0.1 K/uL  Basic metabolic panel  Result Value Ref Range   Sodium 137 135 - 145 mmol/L   Potassium 3.6 3.5 - 5.1 mmol/L   Chloride 98 (L) 101 - 111 mmol/L   CO2 30 22 - 32 mmol/L   Glucose, Bld 97 65 - 99 mg/dL   BUN 17 6 - 20 mg/dL   Creatinine, Ser 1.05 0.61 - 1.24 mg/dL   Calcium 9.6 8.9 - 10.3 mg/dL   GFR calc non Af Amer >60 >60 mL/min   GFR calc Af Amer >60 >60 mL/min   Anion gap 9 5 - 15  Urinalysis, Routine w reflex microscopic  Result Value Ref Range   Color, Urine YELLOW YELLOW   APPearance CLEAR CLEAR   Specific Gravity, Urine 1.021 1.005 - 1.030   pH 6.0 5.0 - 8.0   Glucose, UA NEGATIVE NEGATIVE mg/dL   Hgb urine dipstick NEGATIVE NEGATIVE  Bilirubin Urine NEGATIVE NEGATIVE   Ketones, ur NEGATIVE NEGATIVE mg/dL   Protein, ur 30 (A) NEGATIVE mg/dL   Nitrite NEGATIVE NEGATIVE   Leukocytes, UA NEGATIVE NEGATIVE   RBC / HPF 0-5 0 - 5 RBC/hpf   WBC, UA 0-5 0 - 5 WBC/hpf   Bacteria, UA RARE (A) NONE SEEN   Squamous Epithelial  / LPF 0-5 0 - 5   Mucus PRESENT    Hyaline Casts, UA PRESENT   Lactic acid, plasma  Result Value Ref Range   Lactic Acid, Venous 1.0 0.5 - 1.9 mmol/L  Hepatic function panel  Result Value Ref Range   Total Protein 8.1 6.5 - 8.1 g/dL   Albumin 4.2 3.5 - 5.0 g/dL   AST 29 15 - 41 U/L   ALT 29 17 - 63 U/L   Alkaline Phosphatase 79 38 - 126 U/L   Total Bilirubin 1.9 (H) 0.3 - 1.2 mg/dL   Bilirubin, Direct 0.2 0.1 - 0.5 mg/dL   Indirect Bilirubin 1.7 (H) 0.3 - 0.9 mg/dL    EKG EKG Interpretation  Date/Time:  Monday Mar 28 2018 18:08:46 EDT Ventricular Rate:  96 PR Interval:  114 QRS Duration: 76 QT Interval:  346 QTC Calculation: 437 R Axis:   77 Text Interpretation:  Normal sinus rhythm Biatrial enlargement Abnormal ECG Confirmed by Fredia Sorrow [phone]) on 03/28/2018 10:06:33 PM   Radiology Dg Chest 2 View  Result Date: 03/28/2018 CLINICAL DATA:  Fever. Discharge from hospital 2 days ago with renal stents. Weakness and fever. EXAM: CHEST - 2 VIEW COMPARISON:  03/11/2018 FINDINGS: Heart size and pulmonary vascularity are normal. Linear fibrosis in the left lung base. Lungs are otherwise clear and expanded. No blunting of costophrenic angles. No pneumothorax. Mediastinal contours appear intact. Postoperative changes in the cervical spine. Degenerative changes in the thoracic spine. IMPRESSION: No active cardiopulmonary disease. Electronically Signed   By: Lucienne Capers M.D.   On: 03/28/2018 22:51    Procedures Procedures (including critical care time)  Medications Ordered in ED Medications  acetaminophen (TYLENOL) tablet 650 mg (650 mg Oral Given 03/28/18 2242)  alum & mag hydroxide-simeth (MAALOX/MYLANTA) 200-200-20 MG/5ML suspension 15 mL (15 mLs Oral Given 03/28/18 2242)     Initial Impression / Assessment and Plan / ED Course  I have reviewed the triage vital signs and the nursing notes.  Pertinent labs & imaging results that were available during my care of the  patient were reviewed by me and considered in my medical decision making (see chart for details).    [place] with vascular surgery on call Dr. Bridgett Larsson the stent [employer] concern for infection.  Probably also explains why they had his teeth pulled the next day.  Patient did receive clindamycin vancomycin prior to the stenting procedure but was not placed on antibiotics if the teeth were pulled.  Although the fever and chills and sweats are somewhat concerning for bacteremia.  Extensive work-up here no heart murmur no leukocytosis no documented fever here chest x-ray negative for pneumonia urinalysis negative for urinary tract infection ever function test are normal electrolytes are normal.  Renal function normal.  Discussed with the hospitalist for questionable admission for observation and additional blood cultures were done in the standard 2 sets he felt that patient did not warrant antibiotics at this time follow-up with primary care doctor if the blood cultures grow something then patient will certainly need additional outpatient work-up.  Here lactic acid was normal.  Patient nontoxic no acute distress.  Patient  will be discharged home patient will return for any new or worse symptoms.  Patient will make an appointment follow-up with her primary care doctor.  One concern I had was the possibility of bacterial endocarditis but without any hard-core symptoms hospitalist stated that patient did not require admission for additional blood cultures and echocardiogram in the morning.   Final Clinical Impressions(s) / ED Diagnoses   Final diagnoses:  None    ED Discharge Orders    None       Fredia Sorrow, MD 03/29/18 [phone]

## 2018-03-29 NOTE — Discharge Instructions (Addendum)
Make an appointment to follow-up with your doctor.  Return for any new or worse symptoms to include higher fevers worse chills and shakes.  Discussed both with the on-call vascular surgery and as well as the internal medicine admitting doctor here blood cultures have been done if they grow anything you will be notified.  All parties recommended no antibiotics at this time.

## 2018-03-30 ENCOUNTER — Encounter: Payer: Self-pay | Admitting: Adult Health

## 2018-03-30 ENCOUNTER — Ambulatory Visit: Payer: 59 | Admitting: Adult Health

## 2018-03-30 VITALS — BP 102/60 | HR 88 | Ht 70.5 in | Wt 159.0 lb

## 2018-03-30 DIAGNOSIS — I639 Cerebral infarction, unspecified: Secondary | ICD-10-CM

## 2018-03-30 DIAGNOSIS — I10 Essential (primary) hypertension: Secondary | ICD-10-CM

## 2018-03-30 DIAGNOSIS — E785 Hyperlipidemia, unspecified: Secondary | ICD-10-CM

## 2018-03-30 DIAGNOSIS — M62838 Other muscle spasm: Secondary | ICD-10-CM | POA: Diagnosis not present

## 2018-03-30 LAB — URINE CULTURE: Culture: NO GROWTH

## 2018-03-30 MED ORDER — BACLOFEN 5 MG PO TABS: 5.0000 mg | ORAL_TABLET | Freq: Two times a day (BID) | ORAL | 2 refills | Status: DC

## 2018-03-30 NOTE — Patient Instructions (Signed)
Continue clopidogrel 75 mg daily  and lipitor  for secondary stroke prevention  Stop aspirin and continue plavix only at this time  Start baclofen 5mg  twice a day for 1 week for muscle spasms - if tolerating okay you can take this 3 times per day  Schedule appointment for EMG/NVC for muscle spasms  Continue to follow up with PCP regarding cholesterol and blood pressure management   Continue to monitor blood pressure at home  Maintain strict control of hypertension with blood pressure goal below 130/90, diabetes with hemoglobin A1c goal below 6.5% and cholesterol with LDL cholesterol (bad cholesterol) goal below 70 mg/dL. I also advised the patient to eat a healthy diet with plenty of whole grains, cereals, fruits and vegetables, exercise regularly and maintain ideal body weight.  Followup in the future with me in 2 months or call earlier if needed          Thank you for coming to see [place] at [facility]. I hope we have been able to provide you high quality care today.  You may receive a patient satisfaction survey over the next few weeks. We would appreciate your feedback and comments so that we may continue to improve ourselves and the health of our patients.

## 2018-03-30 NOTE — Progress Notes (Signed)
[facility] [address] [place]. [place] [address] [phone]       OFFICE FOLLOW UP NOTE  Daniel [name] Date of Birth:  1956-02-19 Medical Record Number:  [id number]   Reason for Referral:  hospital stroke follow up  CHIEF COMPLAINT:  Chief Complaint  Patient presents with  . Follow-up    right ataxic hemiparesis follow up pt was seen in the hospital by Dr. 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 History obtained from patient, [name] and chart review. Reviewed all radiology images and labs personally.  Daniel [name] is a 62 y.o. male with history of pulmonary nodule, ongoing tobacco use, ETOH use, hypertension, hyperlipidemia, COPD, CKD with likely renal artery stenosis presenting with intermittent right-sided weakness. He did not receive IV t-PA due to resolution of deficits and late presentation. However, at lunch time, his right sided weakness returned. CT head reviewed and showed chronic stable mild to moderate small vessel ischemic disease with no significant change from prior imaging.  Initial MRI showed no acute intercranial abnormality.  Repeat MRI did show acute/early subacute infarctions within the left posterior limb of internal capsule and right posteromedial thalamus.  Carotid Dopplers are unremarkable.  2D echo showed an EF of 60 to 65%.  Patient was not on antithrombotic prior to admission but due to concerns of capsular warning syndrome, he was loaded with aspirin and Plavix along with Lipitor 80 mg and recommended to continue DAPT for 3 weeks and then Plavix alone.  LDL 134 and recommended to continue Lipitor 80 mg at discharge.  Therapies recommended CIR placement.   Patient is being seen today for hospital follow-up visit and is accompanied by his [name].  All symptoms have resolved and he is back to doing all  normal activities.  He does complain of daily headaches since his stroke.  Denies phonophobia or nausea vomiting but does endorse photophobia.  He has been taking extra strength Tylenol which helps relieve these headaches.  They are not debilitating as he can continue to do certain activities but also states he has not been as active due to recent renal stent placement.  Patient also complains of muscle cramps that he states is been occurring for the past 5 years where his muscles will contract and become very painfu.  This typically happens when patient has increased fatigue or with increased activity.  They are also worse in the evening or with cold temperature.  He has recently been taking Flexeril without much relief.  Patient states he has had multiple blood tests in the past by primary providers but all negative.  Continues to take both aspirin and Plavix without side effects of bleeding or bruising.  Continues to take Lipitor without increased side effects of myalgias.  Blood pressure today at beginning of visit 153/93 and at recheck at end of visit 102/60.  States he has not smoked cigarettes since hospital discharge.  Denies new or worsening stroke/TIA symptoms.  ROS:   14 system review of systems performed and negative with exception of muscle cramps and headache  PMH:  Past Medical History:  Diagnosis Date  . BPH (benign prostatic hyperplasia)   . COPD (chronic obstructive pulmonary disease) ([place])   . Hyperlipidemia   . Hypertension   . Pulmonary nodule   . renal stents   . Stroke [facility])  PSH:  Past Surgical History:  Procedure Laterality Date  . CERVICAL FUSION    . NO PAST SURGERIES    . renal stents      Social History:  Social History   Socioeconomic History  . Marital status: Married    Spouse name: Not on file  . Number of children: Not on file  . Years of education: Not on file  . Highest education level: Not on file  Occupational History  . Occupation: Development worker, community  . Financial resource strain: Not on file  . Food insecurity:    Worry: Not on file    Inability: Not on file  . Transportation needs:    Medical: Not on file    Non-medical: Not on file  Tobacco Use  . Smoking status: Former Smoker    Packs/day: 1.00    Years: 42.00    Pack years: 42.00    Types: Cigarettes  . Smokeless tobacco: Never Used  Substance and Sexual Activity  . Alcohol use: Never    Frequency: Never    Comment: not dirnking now  . Drug use: Never  . Sexual activity: Not on file  Lifestyle  . Physical activity:    Days per week: Not on file    Minutes per session: Not on file  . Stress: Not on file  Relationships  . Social connections:    Talks on phone: Not on file    Gets together: Not on file    Attends religious service: Not on file    Active member of club or organization: Not on file    Attends meetings of clubs or organizations: Not on file    Relationship status: Not on file  . Intimate partner violence:    Fear of current or ex partner: Not on file    Emotionally abused: Not on file    Physically abused: Not on file    Forced sexual activity: Not on file  Other Topics Concern  . Not on file  Social History Narrative  . Not on file    Family History:  Family History  Problem Relation Age of Onset  . Emphysema Father   . Cancer Father   . Lung cancer Mother     Medications:   Current Outpatient Medications on File Prior to Visit  Medication Sig Dispense Refill  . acetaminophen (TYLENOL) 500 MG tablet Take 500 mg by mouth every 6 (six) hours as needed for mild pain or moderate pain.    Marland Kitchen ADVAIR DISKUS 250-50 MCG/DOSE AEPB TAKE 1 PUFF BY MOUTH TWICE A DAY 60 each 5  . albuterol (PROVENTIL HFA;VENTOLIN HFA) 108 (90 Base) MCG/ACT inhaler Inhale 2 puffs into the lungs every 6 (six) hours as needed for wheezing or shortness of breath. 1 Inhaler 2  . aspirin EC 81 MG EC tablet Take 1 tablet (81 mg total) by mouth daily for 21 days.  21 tablet 0  . atorvastatin (LIPITOR) 80 MG tablet Take 1 tablet (80 mg total) by mouth daily at 6 PM. 30 tablet 0  . chlorhexidine (PERIDEX) 0.12 % solution 15 mL by Swish & Spit route twice a day for 7 days.    . clopidogrel (PLAVIX) 75 MG tablet Take 1 tablet (75 mg total) by mouth daily. 30 tablet 0  . cyclobenzaprine (FLEXERIL) 5 MG tablet Take 5 mg by mouth at bedtime as needed for muscle spasms.     Marland Kitchen docusate sodium (COLACE) 100 MG capsule Take  100 mg by mouth 2 (two) times daily.    . folic acid (FOLVITE) 1 MG tablet Take 1 tablet (1 mg total) by mouth daily. 30 tablet 0  . folic acid (FOLVITE) 732 MCG tablet Take 400 mcg by mouth daily.    . polyethylene glycol (MIRALAX / GLYCOLAX) packet Take 17 g by mouth daily.    Marland Kitchen thiamine 100 MG tablet Take 1 tablet (100 mg total) by mouth daily. 30 tablet 0   No current facility-administered medications on file prior to visit.     Allergies:  No Known Allergies   Physical Exam  Vitals:   03/30/18 1404 03/30/18 1454  BP: (!) 152/93 102/60  Pulse: 88 88  Weight: 159 lb 8 oz (72.3 kg) 159 lb (72.1 kg)  Height: 5' 10.5" (1.791 m) 5' 10.5" (1.791 m)   Body mass index is 22.49 kg/m. No exam data present  General: Frail middle-aged Caucasian male, seated, in no evident distress Head: head normocephalic and atraumatic.   Neck: supple with no carotid or supraclavicular bruits Cardiovascular: regular rate and rhythm, no murmurs Musculoskeletal: no deformity Skin:  no rash/petichiae Vascular:  Normal pulses all extremities  Neurologic Exam Mental Status: Awake and fully alert. Oriented to place and time. Recent and remote memory intact. Attention span, concentration and fund of knowledge appropriate. Mood and affect appropriate.  Cranial Nerves: Fundoscopic exam reveals sharp disc margins. Pupils equal, briskly reactive to light. Extraocular movements full without nystagmus. Visual fields full to confrontation. Hearing intact. Facial  sensation intact. Face, tongue, palate moves normally and symmetrically.  Motor: Normal bulk and tone. Normal strength in all tested extremity muscles. Sensory.: intact to touch , pinprick , position and vibratory sensation.  Coordination: Rapid alternating movements normal in all extremities. Finger-to-nose and heel-to-shin performed accurately bilaterally. Gait and Station: Arises from chair without difficulty. Stance is normal. Gait demonstrates normal stride length and balance . Able to heel, toe and tandem walk without difficulty.  Reflexes: 1+ and symmetric. Toes downgoing.    NIHSS  0 Modified Rankin  1    Diagnostic Data (Labs, Imaging, Testing)  Ct Head Wo Contrast 03/10/2018 IMPRESSION:  Chronic stable mild-to-moderate small vessel ischemic disease. No significant change from prior. No acute intracranial abnormality is identified.   MRI HEAD 03/11/2018 1. No acute intracranial abnormality.  2. Mild to moderate chronic small vessel ischemic change.   MRI HEAD (REPEAT) 03/12/2018 IMPRESSION: 1. 6 mm acute/early subacute infarctions within left posterior limb of internal capsule and right posteromedial thalamus. No hemorrhage or mass effect. 2. Stable mild-to-moderate chronic microvascular ischemic changes and parenchymal volume loss of the brain.  MRA HEAD IMPRESSION  1. Negative intracranial MRA for large vessel occlusion.  2. Mild atherosclerotic change for age without hemodynamically significant or correctable stenosis.   Transthoracic Echocardiogram  - LVEF 60-65%, normal wall thickness, normal wall motion, normal diastolic function, trivial Daniel, moderate biatrial enlargement, trivial TR, RVSP 40 mmHg, dilated IVC.  Bilateral Carotid Dopplers  Findings suggest 1-39% internal carotid artery stenosis bilaterally. Vertebral arteries are patent with antegrade flow.   ASSESSMENT: Einer Meals is a 62 y.o. year old male here with left internal capsule and  right thalamus on 03/10/2018 secondary to small vessel disease. Vascular risk factors include HTN, HLD and smoking.     PLAN: -Continue clopidogrel 75 mg daily only and Lipitor for secondary stroke prevention -Stop aspirin as 3 weeks of DAPT completed -Start baclofen 5 mg twice a day for 1 week for muscle spasms  and if tolerating okay advised patient he can increase to 3 times daily -advised patient to stop taking Flexeril at this time -Schedule EMG/nerve conduction study for muscle spasms -F/u with PCP regarding your HLD and HTN management -continue to monitor BP at home -Maintain strict control of hypertension with blood pressure goal below 130/90, diabetes with hemoglobin A1c goal below 6.5% and cholesterol with LDL cholesterol (bad cholesterol) goal below 70 mg/dL. I also advised the patient to eat a healthy diet with plenty of whole grains, cereals, fruits and vegetables, exercise regularly and maintain ideal body weight.  Follow up in 2 months or call earlier if needed   Greater than 50% of time during this 25 minute visit was spent on counseling,explanation of diagnosis of left internal capsule and right thalamus, reviewing risk factor management of HTN and HLD, planning of further management, discussion with patient and family and coordination of care    Venancio Poisson, [facility]  [facility] [address] [place], [place] [address]  Phone [phone] Fax [phone]

## 2018-04-02 LAB — CULTURE, BLOOD (ROUTINE X 2)
Culture: NO GROWTH
Culture: NO GROWTH
Special Requests: ADEQUATE
Special Requests: ADEQUATE

## 2018-04-03 NOTE — Progress Notes (Signed)
I reviewed above note and agree with the assessment and plan.   Rosalin Hawking, MD PhD Stroke Neurology 04/03/2018 6:03 PM

## 2018-04-12 ENCOUNTER — Ambulatory Visit: Payer: 59 | Admitting: Family Medicine

## 2018-04-12 ENCOUNTER — Telehealth: Payer: Self-pay | Admitting: Adult Health

## 2018-04-12 ENCOUNTER — Encounter: Payer: Self-pay | Admitting: Family Medicine

## 2018-04-12 VITALS — BP 128/79 | HR 71 | Temp 98.5°F | Ht 70.5 in | Wt 168.0 lb

## 2018-04-12 DIAGNOSIS — E785 Hyperlipidemia, unspecified: Secondary | ICD-10-CM | POA: Diagnosis not present

## 2018-04-12 DIAGNOSIS — I10 Essential (primary) hypertension: Secondary | ICD-10-CM | POA: Diagnosis not present

## 2018-04-12 DIAGNOSIS — I701 Atherosclerosis of renal artery: Secondary | ICD-10-CM | POA: Diagnosis not present

## 2018-04-12 DIAGNOSIS — R030 Elevated blood-pressure reading, without diagnosis of hypertension: Secondary | ICD-10-CM | POA: Diagnosis not present

## 2018-04-12 DIAGNOSIS — I150 Renovascular hypertension: Secondary | ICD-10-CM | POA: Diagnosis not present

## 2018-04-12 MED ORDER — ATORVASTATIN CALCIUM 80 MG PO TABS: 80.0000 mg | ORAL_TABLET | Freq: Every day | ORAL | 1 refills | Status: DC

## 2018-04-12 MED ORDER — CLOPIDOGREL BISULFATE 75 MG PO TABS: 75.0000 mg | ORAL_TABLET | Freq: Every day | ORAL | 5 refills | Status: DC

## 2018-04-12 NOTE — Progress Notes (Signed)
Subjective:  Patient ID: Daniel [name], male    DOB: [birth year]  Age: 62 y.o. MRN: [id number]  CC: Medical Management of Chronic Issues   HPI Daniel [name] presents for recheck of blood pressure. Hoever, he had a stroke while hospitalized last month for a TIA. Beecause of this he underewent stenting of both renal arteries. This opened up the circulation so that his BP went down on its own and all of his BP Meds were DCed. He is currently wearing an ambulatory BP monitor. He was placed on plavix and atorvastatin after the stroke. Records of hospitalization reviewed, confirming stroke. However pt. Denies residual weakness.MRI confirms deficit.   Depression screen [facility] 2/9 04/12/2018 12/31/2017 12/03/2017  Decreased Interest 0 0 0  Down, Depressed, Hopeless 0 0 0  PHQ - 2 Score 0 0 0    History Daniel [name] has a past medical history of BPH (benign prostatic hyperplasia), COPD (chronic obstructive pulmonary disease) ([place]), Hyperlipidemia, Hypertension, Pulmonary nodule, renal stents, and Stroke ([place]).   He has a past surgical history that includes No past surgeries; renal stents; and Cervical fusion.   His family history includes Cancer in his father; Emphysema in his father; Lung cancer in his mother.He reports that he has quit smoking. His smoking use included cigarettes. He has a 42.00 pack-year smoking history. He has never used smokeless tobacco. He reports that he does not drink alcohol or use drugs.    ROS Review of Systems  Constitutional: Negative.   HENT: Negative.   Eyes: Negative for visual disturbance.  Respiratory: Negative for cough and shortness of breath.   Cardiovascular: Negative for chest pain and leg swelling.  Gastrointestinal: Negative for abdominal pain, diarrhea, nausea and vomiting.  Genitourinary: Negative for difficulty urinating.  Musculoskeletal: Negative for arthralgias and myalgias.  Skin: Negative for rash.  Neurological: Negative for headaches.    Psychiatric/Behavioral: Negative for sleep disturbance.    Objective:  BP 128/79   Pulse 71   Temp 98.5 F (36.9 C) (Oral)   Ht 5' 10.5" (1.791 m)   Wt 168 lb (76.2 kg)   BMI 23.76 kg/m   BP Readings from Last 3 Encounters:  04/12/18 128/79  03/30/18 102/60  03/29/18 (!) 109/58    Wt Readings from Last 3 Encounters:  04/12/18 168 lb (76.2 kg)  03/30/18 159 lb (72.1 kg)  03/28/18 157 lb 3 oz (71.3 kg)     Physical Exam  Constitutional: He is oriented to person, place, and time. He appears well-developed and well-nourished. No distress.  HENT:  Head: Normocephalic and atraumatic.  Right Ear: External ear normal.  Left Ear: External ear normal.  Nose: Nose normal.  Mouth/Throat: Oropharynx is clear and moist.  Eyes: Pupils are equal, round, and reactive to light. Conjunctivae and EOM are normal.  Neck: Normal range of motion. Neck supple. No thyromegaly present.  Cardiovascular: Normal rate, regular rhythm and normal heart sounds.  No murmur heard. Pulmonary/Chest: Effort normal and breath sounds normal. No respiratory distress. He has no wheezes. He has no rales.  Abdominal: Soft. Bowel sounds are normal. He exhibits no distension. There is no tenderness.  Lymphadenopathy:    He has no cervical adenopathy.  Neurological: He is alert and oriented to person, place, and time. He has normal reflexes.  Skin: Skin is warm and dry.  Psychiatric: He has a normal mood and affect. His behavior is normal. Judgment and thought content normal.      Assessment & Plan:   Daniel [name] was seen  today for medical management of chronic issues.  Diagnoses and all orders for this visit:  Renovascular hypertension  Essential hypertension -     CMP14+EGFR  Hyperlipidemia, unspecified hyperlipidemia type  Renal artery stenosis (HCC)  Other orders -     atorvastatin (LIPITOR) 80 MG tablet; Take 1 tablet (80 mg total) by mouth daily at 6 PM. -     clopidogrel (PLAVIX) 75 MG tablet;  Take 1 tablet (75 mg total) by mouth daily.   Pt had renal stents placed. Is following with nephrology.    I am having Daniel [name] maintain his albuterol, ADVAIR DISKUS, folic acid, thiamine, folic acid, docusate sodium, polyethylene glycol, cyclobenzaprine, acetaminophen, Baclofen, atorvastatin, and clopidogrel.  Allergies as of 04/12/2018   No Known Allergies     Medication List        Accurate as of 04/12/18  9:51 PM. Always use your most recent med list.          acetaminophen 500 MG tablet Commonly known as:  TYLENOL Take 500 mg by mouth every 6 (six) hours as needed for mild pain or moderate pain.   ADVAIR DISKUS 250-50 MCG/DOSE Aepb Generic drug:  Fluticasone-Salmeterol TAKE 1 PUFF BY MOUTH TWICE A DAY   albuterol 108 (90 Base) MCG/ACT inhaler Commonly known as:  PROVENTIL HFA;VENTOLIN HFA Inhale 2 puffs into the lungs every 6 (six) hours as needed for wheezing or shortness of breath.   atorvastatin 80 MG tablet Commonly known as:  LIPITOR Take 1 tablet (80 mg total) by mouth daily at 6 PM.   Baclofen 5 MG Tabs Take 5 mg by mouth 2 (two) times daily.   clopidogrel 75 MG tablet Commonly known as:  PLAVIX Take 1 tablet (75 mg total) by mouth daily.   cyclobenzaprine 5 MG tablet Commonly known as:  FLEXERIL Take 5 mg by mouth at bedtime as needed for muscle spasms.   docusate sodium 100 MG capsule Commonly known as:  COLACE Take 100 mg by mouth 2 (two) times daily.   folic acid 784 MCG tablet Commonly known as:  FOLVITE Take 400 mcg by mouth daily.   folic acid 1 MG tablet Commonly known as:  FOLVITE Take 1 tablet (1 mg total) by mouth daily.   polyethylene glycol packet Commonly known as:  MIRALAX / GLYCOLAX Take 17 g by mouth daily.   thiamine 100 MG tablet Take 1 tablet (100 mg total) by mouth daily.        Follow-up: Return in about 6 months (around 10/12/2018).  [name], M.D.

## 2018-04-12 NOTE — Telephone Encounter (Signed)
Pt wife(on DPR) has called to inform that she has checked with the pharmacy and they have yet to get the refill request for clopidogrel (PLAVIX) 75 MG tablet and atorvastatin (LIPITOR) 80 MG tablet Please send to  [place], [place] - [address] [place] [address] [phone] (Phone) [phone] (Fax)     Wife states pt is running very very low

## 2018-04-12 NOTE — Telephone Encounter (Signed)
Revised. 

## 2018-04-12 NOTE — Telephone Encounter (Addendum)
Rn call patients wife about lipitor, and plavix. Rn stated per Janett Billow NP its recommend Dr.Stacks start refilling the plavix,and lipitor routine medications. Rn stated per epic pt has an appt with Dr Livia Snellen at 0400pm his PCP this afternoon. The wife stated pt has one pill left. Rn stated all stroke patients have to follow up with PCP after being discharge so the PCP what knows whichmeds to continue to refill.The wife verbalized understanding.

## 2018-04-13 LAB — CMP14+EGFR
ALT: 27 IU/L (ref 0–44)
AST: 20 IU/L (ref 0–40)
Albumin/Globulin Ratio: 1.9 (ref 1.2–2.2)
Albumin: 4.3 g/dL (ref 3.6–4.8)
Alkaline Phosphatase: 83 IU/L (ref 39–117)
BUN/Creatinine Ratio: 13 (ref 10–24)
BUN: 13 mg/dL (ref 8–27)
Bilirubin Total: 0.8 mg/dL (ref 0.0–1.2)
CO2: 23 mmol/L (ref 20–29)
Calcium: 9.1 mg/dL (ref 8.6–10.2)
Chloride: 104 mmol/L (ref 96–106)
Creatinine, Ser: 1.04 mg/dL (ref 0.76–1.27)
GFR calc Af Amer: 89 mL/min/{1.73_m2} (ref >59)
GFR calc non Af Amer: 77 mL/min/{1.73_m2} (ref >59)
Globulin, Total: 2.3 g/dL (ref 1.5–4.5)
Glucose: 82 mg/dL (ref 65–99)
Potassium: 4.2 mmol/L (ref 3.5–5.2)
Sodium: 143 mmol/L (ref 134–144)
Total Protein: 6.6 g/dL (ref 6.0–8.5)

## 2018-04-27 DIAGNOSIS — Z7902 Long term (current) use of antithrombotics/antiplatelets: Secondary | ICD-10-CM | POA: Diagnosis not present

## 2018-04-27 DIAGNOSIS — I701 Atherosclerosis of renal artery: Secondary | ICD-10-CM | POA: Diagnosis not present

## 2018-04-27 DIAGNOSIS — I150 Renovascular hypertension: Secondary | ICD-10-CM | POA: Diagnosis not present

## 2018-04-29 ENCOUNTER — Other Ambulatory Visit: Payer: Self-pay | Admitting: Family Medicine

## 2018-04-29 DIAGNOSIS — I150 Renovascular hypertension: Secondary | ICD-10-CM | POA: Diagnosis not present

## 2018-04-29 DIAGNOSIS — Z95828 Presence of other vascular implants and grafts: Secondary | ICD-10-CM | POA: Diagnosis not present

## 2018-05-24 ENCOUNTER — Encounter: Payer: Self-pay | Admitting: Neurology

## 2018-05-24 ENCOUNTER — Ambulatory Visit (INDEPENDENT_AMBULATORY_CARE_PROVIDER_SITE_OTHER): Payer: 59 | Admitting: Neurology

## 2018-05-24 ENCOUNTER — Ambulatory Visit: Payer: 59 | Admitting: Neurology

## 2018-05-24 DIAGNOSIS — R531 Weakness: Secondary | ICD-10-CM

## 2018-05-24 DIAGNOSIS — M62838 Other muscle spasm: Secondary | ICD-10-CM | POA: Diagnosis not present

## 2018-05-24 NOTE — Procedures (Signed)
     HISTORY:  Daniel [name] is a 62 year old gentleman with a 5-year history of intermittent cramps of the feet and hands.  The patient has gotten some benefit with use of baclofen.  He is being evaluated for this issue.  NERVE CONDUCTION STUDIES:  Nerve conduction studies were performed on the right upper extremity. The distal motor latencies and motor amplitudes for the median and ulnar nerves were within normal limits. The nerve conduction velocities for these nerves were also normal. The sensory latencies for the median and ulnar nerves were normal. The F wave latency for the ulnar nerve was within normal limits.  Nerve conduction studies were performed on both lower extremities. The distal motor latencies and motor amplitudes for the peroneal and posterior tibial nerves were within normal limits. The nerve conduction velocities for these nerves were also normal. The sensory latencies for the peroneal and sural nerves were within normal limits, with exception that the sensory latency for the right peroneal nerve was minimally prolonged.  The amplitudes for the sensory responses were low bilaterally for the peroneal and sural nerves.  The F wave latencies for the posterior tibial nerves were within normal limits.   EMG STUDIES:  EMG study was performed on the right lower extremity:  The tibialis anterior muscle reveals 2 to 4K motor units with full recruitment. No fibrillations or positive waves were seen. The peroneus tertius muscle reveals 2 to 4K motor units with full recruitment. No fibrillations or positive waves were seen. The medial gastrocnemius muscle reveals 1 to 3K motor units with full recruitment. No fibrillations or positive waves were seen. The vastus lateralis muscle reveals 2 to 4K motor units with full recruitment. No fibrillations or positive waves were seen. The iliopsoas muscle reveals 2 to 4K motor units with full recruitment. No fibrillations or positive waves  were seen. The biceps femoris muscle (long head) reveals 2 to 4K motor units with full recruitment. No fibrillations or positive waves were seen. The lumbosacral paraspinal muscles were tested at 3 levels, and revealed no abnormalities of insertional activity at all 3 levels tested. There was good relaxation.   IMPRESSION:  Nerve conduction studies done on the right upper extremity and both lower extremities were relatively unremarkable with exception of mild prolongation of the right peroneal sensory latency.  Some mild lowering of sensory amplitudes were seen for the lower extremities bilaterally.  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  Clinical correlation is required.  EMG evaluation of the right lower extremity was unremarkable without evidence of an overlying lumbosacral radiculopathy.  Jill Alexanders MD 05/24/2018 2:03 PM  [facility] [address] [place], [place] [address]  Phone [phone] Fax [phone]

## 2018-05-24 NOTE — Progress Notes (Signed)
[place]    Nerve / Sites Muscle Latency Ref. Amplitude Ref. Rel Amp Segments Distance Velocity Ref. Area    ms ms mV mV %  cm m/s m/s mVms  R Median - APB     Wrist APB 4.3 ?4.4 4.0 ?4.0 100 Wrist - APB 7   12.7     Upper arm APB 8.9  3.4  84.4 Upper arm - Wrist 23 50 ?49 12.4  R Ulnar - ADM     Wrist ADM 2.8 ?3.3 6.8 ?6.0 100 Wrist - ADM 7   17.5     B.Elbow ADM 6.9  5.7  83.2 B.Elbow - Wrist 22 54 ?49 17.5     A.Elbow ADM 8.7  4.9  86 A.Elbow - B.Elbow 10 55 ?49 16.9         A.Elbow - Wrist      R Peroneal - EDB     Ankle EDB 4.7 ?6.5 7.6 ?2.0 100 Ankle - EDB 9   25.8     Fib head EDB 12.3  6.9  90.1 Fib head - Ankle 34 45 ?44 25.7     Pop fossa EDB 14.6  6.7  98.4 Pop fossa - Fib head 10 44 ?44 25.9         Pop fossa - Ankle      L Peroneal - EDB     Ankle EDB 4.7 ?6.5 4.2 ?2.0 100 Ankle - EDB 9   13.0     Fib head EDB 12.2  3.2  76.4 Fib head - Ankle 34 45 ?44 11.5     Pop fossa EDB 14.5  3.7  115 Pop fossa - Fib head 10 45 ?44 14.7         Pop fossa - Ankle      R Tibial - AH     Ankle AH 5.2 ?5.8 20.0 ?4.0 100 Ankle - AH 9   42.0     Pop fossa AH 14.6  14.0  69.9 Pop fossa - Ankle 39 41 ?41 39.6  L Tibial - AH     Ankle AH 5.3 ?5.8 15.6 ?4.0 100 Ankle - AH 9   32.1     Pop fossa AH 14.9  10.4  66.6 Pop fossa - Ankle 39 41 ?41 26.8                 SNC    Nerve / Sites Rec. Site Peak Lat Ref.  Amp Ref. Segments Distance    ms ms V V  cm  R Sural - Ankle (Calf)     Calf Ankle 4.4 ?4.4 4 ?6 Calf - Ankle 14  L Sural - Ankle (Calf)     Calf Ankle 4.2 ?4.4 3 ?6 Calf - Ankle 14  R Superficial peroneal - Ankle     Lat leg Ankle 4.5 ?4.4 4 ?6 Lat leg - Ankle 14  L Superficial peroneal - Ankle     Lat leg Ankle 4.2 ?4.4 3 ?6 Lat leg - Ankle 14  R Median - Orthodromic (Dig II, Mid palm)     Dig II Wrist 3.2 ?3.4 10 ?10 Dig II - Wrist 13  R Ulnar - Orthodromic, (Dig V, Mid palm)     Dig V Wrist 2.8 ?3.1 13 ?5 Dig V - Wrist 53                  F  Wave    Nerve F Lat Ref.  ms  ms  R Tibial - AH 54.6 ?56.0  L Tibial - AH 53.6 ?56.0  R Ulnar - ADM 28.7 ?32.0

## 2018-05-24 NOTE — Progress Notes (Signed)
Please refer to EMG and NCV procedure note. 

## 2018-05-26 ENCOUNTER — Telehealth: Payer: Self-pay

## 2018-05-26 NOTE — Telephone Encounter (Signed)
Notes recorded by Marval Regal, RN on 05/26/2018 at 1:10 PM EDT Rn call patient that his nerve conduction test showed mild neuropathy in lower extremities. But otherwise unremarkable. Rn stated he can discuss any concerns on 06/01/2018 when he sees [facility]. PT verbalized understanding.

## 2018-05-26 NOTE — Telephone Encounter (Signed)
-----   Message from Daniel Poisson, NP sent at 05/25/2018  7:21 AM EDT ----- Please notify patient that his nerve conduction did show some mild neuropathy in his lower extremities but otherwise unremarkable. Thank you.

## 2018-05-27 ENCOUNTER — Other Ambulatory Visit: Payer: Self-pay | Admitting: Adult Health

## 2018-05-27 DIAGNOSIS — M62838 Other muscle spasm: Secondary | ICD-10-CM

## 2018-05-30 ENCOUNTER — Other Ambulatory Visit: Payer: Self-pay

## 2018-05-30 DIAGNOSIS — M62838 Other muscle spasm: Secondary | ICD-10-CM

## 2018-05-30 MED ORDER — BACLOFEN 5 MG PO TABS: 5.0000 mg | ORAL_TABLET | Freq: Two times a day (BID) | ORAL | 6 refills | Status: DC

## 2018-06-01 ENCOUNTER — Ambulatory Visit: Payer: 59 | Admitting: Adult Health

## 2018-06-01 ENCOUNTER — Encounter: Payer: Self-pay | Admitting: Adult Health

## 2018-06-01 VITALS — BP 133/74 | HR 86 | Ht 70.5 in | Wt 174.4 lb

## 2018-06-01 DIAGNOSIS — M62838 Other muscle spasm: Secondary | ICD-10-CM

## 2018-06-01 DIAGNOSIS — E785 Hyperlipidemia, unspecified: Secondary | ICD-10-CM

## 2018-06-01 DIAGNOSIS — I639 Cerebral infarction, unspecified: Secondary | ICD-10-CM

## 2018-06-01 DIAGNOSIS — I10 Essential (primary) hypertension: Secondary | ICD-10-CM | POA: Diagnosis not present

## 2018-06-01 MED ORDER — BACLOFEN 5 MG PO TABS
5.0000 mg | ORAL_TABLET | Freq: Three times a day (TID) | ORAL | 4 refills | Status: DC
Start: 2018-06-01 — End: 2019-03-28

## 2018-06-01 NOTE — Progress Notes (Signed)
[facility] [address] [place]. [place] [address] [phone]       OFFICE FOLLOW UP NOTE  [name] Date of Birth:  April 24, 1956 Medical Record Number:  [id number]   Reason for Referral:  hospital stroke follow up  CHIEF COMPLAINT:  Chief Complaint  Patient presents with  . 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 History obtained from patient, wife and chart review. Reviewed all radiology images and labs personally.  Mr. [name] is a 62 y.o. male with history of pulmonary nodule, ongoing tobacco use, ETOH use, hypertension, hyperlipidemia, COPD, CKD with likely renal artery stenosis presenting with intermittent right-sided weakness. He did not receive IV t-PA due to resolution of deficits and late presentation. However, at lunch time, his right sided weakness returned. CT head reviewed and showed chronic stable mild to moderate small vessel ischemic disease with no significant change from prior imaging.  Initial MRI showed no acute intercranial abnormality.  Repeat MRI did show acute/early subacute infarctions within the left posterior limb of internal capsule and right posteromedial thalamus.  Carotid Dopplers are unremarkable.  2D echo showed an EF of 60 to 65%.  Patient was not on antithrombotic prior to admission but due to concerns of capsular warning syndrome, he was loaded with aspirin and Plavix along with Lipitor 80 mg and recommended to continue DAPT for 3 weeks and then Plavix alone.  LDL 134 and recommended to continue Lipitor 80 mg at discharge.  Therapies recommended CIR placement.   Patient is being seen today for hospital follow-up visit and is accompanied by his wife.  All symptoms have resolved and he is back to doing all normal activities.  He does complain of daily headaches  since his stroke.  Denies phonophobia or nausea vomiting but does endorse photophobia.  He has been taking extra strength Tylenol which helps relieve these headaches.  They are not debilitating as he can continue to do certain activities but also states he has not been as active due to recent renal stent placement.  Patient also complains of muscle cramps that he states is been occurring for the past 5 years where his muscles will contract and become very painfu.  This typically happens when patient has increased fatigue or with increased activity.  They are also worse in the evening or with cold temperature.  He has recently been taking Flexeril without much relief.  Patient states he has had multiple blood tests in the past by primary providers but all negative.  Continues to take both aspirin and Plavix without side effects of bleeding or bruising.  Continues to take Lipitor without increased side effects of myalgias.  Blood pressure today at beginning of visit 153/93 and at recheck at end of visit 102/60.  States he has not smoked cigarettes since hospital discharge.  Denies new or worsening stroke/TIA symptoms.  06/01/18 UPDATE: Patient is being seen today for routine follow-up appointment.  Patient did undergo EMG/nerve conduction study but only showed very mild bilateral lower extremity neuropathy.  Patient continues to take baclofen twice daily which has greatly reduced his muscle spasms.  He states on occasion he may have increase spasms during the day in between doses but overall feels as though these have greatly improved.  He has continued to tolerate baclofen well without side effects.  Does have complaints of continued dizziness for which she has had for the past 5 years that occurs with rapid movement or position changes and lasts only seconds.  Patient was provided with BPPV home exercises as he is declining PT at this time.  Continues to take Plavix without side effects of bleeding or bruising.   Continues to take Lipitor without side effects myalgias.  Blood pressure today satisfactory 133/74.  Denies new or worsening stroke/TIA symptoms.   ROS:   14 system review of systems performed and negative with exception of no complaints  PMH:  Past Medical History:  Diagnosis Date  . BPH (benign prostatic hyperplasia)   . COPD (chronic obstructive pulmonary disease) ([place])   . Hyperlipidemia   . Hypertension   . Pulmonary nodule   . renal stents   . Stroke [facility])     PSH:  Past Surgical History:  Procedure Laterality Date  . CERVICAL FUSION    . NO PAST SURGERIES    . renal stents      Social History:  Social History   Socioeconomic History  . Marital status: Married    Spouse name: Not on file  . Number of children: Not on file  . Years of education: Not on file  . Highest education level: Not on file  Occupational History  . Occupation: Surveyor, mining  . Financial resource strain: Not on file  . Food insecurity:    Worry: Not on file    Inability: Not on file  . Transportation needs:    Medical: Not on file    Non-medical: Not on file  Tobacco Use  . Smoking status: Former Smoker    Packs/day: 1.00    Years: 42.00    Pack years: 42.00    Types: Cigarettes  . Smokeless tobacco: Never Used  Substance and Sexual Activity  . Alcohol use: Never    Frequency: Never    Comment: not dirnking now  . Drug use: Never  . Sexual activity: Not on file  Lifestyle  . Physical activity:    Days per week: Not on file    Minutes per session: Not on file  . Stress: Not on file  Relationships  . Social connections:    Talks on phone: Not on file    Gets together: Not on file    Attends religious service: Not on file    Active member of club or organization: Not on file    Attends meetings of clubs or organizations: Not on file    Relationship status: Not on file  . Intimate partner violence:    Fear of current or ex partner: Not on file    Emotionally  abused: Not on file    Physically abused: Not on file    Forced sexual activity: Not on file  Other Topics Concern  . Not on file  Social History Narrative  . Not on file    Family History:  Family History  Problem Relation Age of Onset  . Emphysema Father   . Cancer Father   . Lung cancer Mother     Medications:   Current Outpatient Medications on File Prior to Visit  Medication Sig Dispense Refill  . acetaminophen (TYLENOL) 500 MG tablet Take 500 mg by mouth every 6 (six) hours as needed for mild pain or moderate pain.    Marland Kitchen ADVAIR DISKUS 250-50 MCG/DOSE AEPB TAKE 1 PUFF BY MOUTH TWICE A DAY 60 each 5  . atorvastatin (  LIPITOR) 80 MG tablet Take 1 tablet (80 mg total) by mouth daily at 6 PM. 90 tablet 1  . baclofen (LIORESAL) 10 MG tablet Take by mouth.    . clopidogrel (PLAVIX) 75 MG tablet Take 1 tablet (75 mg total) by mouth daily. 30 tablet 5  . cyclobenzaprine (FLEXERIL) 5 MG tablet Take 5 mg by mouth at bedtime as needed for muscle spasms.     Marland Kitchen docusate sodium (COLACE) 100 MG capsule Take 100 mg by mouth 2 (two) times daily.    . Fluticasone-Salmeterol (ADVAIR) 250-50 MCG/DOSE AEPB Inhale into the lungs.    . folic acid (FOLVITE) 1 MG tablet Take 1 tablet (1 mg total) by mouth daily. 30 tablet 0  . thiamine 100 MG tablet Take 1 tablet (100 mg total) by mouth daily. 30 tablet 0  . albuterol (PROVENTIL HFA) 108 (90 Base) MCG/ACT inhaler Inhale into the lungs.     No current facility-administered medications on file prior to visit.     Allergies:  No Known Allergies   Physical Exam  Vitals:   06/01/18 1453  BP: 133/74  Pulse: 86  Weight: 174 lb 6.4 oz (79.1 kg)  Height: 5' 10.5" (1.791 m)   Body mass index is 24.67 kg/m. No exam data present  General: Frail middle-aged Caucasian male, seated, in no evident distress Head: head normocephalic and atraumatic.   Neck: supple with no carotid or supraclavicular bruits Cardiovascular: regular rate and rhythm, no  murmurs Musculoskeletal: no deformity Skin:  no rash/petichiae Vascular:  Normal pulses all extremities  Neurologic Exam Mental Status: Awake and fully alert. Oriented to place and time. Recent and remote memory intact. Attention span, concentration and fund of knowledge appropriate. Mood and affect appropriate.  Cranial Nerves: Fundoscopic exam reveals sharp disc margins. Pupils equal, briskly reactive to light. Extraocular movements full without nystagmus. Visual fields full to confrontation. Hearing intact. Facial sensation intact. Face, tongue, palate moves normally and symmetrically.  Motor: Normal bulk and tone. Normal strength in all tested extremity muscles. Sensory.: intact to touch , pinprick , position and vibratory sensation.  Coordination: Rapid alternating movements normal in all extremities. Finger-to-nose and heel-to-shin performed accurately bilaterally. Gait and Station: Arises from chair without difficulty. Stance is normal. Gait demonstrates normal stride length and balance . Able to heel, toe and tandem walk without difficulty.  Reflexes: 1+ and symmetric. Toes downgoing.       Diagnostic Data (Labs, Imaging, Testing)  Ct Head Wo Contrast 03/10/2018 IMPRESSION:  Chronic stable mild-to-moderate small vessel ischemic disease. No significant change from prior. No acute intracranial abnormality is identified.   MRI HEAD 03/11/2018 1. No acute intracranial abnormality.  2. Mild to moderate chronic small vessel ischemic change.   MRI HEAD (REPEAT) 03/12/2018 IMPRESSION: 1. 6 mm acute/early subacute infarctions within left posterior limb of internal capsule and right posteromedial thalamus. No hemorrhage or mass effect. 2. Stable mild-to-moderate chronic microvascular ischemic changes and parenchymal volume loss of the brain.  MRA HEAD IMPRESSION  1. Negative intracranial MRA for large vessel occlusion.  2. Mild atherosclerotic change for age without  hemodynamically significant or correctable stenosis.   Transthoracic Echocardiogram  - LVEF 60-65%, normal wall thickness, normal wall motion, normal diastolic function, trivial MR, moderate biatrial enlargement, trivial TR, RVSP 40 mmHg, dilated IVC.  Bilateral Carotid Dopplers  Findings suggest 1-39% internal carotid artery stenosis bilaterally. Vertebral arteries are patent with antegrade flow.   ASSESSMENT: Lavonte Palos is a 62 y.o. year old male here with left  internal capsule and right thalamus on 03/10/2018 secondary to small vessel disease. Vascular risk factors include HTN, HLD and smoking.  Patient returns today for routine follow-up and overall doing well.    PLAN: -Continue clopidogrel 75 mg daily and Lipitor for secondary stroke prevention -Continue baclofen 5 mg 3 times a day for muscle spasms -Advised to continue to stay active and maintain a healthy diet -F/u with PCP regarding your HLD and HTN management -Consider PT for possible BPPV -continue to monitor BP at home -Maintain strict control of hypertension with blood pressure goal below 130/90, diabetes with hemoglobin A1c goal below 6.5% and cholesterol with LDL cholesterol (bad cholesterol) goal below 70 mg/dL. I also advised the patient to eat a healthy diet with plenty of whole grains, cereals, fruits and vegetables, exercise regularly and maintain ideal body weight.   Follow up in 6 months or call earlier if needed   Greater than 50% of time during this 25 minute visit was spent on counseling,explanation of diagnosis of left internal capsule and right thalamus, reviewing risk factor management of HTN and HLD, planning of further management, discussion with patient and family and coordination of care    Venancio Poisson, [facility]  [facility] [address] [place], [place] [address]  Phone [phone] Fax [phone]

## 2018-06-01 NOTE — Patient Instructions (Addendum)
Continue clopidogrel 75 mg daily  and lipitor  for secondary stroke prevention  Continue to follow up with PCP regarding cholesterol and blood pressure management   Continue baclofen 5mg  three times daily for muscle spasms   Consider therapy for benign paroxysmal positional vertigo (BPPV)   Continue to monitor blood pressure at home  Maintain strict control of hypertension with blood pressure goal below 130/90, diabetes with hemoglobin A1c goal below 6.5% and cholesterol with LDL cholesterol (bad cholesterol) goal below 70 mg/dL. I also advised the patient to eat a healthy diet with plenty of whole grains, cereals, fruits and vegetables, exercise regularly and maintain ideal body weight.  Followup in the future with me in 6 months or call earlier if needed        Thank you for coming to see [place] at [facility]. I hope we have been able to provide you high quality care today.  You may receive a patient satisfaction survey over the next few weeks. We would appreciate your feedback and comments so that we may continue to improve ourselves and the health of our patients.

## 2018-06-16 NOTE — Progress Notes (Signed)
I agree with the above plan 

## 2018-07-07 ENCOUNTER — Other Ambulatory Visit: Payer: Self-pay | Admitting: *Deleted

## 2018-07-07 ENCOUNTER — Ambulatory Visit (INDEPENDENT_AMBULATORY_CARE_PROVIDER_SITE_OTHER): Payer: 59 | Admitting: *Deleted

## 2018-07-07 DIAGNOSIS — R21 Rash and other nonspecific skin eruption: Secondary | ICD-10-CM

## 2018-07-07 MED ORDER — METHYLPREDNISOLONE ACETATE 80 MG/ML IJ SUSP
60.0000 mg | Freq: Once | INTRAMUSCULAR | Status: AC
  Administered 2018-07-07: 60 mg via INTRAMUSCULAR

## 2018-07-07 MED ORDER — PREDNISONE 10 MG PO TABS: ORAL_TABLET | ORAL | 0 refills | Status: DC

## 2018-07-07 NOTE — Progress Notes (Signed)
Pt given Methylprednisolone inj Tolerated well

## 2018-07-07 NOTE — Progress Notes (Signed)
Pt called Dr Laurance Flatten at home and sent a pic of a rash that he has. Dr Laurance Flatten would like Prednisone ordered (done) and he would like pt to have a nurse visit for a depo medrol 60 mg shot. I will call pt and arrange this for today.

## 2018-08-09 ENCOUNTER — Ambulatory Visit: Payer: 59 | Admitting: Family Medicine

## 2018-08-09 ENCOUNTER — Encounter: Payer: Self-pay | Admitting: Family Medicine

## 2018-08-09 VITALS — BP 123/86 | HR 75 | Temp 99.1°F | Ht 70.5 in | Wt 176.0 lb

## 2018-08-09 DIAGNOSIS — R399 Unspecified symptoms and signs involving the genitourinary system: Secondary | ICD-10-CM

## 2018-08-09 DIAGNOSIS — R51 Headache: Secondary | ICD-10-CM

## 2018-08-09 DIAGNOSIS — Z23 Encounter for immunization: Secondary | ICD-10-CM

## 2018-08-09 DIAGNOSIS — G4769 Other sleep related movement disorders: Secondary | ICD-10-CM | POA: Diagnosis not present

## 2018-08-09 DIAGNOSIS — R519 Headache, unspecified: Secondary | ICD-10-CM

## 2018-08-09 LAB — URINALYSIS, COMPLETE
Bilirubin, UA: NEGATIVE
Glucose, UA: NEGATIVE
Ketones, UA: NEGATIVE
Leukocytes, UA: NEGATIVE
Nitrite, UA: NEGATIVE
RBC, UA: NEGATIVE
Specific Gravity, UA: 1.015 (ref 1.005–1.030)
Urobilinogen, Ur: 0.2 mg/dL (ref 0.2–1.0)
pH, UA: 6.5 (ref 5.0–7.5)

## 2018-08-09 LAB — MICROSCOPIC EXAMINATION
Bacteria, UA: NONE SEEN
Epithelial Cells (non renal): NONE SEEN /hpf (ref 0–10)
RBC, UA: NONE SEEN /hpf (ref 0–2)
Renal Epithel, UA: NONE SEEN /hpf
WBC, UA: NONE SEEN /hpf (ref 0–5)

## 2018-08-09 MED ORDER — PRAMIPEXOLE DIHYDROCHLORIDE 0.125 MG PO TABS: 0.1250 mg | ORAL_TABLET | Freq: Every day | ORAL | 1 refills | Status: DC

## 2018-08-09 NOTE — Progress Notes (Signed)
Subjective:  Patient ID: Daniel [name], male    DOB: Mar 08, 1956  Age: 62 y.o. MRN: [id number]  CC: Hypertension; Headache; and Urine odor   HPI Cyris Maalouf presents for recurrent cramping.  The cramping is primarily felt in the hands and feet.  It recurred about 3 weeks ago.  Until then it has been well controlled by the baclofen 5 mg 3 times daily with an occasional cyclobenzaprine.  The pain can be quite severe at times it tends to rollover the timeframe that it lasts.  This occurs daily up to 3 hours/day.  He gets partial relief by adding an over-the-counter medicine and if that does not help he will use Flexeril.  He has no fever chills or sweats.  Patient also complains that his urine stinks.  He is having some bilateral lower abdominal twinging at the area where the stents were placed by urology.  Depression screen [facility] 2/9 08/09/2018 04/12/2018 12/31/2017  Decreased Interest 0 0 0  Down, Depressed, Hopeless 0 0 0  PHQ - 2 Score 0 0 0    History Sherrill has a past medical history of BPH (benign prostatic hyperplasia), COPD (chronic obstructive pulmonary disease) ([place]), Hyperlipidemia, Hypertension, Pulmonary nodule, renal stents, and Stroke ([place]).   He has a past surgical history that includes No past surgeries; renal stents; and Cervical fusion.   His family history includes Cancer in his father; Emphysema in his father; Lung cancer in his mother.He reports that he has quit smoking. His smoking use included cigarettes. He has a 42.00 pack-year smoking history. He has never used smokeless tobacco. He reports that he does not drink alcohol or use drugs.    ROS Review of Systems  Constitutional: Negative.   HENT: Negative.   Eyes: Negative for visual disturbance.  Respiratory: Negative for cough and shortness of breath.   Cardiovascular: Negative for chest pain and leg swelling.  Gastrointestinal: Negative for abdominal pain, diarrhea, nausea and vomiting.  Genitourinary:  Negative for difficulty urinating.  Musculoskeletal: Negative for arthralgias and myalgias.  Skin: Negative for rash.  Neurological: Positive for headaches.  Psychiatric/Behavioral: Negative for sleep disturbance.    Objective:  BP 123/86   Pulse 75   Temp 99.1 F (37.3 C) (Oral)   Ht 5' 10.5" (1.791 m)   Wt 176 lb (79.8 kg)   BMI 24.90 kg/m   BP Readings from Last 3 Encounters:  08/09/18 123/86  06/01/18 133/74  04/12/18 128/79    Wt Readings from Last 3 Encounters:  08/09/18 176 lb (79.8 kg)  06/01/18 174 lb 6.4 oz (79.1 kg)  04/12/18 168 lb (76.2 kg)     Physical Exam  Constitutional: He is oriented to person, place, and time. He appears well-developed and well-nourished. No distress.  HENT:  Head: Normocephalic and atraumatic.  Right Ear: External ear normal.  Left Ear: External ear normal.  Nose: Nose normal.  Mouth/Throat: Oropharynx is clear and moist.  Eyes: Pupils are equal, round, and reactive to light. Conjunctivae and EOM are normal.  Neck: Normal range of motion. Neck supple.  Cardiovascular: Normal rate, regular rhythm and normal heart sounds.  No murmur heard. Pulmonary/Chest: Effort normal and breath sounds normal. No respiratory distress. He has no wheezes. He has no rales.  Abdominal: Soft. There is no tenderness.  Musculoskeletal: Normal range of motion.  Neurological: He is alert and oriented to person, place, and time. He has normal reflexes.  Skin: Skin is warm and dry.  Psychiatric: He has a normal mood  and affect. His behavior is normal. Judgment and thought content normal.      Assessment & Plan:   Daquon was seen today for hypertension, headache and urine odor.  Diagnoses and all orders for this visit:  Nocturnal myoclonus  UTI symptoms -     Urinalysis, Complete -     Urine Culture; Future -     Urine Culture  Need for immunization against influenza -     Flu Vaccine QUAD 36+ mos IM  Nonintractable headache, unspecified  chronicity pattern, unspecified headache type  Other orders -     pramipexole (MIRAPEX) 0.125 MG tablet; Take 1 tablet (0.125 mg total) by mouth at bedtime.       I have discontinued Gwyndolyn Saxon Crookshanks's predniSONE. I am also having him start on pramipexole. Additionally, I am having him maintain his ADVAIR DISKUS, folic acid, thiamine, docusate sodium, cyclobenzaprine, acetaminophen, atorvastatin, clopidogrel, albuterol, Fluticasone-Salmeterol, baclofen, and Baclofen.  Allergies as of 08/09/2018   No Known Allergies     Medication List        Accurate as of 08/09/18 10:10 AM. Always use your most recent med list.          acetaminophen 500 MG tablet Commonly known as:  TYLENOL Take 500 mg by mouth every 6 (six) hours as needed for mild pain or moderate pain.   Fluticasone-Salmeterol 250-50 MCG/DOSE Aepb Commonly known as:  ADVAIR Inhale into the lungs.   ADVAIR DISKUS 250-50 MCG/DOSE Aepb Generic drug:  Fluticasone-Salmeterol TAKE 1 PUFF BY MOUTH TWICE A DAY   atorvastatin 80 MG tablet Commonly known as:  LIPITOR Take 1 tablet (80 mg total) by mouth daily at 6 PM.   baclofen 10 MG tablet Commonly known as:  LIORESAL Take by mouth.   Baclofen 5 MG Tabs Take 5 mg by mouth 3 (three) times daily.   clopidogrel 75 MG tablet Commonly known as:  PLAVIX Take 1 tablet (75 mg total) by mouth daily.   cyclobenzaprine 5 MG tablet Commonly known as:  FLEXERIL Take 5 mg by mouth at bedtime as needed for muscle spasms.   docusate sodium 100 MG capsule Commonly known as:  COLACE Take 100 mg by mouth 2 (two) times daily.   folic acid 1 MG tablet Commonly known as:  FOLVITE Take 1 tablet (1 mg total) by mouth daily.   pramipexole 0.125 MG tablet Commonly known as:  MIRAPEX Take 1 tablet (0.125 mg total) by mouth at bedtime.   PROVENTIL HFA 108 (90 Base) MCG/ACT inhaler Generic drug:  albuterol Inhale into the lungs.   thiamine 100 MG tablet Take 1 tablet (100 mg  total) by mouth daily.        Follow-up: Return in about 1 month (around 09/09/2018).  [name], M.D.

## 2018-08-10 LAB — URINE CULTURE: Organism ID, Bacteria: NO GROWTH

## 2018-09-09 ENCOUNTER — Encounter: Payer: Self-pay | Admitting: Family Medicine

## 2018-09-09 ENCOUNTER — Ambulatory Visit: Payer: 59 | Admitting: Family Medicine

## 2018-09-09 VITALS — BP 125/72 | HR 69 | Temp 98.4°F | Ht 70.5 in | Wt 183.0 lb

## 2018-09-09 DIAGNOSIS — E785 Hyperlipidemia, unspecified: Secondary | ICD-10-CM | POA: Diagnosis not present

## 2018-09-09 DIAGNOSIS — G2581 Restless legs syndrome: Secondary | ICD-10-CM

## 2018-09-09 DIAGNOSIS — I10 Essential (primary) hypertension: Secondary | ICD-10-CM

## 2018-09-09 MED ORDER — FLUTICASONE-SALMETEROL 250-50 MCG/DOSE IN AEPB: INHALATION_SPRAY | RESPIRATORY_TRACT | 5 refills | Status: DC

## 2018-09-09 MED ORDER — CLOPIDOGREL BISULFATE 75 MG PO TABS: 75.0000 mg | ORAL_TABLET | Freq: Every day | ORAL | 1 refills | Status: DC

## 2018-09-09 MED ORDER — ATORVASTATIN CALCIUM 80 MG PO TABS: 80.0000 mg | ORAL_TABLET | Freq: Every day | ORAL | 1 refills | Status: DC

## 2018-09-09 NOTE — Progress Notes (Signed)
Subjective:  Patient ID: Daniel [name], male    DOB: 05-13-1956  Age: 62 y.o. MRN: [id number]  CC: Follow-up   HPI Daniel [name] presents for follow-up of restless leg syndrome.  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  Depression screen [facility] 2/9 09/09/2018 08/09/2018 04/12/2018  Decreased Interest 1 0 0  Down, Depressed, Hopeless 0 0 0  PHQ - 2 Score 1 0 0    History Daniel [name] has a past medical history of BPH (benign prostatic hyperplasia), COPD (chronic obstructive pulmonary disease) ([place]), Hyperlipidemia, Hypertension, Pulmonary nodule, renal stents, and Stroke ([place]).   He has a past surgical history that includes No past surgeries; renal stents; and Cervical fusion.   His family history includes Cancer in his father; Emphysema in his father; Lung cancer in his mother.He reports that he has quit smoking. His smoking use included cigarettes. He has a 42.00 pack-year smoking history. He has never used smokeless tobacco. He reports that he does not drink alcohol or use drugs.    ROS Review of Systems Noncontributory Objective:  BP 125/72   Pulse 69   Temp 98.4 F (36.9 C)   Ht 5' 10.5" (1.791 m)   Wt 183 lb (83 kg)   BMI 25.89 kg/m   BP Readings from Last 3 Encounters:  09/09/18 125/72  08/09/18 123/86  06/01/18 133/74    Wt Readings from Last 3 Encounters:  09/09/18 183 lb (83 kg)  08/09/18 176 lb (79.8 kg)  06/01/18 174 lb 6.4 oz (79.1 kg)     Physical Exam  Constitutional: He is oriented to person, place, and time. He appears well-developed and well-nourished.  HENT:  Head: Normocephalic and atraumatic.  Right Ear: External ear normal.  Left Ear: External ear normal.  Mouth/Throat: No oropharyngeal exudate or posterior oropharyngeal erythema.  Eyes: Pupils are equal, round, and reactive to light.  Neck: Normal range of motion. Neck supple.  Cardiovascular: Normal rate and regular rhythm.  No murmur  heard. Pulmonary/Chest: Breath sounds normal. No respiratory distress.  Neurological: He is alert and oriented to person, place, and time.  Vitals reviewed.     Assessment & Plan:   Daniel [name] was seen today for follow-up.  Diagnoses and all orders for this visit:  Essential hypertension -     CBC with Differential -     CMP14+EGFR -     Lipid panel  Hyperlipidemia, unspecified hyperlipidemia type -     CBC with Differential -     CMP14+EGFR -     Lipid panel  Restless leg syndrome  Other orders -     Fluticasone-Salmeterol (ADVAIR DISKUS) 250-50 MCG/DOSE AEPB; TAKE 1 PUFF BY MOUTH TWICE A DAY -     atorvastatin (LIPITOR) 80 MG tablet; Take 1 tablet (80 mg total) by mouth daily at 6 PM. -     clopidogrel (PLAVIX) 75 MG tablet; Take 1 tablet (75 mg total) by mouth daily.       I have discontinued Daniel Quinter "[name]"'s Fluticasone-Salmeterol. I have also changed his ADVAIR DISKUS to Fluticasone-Salmeterol. Additionally, I am having him maintain his folic acid, thiamine, docusate sodium, cyclobenzaprine, acetaminophen, albuterol, baclofen, Baclofen, pramipexole, atorvastatin, and clopidogrel.  Allergies as of 09/09/2018   No Known Allergies     Medication List        Accurate as of 09/09/18 10:30 PM. Always use your most recent med list.          acetaminophen 500 MG tablet  Commonly known as:  TYLENOL Take 500 mg by mouth every 6 (six) hours as needed for mild pain or moderate pain.   atorvastatin 80 MG tablet Commonly known as:  LIPITOR Take 1 tablet (80 mg total) by mouth daily at 6 PM.   baclofen 10 MG tablet Commonly known as:  LIORESAL Take by mouth.   Baclofen 5 MG Tabs Take 5 mg by mouth 3 (three) times daily.   clopidogrel 75 MG tablet Commonly known as:  PLAVIX Take 1 tablet (75 mg total) by mouth daily.   cyclobenzaprine 5 MG tablet Commonly known as:  FLEXERIL Take 5 mg by mouth at bedtime as needed for muscle spasms.   docusate sodium  100 MG capsule Commonly known as:  COLACE Take 100 mg by mouth 2 (two) times daily.   Fluticasone-Salmeterol 250-50 MCG/DOSE Aepb Commonly known as:  ADVAIR TAKE 1 PUFF BY MOUTH TWICE A DAY   folic acid 1 MG tablet Commonly known as:  FOLVITE Take 1 tablet (1 mg total) by mouth daily.   pramipexole 0.125 MG tablet Commonly known as:  MIRAPEX Take 1 tablet (0.125 mg total) by mouth at bedtime.   PROVENTIL HFA 108 (90 Base) MCG/ACT inhaler Generic drug:  albuterol Inhale into the lungs.   thiamine 100 MG tablet Take 1 tablet (100 mg total) by mouth daily.        Follow-up: No follow-ups on file.  [name], M.D.

## 2018-09-09 NOTE — Patient Instructions (Signed)
Please follow up with Dr. Livia Snellen in 6 months.

## 2018-09-10 ENCOUNTER — Other Ambulatory Visit: Payer: 59

## 2018-09-10 DIAGNOSIS — I10 Essential (primary) hypertension: Secondary | ICD-10-CM | POA: Diagnosis not present

## 2018-09-10 DIAGNOSIS — E785 Hyperlipidemia, unspecified: Secondary | ICD-10-CM | POA: Diagnosis not present

## 2018-09-11 ENCOUNTER — Other Ambulatory Visit: Payer: Self-pay | Admitting: Family Medicine

## 2018-09-11 LAB — CBC WITH DIFFERENTIAL/PLATELET
Basophils Absolute: 0 10*3/uL (ref 0.0–0.2)
Basos: 1 %
EOS (ABSOLUTE): 0.2 10*3/uL (ref 0.0–0.4)
Eos: 3 %
Hematocrit: 39.5 % (ref 37.5–51.0)
Hemoglobin: 13.1 g/dL (ref 13.0–17.7)
Immature Grans (Abs): 0 10*3/uL (ref 0.0–0.1)
Immature Granulocytes: 0 %
Lymphocytes Absolute: 1.1 10*3/uL (ref 0.7–3.1)
Lymphs: 22 %
MCH: 31.8 pg (ref 26.6–33.0)
MCHC: 33.2 g/dL (ref 31.5–35.7)
MCV: 96 fL (ref 79–97)
Monocytes Absolute: 0.5 10*3/uL (ref 0.1–0.9)
Monocytes: 9 %
Neutrophils Absolute: 3.5 10*3/uL (ref 1.4–7.0)
Neutrophils: 65 %
Platelets: 201 10*3/uL (ref 150–450)
RBC: 4.12 x10E6/uL — ABNORMAL LOW (ref 4.14–5.80)
RDW: 13.7 % (ref 12.3–15.4)
WBC: 5.2 10*3/uL (ref 3.4–10.8)

## 2018-09-11 LAB — CMP14+EGFR
ALT: 30 IU/L (ref 0–44)
AST: 37 IU/L (ref 0–40)
Albumin/Globulin Ratio: 2 (ref 1.2–2.2)
Albumin: 4.3 g/dL (ref 3.6–4.8)
Alkaline Phosphatase: 73 IU/L (ref 39–117)
BUN/Creatinine Ratio: 9 — ABNORMAL LOW (ref 10–24)
BUN: 10 mg/dL (ref 8–27)
Bilirubin Total: 1.1 mg/dL (ref 0.0–1.2)
CO2: 23 mmol/L (ref 20–29)
Calcium: 9 mg/dL (ref 8.6–10.2)
Chloride: 104 mmol/L (ref 96–106)
Creatinine, Ser: 1.08 mg/dL (ref 0.76–1.27)
GFR calc Af Amer: 85 mL/min/{1.73_m2} (ref >59)
GFR calc non Af Amer: 73 mL/min/{1.73_m2} (ref >59)
Globulin, Total: 2.2 g/dL (ref 1.5–4.5)
Glucose: 106 mg/dL — ABNORMAL HIGH (ref 65–99)
Potassium: 4.5 mmol/L (ref 3.5–5.2)
Sodium: 141 mmol/L (ref 134–144)
Total Protein: 6.5 g/dL (ref 6.0–8.5)

## 2018-09-11 LAB — LIPID PANEL
Chol/HDL Ratio: 1.6 ratio (ref 0.0–5.0)
Cholesterol, Total: 129 mg/dL (ref 100–199)
HDL: 79 mg/dL (ref >39)
LDL Calculated: 42 mg/dL (ref 0–99)
Triglycerides: 38 mg/dL (ref 0–149)
VLDL Cholesterol Cal: 8 mg/dL (ref 5–40)

## 2018-09-11 NOTE — Progress Notes (Signed)
Hello Castin,  Your lab result is normal.Some minor variations that are not significant are commonly marked abnormal, but do not represent any medical problem for you.  Best regards, Bristyl Mclees, M.D.

## 2018-09-19 DIAGNOSIS — I701 Atherosclerosis of renal artery: Secondary | ICD-10-CM | POA: Diagnosis not present

## 2018-09-19 DIAGNOSIS — I150 Renovascular hypertension: Secondary | ICD-10-CM | POA: Diagnosis not present

## 2018-09-19 DIAGNOSIS — Z95828 Presence of other vascular implants and grafts: Secondary | ICD-10-CM | POA: Diagnosis not present

## 2018-10-12 ENCOUNTER — Ambulatory Visit: Payer: 59 | Admitting: Family Medicine

## 2018-11-02 HISTORY — PX: PTERYGIUM EXCISION: SHX2273

## 2018-12-05 ENCOUNTER — Ambulatory Visit: Payer: 59 | Admitting: Adult Health

## 2018-12-05 ENCOUNTER — Encounter: Payer: Self-pay | Admitting: Adult Health

## 2018-12-05 VITALS — BP 134/74 | HR 83 | Ht 70.5 in | Wt 191.8 lb

## 2018-12-05 DIAGNOSIS — I10 Essential (primary) hypertension: Secondary | ICD-10-CM | POA: Diagnosis not present

## 2018-12-05 DIAGNOSIS — E785 Hyperlipidemia, unspecified: Secondary | ICD-10-CM

## 2018-12-05 DIAGNOSIS — M62838 Other muscle spasm: Secondary | ICD-10-CM | POA: Diagnosis not present

## 2018-12-05 DIAGNOSIS — I639 Cerebral infarction, unspecified: Secondary | ICD-10-CM | POA: Diagnosis not present

## 2018-12-05 NOTE — Patient Instructions (Signed)
Continue clopidogrel 75 mg daily  and lipitor  for secondary stroke prevention  Continue baclofen 5mg  three times daily as needed for muscle cramps  Continue to follow up with PCP regarding cholesterol and blood pressure management   Continue to monitor blood pressure at home  Maintain strict control of hypertension with blood pressure goal below 130/90, diabetes with hemoglobin A1c goal below 6.5% and cholesterol with LDL cholesterol (bad cholesterol) goal below 70 mg/dL. I also advised the patient to eat a healthy diet with plenty of whole grains, cereals, fruits and vegetables, exercise regularly and maintain ideal body weight.  Followup in the future with me in 6 months or call earlier if needed       Thank you for coming to see [place] at [facility]. I hope we have been able to provide you high quality care today.  You may receive a patient satisfaction survey over the next few weeks. We would appreciate your feedback and comments so that we may continue to improve ourselves and the health of our patients.

## 2018-12-05 NOTE — Progress Notes (Signed)
[facility] [address] [place] [address] [phone]       OFFICE FOLLOW UP NOTE  [name] Date of Birth:  October 24, 1956 Medical Record Number:  [id number]   Reason for Referral:  hospital stroke follow up  CHIEF COMPLAINT:  Chief Complaint  Patient presents with  . Follow-up    6 month follow up. Alone. Rm 9. Patient wants to know [name] any of the medication he's taking causing him to gain weight? He stated that he has been cramp free.     HPI:  12/05/18  Daniel [name] [name] being seen today for stroke follow up visit.  Overall, patient has been stable from a stroke standpoint.  He continues on Plavix without side effects of bleeding or bruising.  Continues on atorvastatin without side effects myalgias.  He did have lipid panel obtained on 09/10/2018 which showed LDL 42.  Blood pressure today 134/74.  He continues on baclofen 5 mg 3 times daily for lower extremity spasms with benefit.  He occasionally will use Flexeril 5 mg for breakthrough spasms.  He does have concerns today of weight gain and [name] questioning whether a specific medication could be doing this.  Denies new or worsening stroke/TIA symptoms.   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 History obtained from patient, wife and chart review. Reviewed all radiology images and labs personally.  Daniel [name] [name] a 63 y.o. male with history of pulmonary nodule, ongoing tobacco use, ETOH use, hypertension, hyperlipidemia, COPD, CKD with likely renal artery stenosis presenting with intermittent right-sided weakness. He did not receive IV t-PA due to resolution of deficits and late presentation. However, at lunch time, his right sided weakness returned. CT head reviewed and showed chronic stable mild to moderate small vessel ischemic disease with no significant change from prior imaging.   Initial MRI showed no acute intercranial abnormality.  Repeat MRI did show acute/early subacute infarctions within the left posterior limb of internal capsule and right posteromedial thalamus.  Carotid Dopplers are unremarkable.  2D echo showed an EF of 60 to 65%.  Patient was not on antithrombotic prior to admission but due to concerns of capsular warning syndrome, he was loaded with aspirin and Plavix along with Lipitor 80 mg and recommended to continue DAPT for 3 weeks and then Plavix alone.  LDL 134 and recommended to continue Lipitor 80 mg at discharge.  Therapies recommended CIR placement.   Patient [name] being seen today for hospital follow-up visit and [name] accompanied by his wife.  All symptoms have resolved and he [name] back to doing all normal activities.  He does complain of daily headaches since his stroke.  Denies phonophobia or nausea vomiting but does endorse photophobia.  He has been taking extra strength Tylenol which helps relieve these headaches.  They are not debilitating as he can continue to do certain activities but also states he has not been as active due to recent renal stent placement.  Patient also complains of muscle cramps that he states [name] been occurring for the past 5 years where his muscles will contract and become very painfu.  This typically happens when patient has increased fatigue or with increased activity.  They are also worse in the evening or with cold temperature.  He has recently been taking Flexeril without much relief.  Patient states he has had multiple blood tests in the past by  primary providers but all negative.  Continues to take both aspirin and Plavix without side effects of bleeding or bruising.  Continues to take Lipitor without increased side effects of myalgias.  Blood pressure today at beginning of visit 153/93 and at recheck at end of visit 102/60.  States he has not smoked cigarettes since hospital discharge.  Denies new or worsening stroke/TIA  symptoms.  06/01/18 UPDATE: Patient [name] being seen today for routine follow-up appointment.  Patient did undergo EMG/nerve conduction study but only showed very mild bilateral lower extremity neuropathy.  Patient continues to take baclofen twice daily which has greatly reduced his muscle spasms.  He states on occasion he may have increase spasms during the day in between doses but overall feels as though these have greatly improved.  He has continued to tolerate baclofen well without side effects.  Does have complaints of continued dizziness for which she has had for the past 5 years that occurs with rapid movement or position changes and lasts only seconds.  Patient was provided with BPPV home exercises as he [name] declining PT at this time.  Continues to take Plavix without side effects of bleeding or bruising.  Continues to take Lipitor without side effects myalgias.  Blood pressure today satisfactory 133/74.  Denies new or worsening stroke/TIA symptoms.   ROS:   14 system review of systems performed and negative with exception of no complaints  PMH:  Past Medical History:  Diagnosis Date  . BPH (benign prostatic hyperplasia)   . COPD (chronic obstructive pulmonary disease) ([place])   . Hyperlipidemia   . Hypertension   . Pulmonary nodule   . renal stents   . Stroke [facility])     PSH:  Past Surgical History:  Procedure Laterality Date  . CERVICAL FUSION    . NO PAST SURGERIES    . renal stents      Social History:  Social History   Socioeconomic History  . Marital status: Married    Spouse name: Not on file  . Number of children: Not on file  . Years of education: Not on file  . Highest education level: Not on file  Occupational History  . Occupation: Surveyor, mining  . Financial resource strain: Not on file  . Food insecurity:    Worry: Not on file    Inability: Not on file  . Transportation needs:    Medical: Not on file    Non-medical: Not on file  Tobacco Use  .  Smoking status: Former Smoker    Packs/day: 1.00    Years: 42.00    Pack years: 42.00    Types: Cigarettes  . Smokeless tobacco: Never Used  Substance and Sexual Activity  . Alcohol use: Never    Frequency: Never    Comment: not dirnking now  . Drug use: Never  . Sexual activity: Not on file  Lifestyle  . Physical activity:    Days per week: Not on file    Minutes per session: Not on file  . Stress: Not on file  Relationships  . Social connections:    Talks on phone: Not on file    Gets together: Not on file    Attends religious service: Not on file    Active member of club or organization: Not on file    Attends meetings of clubs or organizations: Not on file    Relationship status: Not on file  . Intimate partner violence:    Fear of current  or ex partner: Not on file    Emotionally abused: Not on file    Physically abused: Not on file    Forced sexual activity: Not on file  Other Topics Concern  . Not on file  Social History Narrative  . Not on file    Family History:  Family History  Problem Relation Age of Onset  . Emphysema Father   . Cancer Father   . Lung cancer Mother     Medications:   Current Outpatient Medications on File Prior to Visit  Medication Sig Dispense Refill  . acetaminophen (TYLENOL) 500 MG tablet Take 500 mg by mouth every 6 (six) hours as needed for mild pain or moderate pain.    Marland Kitchen atorvastatin (LIPITOR) 80 MG tablet Take 1 tablet (80 mg total) by mouth daily at 6 PM. 90 tablet 1  . Baclofen 5 MG TABS Take 5 mg by mouth 3 (three) times daily. 180 tablet 4  . clopidogrel (PLAVIX) 75 MG tablet Take 1 tablet (75 mg total) by mouth daily. 90 tablet 1  . cyclobenzaprine (FLEXERIL) 5 MG tablet Take 5 mg by mouth at bedtime as needed for muscle spasms.     Marland Kitchen docusate sodium (COLACE) 100 MG capsule Take 100 mg by mouth 2 (two) times daily.    . Fluticasone-Salmeterol (ADVAIR DISKUS) 250-50 MCG/DOSE AEPB TAKE 1 PUFF BY MOUTH TWICE A DAY 60 each 5   . folic acid (FOLVITE) 1 MG tablet Take 1 tablet (1 mg total) by mouth daily. 30 tablet 0  . pramipexole (MIRAPEX) 0.125 MG tablet TAKE 1 TABLET(0.125 MG) BY MOUTH AT BEDTIME 90 tablet 0  . thiamine 100 MG tablet Take 1 tablet (100 mg total) by mouth daily. 30 tablet 0  . albuterol (PROVENTIL HFA) 108 (90 Base) MCG/ACT inhaler Inhale into the lungs.     No current facility-administered medications on file prior to visit.     Allergies:  No Known Allergies   Physical Exam  Vitals:   12/05/18 1507  BP: 134/74  Pulse: 83  Weight: 191 lb 12.8 oz (87 kg)  Height: 5' 10.5" (1.791 m)   Body mass index [name] 27.13 kg/m. No exam data present  General: Pleasant middle-aged Caucasian male, seated, in no evident distress Head: head normocephalic and atraumatic.   Neck: supple with no carotid or supraclavicular bruits Cardiovascular: regular rate and rhythm, no murmurs Musculoskeletal: no deformity Skin:  no rash/petichiae Vascular:  Normal pulses all extremities  Neurologic Exam Mental Status: Awake and fully alert. Oriented to place and time. Recent and remote memory intact. Attention span, concentration and fund of knowledge appropriate. Mood and affect appropriate.  Cranial Nerves: Pupils equal, briskly reactive to light. Extraocular movements full without nystagmus. Visual fields full to confrontation. Hearing intact. Facial sensation intact. Face, tongue, palate moves normally and symmetrically.  Motor: Normal bulk and tone. Normal strength in all tested extremity muscles. Sensory.: intact to touch , pinprick , position and vibratory sensation.  Coordination: Rapid alternating movements normal in all extremities. Finger-to-nose and heel-to-shin performed accurately bilaterally. Gait and Station: Arises from chair without difficulty. Stance [name] normal. Gait demonstrates normal stride length and balance . Able to heel, toe and tandem walk without difficulty.  Reflexes: 1+ and symmetric.  Toes downgoing.       Diagnostic Data (Labs, Imaging, Testing)  Ct Head Wo Contrast 03/10/2018 IMPRESSION:  Chronic stable mild-to-moderate small vessel ischemic disease. No significant change from prior. No acute intracranial abnormality [name] identified.  MRI HEAD 03/11/2018 1. No acute intracranial abnormality.  2. Mild to moderate chronic small vessel ischemic change.   MRI HEAD (REPEAT) 03/12/2018 IMPRESSION: 1. 6 mm acute/early subacute infarctions within left posterior limb of internal capsule and right posteromedial thalamus. No hemorrhage or mass effect. 2. Stable mild-to-moderate chronic microvascular ischemic changes and parenchymal volume loss of the brain.  MRA HEAD IMPRESSION  1. Negative intracranial MRA for large vessel occlusion.  2. Mild atherosclerotic change for age without hemodynamically significant or correctable stenosis.   Transthoracic Echocardiogram  - LVEF 60-65%, normal wall thickness, normal wall motion, normal diastolic function, trivial MR, moderate biatrial enlargement, trivial TR, RVSP 40 mmHg, dilated IVC.  Bilateral Carotid Dopplers  Findings suggest 1-39% internal carotid artery stenosis bilaterally. Vertebral arteries are patent with antegrade flow.   ASSESSMENT: Daniel [name] [name] a 63 y.o. year old male here with left internal capsule and right thalamus on 03/10/2018 secondary to small vessel disease. Vascular risk factors include HTN, HLD and smoking.  He has been seen today for follow-up visit and continues to do well from a stroke standpoint.    PLAN: -Continue clopidogrel 75 mg daily and Lipitor for secondary stroke prevention -Continue baclofen 5 mg 3 times a day for muscle spasms -Advised to continue to stay active and maintain a healthy diet -Advised him that his weight gain [name] most likely due to his quitting smoking and lack of physical activity.  It was recommended for him to increase his physical activity along with  ensuring healthy diet.  If he continues to experience weight gain despite recommended weight loss attempts, advised her to follow-up with PCP -F/u with PCP regarding your HLD and HTN management -continue to monitor BP at home -Maintain strict control of hypertension with blood pressure goal below 130/90, diabetes with hemoglobin A1c goal below 6.5% and cholesterol with LDL cholesterol (bad cholesterol) goal below 70 mg/dL. I also advised the patient to eat a healthy diet with plenty of whole grains, cereals, fruits and vegetables, exercise regularly and maintain ideal body weight.   Follow up in 6 months or call earlier if needed   Greater than 50% of time during this 25 minute visit was spent on counseling,explanation of diagnosis of left internal capsule and right thalamus, reviewing risk factor management of HTN and HLD, planning of further management, discussion with patient and family and coordination of care    Daniel [name], [facility]  [facility] [address] [place], [place] [address]  Phone [phone] Fax [phone]

## 2018-12-05 NOTE — Progress Notes (Signed)
I agree with the above plan 

## 2018-12-20 DIAGNOSIS — I701 Atherosclerosis of renal artery: Secondary | ICD-10-CM | POA: Diagnosis not present

## 2018-12-20 DIAGNOSIS — Z95820 Peripheral vascular angioplasty status with implants and grafts: Secondary | ICD-10-CM | POA: Diagnosis not present

## 2018-12-20 DIAGNOSIS — Z95828 Presence of other vascular implants and grafts: Secondary | ICD-10-CM | POA: Diagnosis not present

## 2018-12-20 DIAGNOSIS — I150 Renovascular hypertension: Secondary | ICD-10-CM | POA: Diagnosis not present

## 2019-01-18 ENCOUNTER — Other Ambulatory Visit: Payer: Self-pay

## 2019-01-18 ENCOUNTER — Encounter: Payer: Self-pay | Admitting: Family Medicine

## 2019-01-18 ENCOUNTER — Ambulatory Visit (INDEPENDENT_AMBULATORY_CARE_PROVIDER_SITE_OTHER): Payer: 59 | Admitting: Family Medicine

## 2019-01-18 ENCOUNTER — Ambulatory Visit: Payer: 59 | Admitting: *Deleted

## 2019-01-18 VITALS — BP 152/93 | HR 64 | Temp 97.3°F | Ht 70.5 in | Wt 197.2 lb

## 2019-01-18 VITALS — BP 157/96 | HR 68

## 2019-01-18 DIAGNOSIS — I150 Renovascular hypertension: Secondary | ICD-10-CM

## 2019-01-18 DIAGNOSIS — I10 Essential (primary) hypertension: Secondary | ICD-10-CM

## 2019-01-18 MED ORDER — METOPROLOL SUCCINATE ER 50 MG PO TB24: 50.0000 mg | ORAL_TABLET | Freq: Every day | ORAL | 1 refills | Status: DC

## 2019-01-18 NOTE — Progress Notes (Signed)
Subjective:  Patient ID: Daniel [name], male    DOB: 1955-12-30  Age: 63 y.o. MRN: [id number]  CC: Medical Management of Chronic Issues (pt here today c/o elevated BP, headaches and nosebleeds)   HPI Casimir Barcellos presents for Elevated blood pressure.  He has had multiple elevated readings over the last few weeks.  One noted in his vascular doctor's office recently.  It staying in the 150s over 90s to 100s at home.  They brought in the blood pressure cuff today and compared it to ours and got similar readings giving more credence to his home readings.  He has had no fever chills or sweats.  He has had no cough.  Patient does have a history of renal stenosis.  He has bilateral stents in place.  Blood pressures been good until recently ever since the stents were placed last year.  Prior to that time his blood pressure could not be controlled no matter what medicine was tried by me or multiple specialists.  Depression screen [facility] 2/9 01/18/2019 09/09/2018 08/09/2018  Decreased Interest 0 1 0  Down, Depressed, Hopeless 0 0 0  PHQ - 2 Score 0 1 0    History [place] has a past medical history of BPH (benign prostatic hyperplasia), COPD (chronic obstructive pulmonary disease) ([place]), Hyperlipidemia, Hypertension, Pulmonary nodule, renal stents, and Stroke ([place]).   He has a past surgical history that includes No past surgeries; renal stents; and Cervical fusion.   His family history includes Cancer in his father; Emphysema in his father; Lung cancer in his mother.He reports that he has quit smoking. His smoking use included cigarettes. He has a 42.00 pack-year smoking history. He has never used smokeless tobacco. He reports that he does not drink alcohol or use drugs.    ROS Review of Systems  Constitutional: Negative.   HENT: Negative.   Eyes: Negative for visual disturbance.  Respiratory: Negative for cough and shortness of breath.   Cardiovascular: Negative for chest pain and leg  swelling.  Gastrointestinal: Negative for abdominal pain, diarrhea, nausea and vomiting.  Genitourinary: Negative for difficulty urinating.  Musculoskeletal: Negative for arthralgias and myalgias.  Skin: Negative for rash.  Neurological: Negative for headaches.  Psychiatric/Behavioral: Negative for sleep disturbance.    Objective:  BP (!) 152/93   Pulse 64   Temp (!) 97.3 F (36.3 C) (Oral)   Ht 5' 10.5" (1.791 m)   Wt 197 lb 4 oz (89.5 kg)   BMI 27.90 kg/m   BP Readings from Last 3 Encounters:  01/18/19 (!) 152/93  01/18/19 (!) 157/96  12/05/18 134/74    Wt Readings from Last 3 Encounters:  01/18/19 197 lb 4 oz (89.5 kg)  12/05/18 191 lb 12.8 oz (87 kg)  09/09/18 183 lb (83 kg)     Physical Exam Vitals signs reviewed.  Constitutional:      Appearance: He is well-developed.  HENT:     Head: Normocephalic and atraumatic.     Right Ear: External ear normal.     Left Ear: External ear normal.     Mouth/Throat:     Pharynx: No oropharyngeal exudate or posterior oropharyngeal erythema.  Eyes:     Pupils: Pupils are equal, round, and reactive to light.  Neck:     Musculoskeletal: Normal range of motion and neck supple.  Cardiovascular:     Rate and Rhythm: Normal rate and regular rhythm.     Heart sounds: No murmur.  Pulmonary:     Effort: No respiratory  distress.     Breath sounds: Normal breath sounds.  Neurological:     Mental Status: He is alert and oriented to person, place, and time.       Assessment & Plan:   Derrion was seen today for medical management of chronic issues.  Diagnoses and all orders for this visit:  Renovascular hypertension  Essential hypertension -     CMP14+EGFR  Other orders -     metoprolol succinate (TOPROL-XL) 50 MG 24 hr tablet; Take 1 tablet (50 mg total) by mouth daily. Take with or immediately following a meal.       I am having Carola Rhine "[name]" start on metoprolol succinate. I am also having him  maintain his folic acid, thiamine, docusate sodium, cyclobenzaprine, acetaminophen, albuterol, Baclofen, Fluticasone-Salmeterol, atorvastatin, clopidogrel, and pramipexole.  Allergies as of 01/18/2019   No Known Allergies     Medication List       Accurate as of January 18, 2019  5:08 PM. Always use your most recent med list.        acetaminophen 500 MG tablet Commonly known as:  TYLENOL Take 500 mg by mouth every 6 (six) hours as needed for mild pain or moderate pain.   atorvastatin 80 MG tablet Commonly known as:  LIPITOR Take 1 tablet (80 mg total) by mouth daily at 6 PM.   Baclofen 5 MG Tabs Take 5 mg by mouth 3 (three) times daily.   clopidogrel 75 MG tablet Commonly known as:  PLAVIX Take 1 tablet (75 mg total) by mouth daily.   cyclobenzaprine 5 MG tablet Commonly known as:  FLEXERIL Take 5 mg by mouth at bedtime as needed for muscle spasms.   docusate sodium 100 MG capsule Commonly known as:  COLACE Take 100 mg by mouth 2 (two) times daily.   Fluticasone-Salmeterol 250-50 MCG/DOSE Aepb Commonly known as:  Advair Diskus TAKE 1 PUFF BY MOUTH TWICE A DAY   folic acid 1 MG tablet Commonly known as:  FOLVITE Take 1 tablet (1 mg total) by mouth daily.   metoprolol succinate 50 MG 24 hr tablet Commonly known as:  TOPROL-XL Take 1 tablet (50 mg total) by mouth daily. Take with or immediately following a meal.   pramipexole 0.125 MG tablet Commonly known as:  MIRAPEX TAKE 1 TABLET(0.125 MG) BY MOUTH AT BEDTIME   Proventil HFA 108 (90 Base) MCG/ACT inhaler Generic drug:  albuterol Inhale into the lungs.   thiamine 100 MG tablet Take 1 tablet (100 mg total) by mouth daily.        Follow-up: Return in about 1 month (around 02/18/2019).  [name], M.D.

## 2019-01-18 NOTE — Progress Notes (Signed)
Patient states he has been having frequent headaches and nosebleeds.  His wife is concerned that BP is elevated, and he has history of TIA 03/2018. His blood pressure according to our dynamap is 157/96 and on his BP machine from home it was 163-97.  Given patient's symptoms and elevated BP readings scheduled him for an appointment with Dr. Livia Snellen today.

## 2019-01-18 NOTE — Patient Instructions (Signed)
DASH Eating Plan  DASH stands for "Dietary Approaches to Stop Hypertension." The DASH eating plan is a healthy eating plan that has been shown to reduce high blood pressure (hypertension). It may also reduce your risk for type 2 diabetes, heart disease, and stroke. The DASH eating plan may also help with weight loss.  What are tips for following this plan?    General guidelines   Avoid eating more than 2,300 mg (milligrams) of salt (sodium) a day. If you have hypertension, you may need to reduce your sodium intake to 1,500 mg a day.   Limit alcohol intake to no more than 1 drink a day for nonpregnant women and 2 drinks a day for men. One drink equals 12 oz of beer, 5 oz of wine, or 1 oz of hard liquor.   Work with your health care provider to maintain a healthy body weight or to lose weight. Ask what an ideal weight is for you.   Get at least 30 minutes of exercise that causes your heart to beat faster (aerobic exercise) most days of the week. Activities may include walking, swimming, or biking.   Work with your health care provider or diet and nutrition specialist (dietitian) to adjust your eating plan to your individual calorie needs.  Reading food labels     Check food labels for the amount of sodium per serving. Choose foods with less than 5 percent of the Daily Value of sodium. Generally, foods with less than 300 mg of sodium per serving fit into this eating plan.   To find whole grains, look for the word "whole" as the first word in the ingredient list.  Shopping   Buy products labeled as "low-sodium" or "no salt added."   Buy fresh foods. Avoid canned foods and premade or frozen meals.  Cooking   Avoid adding salt when cooking. Use salt-free seasonings or herbs instead of table salt or sea salt. Check with your health care provider or pharmacist before using salt substitutes.   Do not fry foods. Cook foods using healthy methods such as baking, boiling, grilling, and broiling instead.   Cook with  heart-healthy oils, such as olive, canola, soybean, or sunflower oil.  Meal planning   Eat a balanced diet that includes:  ? 5 or more servings of fruits and vegetables each day. At each meal, try to fill half of your plate with fruits and vegetables.  ? Up to 6-8 servings of whole grains each day.  ? Less than 6 oz of lean meat, poultry, or fish each day. A 3-oz serving of meat is about the same size as a deck of cards. One egg equals 1 oz.  ? 2 servings of low-fat dairy each day.  ? A serving of nuts, seeds, or beans 5 times each week.  ? Heart-healthy fats. Healthy fats called Omega-3 fatty acids are found in foods such as flaxseeds and coldwater fish, like sardines, salmon, and mackerel.   Limit how much you eat of the following:  ? Canned or prepackaged foods.  ? Food that is high in trans fat, such as fried foods.  ? Food that is high in saturated fat, such as fatty meat.  ? Sweets, desserts, sugary drinks, and other foods with added sugar.  ? Full-fat dairy products.   Do not salt foods before eating.   Try to eat at least 2 vegetarian meals each week.   Eat more home-cooked food and less restaurant, buffet, and fast food.     When eating at a restaurant, ask that your food be prepared with less salt or no salt, if possible.  What foods are recommended?  The items listed may not be a complete list. Talk with your dietitian about what dietary choices are best for you.  Grains  Whole-grain or whole-wheat bread. Whole-grain or whole-wheat pasta. Brown rice. Oatmeal. Quinoa. Bulgur. Whole-grain and low-sodium cereals. Pita bread. Low-fat, low-sodium crackers. Whole-wheat flour tortillas.  Vegetables  Fresh or frozen vegetables (raw, steamed, roasted, or grilled). Low-sodium or reduced-sodium tomato and vegetable juice. Low-sodium or reduced-sodium tomato sauce and tomato paste. Low-sodium or reduced-sodium canned vegetables.  Fruits  All fresh, dried, or frozen fruit. Canned fruit in natural juice (without  added sugar).  Meat and other protein foods  Skinless chicken or turkey. Ground chicken or turkey. Pork with fat trimmed off. Fish and seafood. Egg whites. Dried beans, peas, or lentils. Unsalted nuts, nut butters, and seeds. Unsalted canned beans. Lean cuts of beef with fat trimmed off. Low-sodium, lean deli meat.  Dairy  Low-fat (1%) or fat-free (skim) milk. Fat-free, low-fat, or reduced-fat cheeses. Nonfat, low-sodium ricotta or cottage cheese. Low-fat or nonfat yogurt. Low-fat, low-sodium cheese.  Fats and oils  Soft margarine without trans fats. Vegetable oil. Low-fat, reduced-fat, or light mayonnaise and salad dressings (reduced-sodium). Canola, safflower, olive, soybean, and sunflower oils. Avocado.  Seasoning and other foods  Herbs. Spices. Seasoning mixes without salt. Unsalted popcorn and pretzels. Fat-free sweets.  What foods are not recommended?  The items listed may not be a complete list. Talk with your dietitian about what dietary choices are best for you.  Grains  Baked goods made with fat, such as croissants, muffins, or some breads. Dry pasta or rice meal packs.  Vegetables  Creamed or fried vegetables. Vegetables in a cheese sauce. Regular canned vegetables (not low-sodium or reduced-sodium). Regular canned tomato sauce and paste (not low-sodium or reduced-sodium). Regular tomato and vegetable juice (not low-sodium or reduced-sodium). Pickles. Olives.  Fruits  Canned fruit in a light or heavy syrup. Fried fruit. Fruit in cream or butter sauce.  Meat and other protein foods  Fatty cuts of meat. Ribs. Fried meat. Bacon. Sausage. Bologna and other processed lunch meats. Salami. Fatback. Hotdogs. Bratwurst. Salted nuts and seeds. Canned beans with added salt. Canned or smoked fish. Whole eggs or egg yolks. Chicken or turkey with skin.  Dairy  Whole or 2% milk, cream, and half-and-half. Whole or full-fat cream cheese. Whole-fat or sweetened yogurt. Full-fat cheese. Nondairy creamers. Whipped toppings.  Processed cheese and cheese spreads.  Fats and oils  Butter. Stick margarine. Lard. Shortening. Ghee. Bacon fat. Tropical oils, such as coconut, palm kernel, or palm oil.  Seasoning and other foods  Salted popcorn and pretzels. Onion salt, garlic salt, seasoned salt, table salt, and sea salt. Worcestershire sauce. Tartar sauce. Barbecue sauce. Teriyaki sauce. Soy sauce, including reduced-sodium. Steak sauce. Canned and packaged gravies. Fish sauce. Oyster sauce. Cocktail sauce. Horseradish that you find on the shelf. Ketchup. Mustard. Meat flavorings and tenderizers. Bouillon cubes. Hot sauce and Tabasco sauce. Premade or packaged marinades. Premade or packaged taco seasonings. Relishes. Regular salad dressings.  Where to find more information:   National Heart, Lung, and Blood Institute: www.nhlbi.nih.gov   American Heart Association: www.heart.org  Summary   The DASH eating plan is a healthy eating plan that has been shown to reduce high blood pressure (hypertension). It may also reduce your risk for type 2 diabetes, heart disease, and stroke.   With the   DASH eating plan, you should limit salt (sodium) intake to 2,300 mg a day. If you have hypertension, you may need to reduce your sodium intake to 1,500 mg a day.   When on the DASH eating plan, aim to eat more fresh fruits and vegetables, whole grains, lean proteins, low-fat dairy, and heart-healthy fats.   Work with your health care provider or diet and nutrition specialist (dietitian) to adjust your eating plan to your individual calorie needs.  This information is not intended to replace advice given to you by your health care provider. Make sure you discuss any questions you have with your health care provider.  Document Released: 10/08/2011 Document Revised: 10/12/2016 Document Reviewed: 10/12/2016  Elsevier Interactive Patient Education  2019 [employer] Inc[employer]

## 2019-01-19 LAB — CMP14+EGFR
ALT: 30 IU/L (ref 0–44)
AST: 34 IU/L (ref 0–40)
Albumin/Globulin Ratio: 1.8 (ref 1.2–2.2)
Albumin: 4.8 g/dL (ref 3.8–4.8)
Alkaline Phosphatase: 77 IU/L (ref 39–117)
BUN/Creatinine Ratio: 10 (ref 10–24)
BUN: 11 mg/dL (ref 8–27)
Bilirubin Total: 1.1 mg/dL (ref 0.0–1.2)
CO2: 25 mmol/L (ref 20–29)
Calcium: 10 mg/dL (ref 8.6–10.2)
Chloride: 100 mmol/L (ref 96–106)
Creatinine, Ser: 1.07 mg/dL (ref 0.76–1.27)
GFR calc Af Amer: 86 mL/min/{1.73_m2} (ref >59)
GFR calc non Af Amer: 74 mL/min/{1.73_m2} (ref >59)
Globulin, Total: 2.6 g/dL (ref 1.5–4.5)
Glucose: 90 mg/dL (ref 65–99)
Potassium: 4.4 mmol/L (ref 3.5–5.2)
Sodium: 142 mmol/L (ref 134–144)
Total Protein: 7.4 g/dL (ref 6.0–8.5)

## 2019-01-19 NOTE — Progress Notes (Signed)
Hello Armas,  Your lab result is normal.Some minor variations that are not significant are commonly marked abnormal, but do not represent any medical problem for you.  Best regards, Michelena Culmer, M.D.

## 2019-02-01 ENCOUNTER — Ambulatory Visit: Payer: 59 | Admitting: Family Medicine

## 2019-02-01 ENCOUNTER — Other Ambulatory Visit: Payer: Self-pay

## 2019-02-01 ENCOUNTER — Encounter: Payer: Self-pay | Admitting: Family Medicine

## 2019-02-01 VITALS — BP 155/75 | HR 72 | Temp 97.6°F | Ht 70.5 in | Wt 199.0 lb

## 2019-02-01 DIAGNOSIS — I150 Renovascular hypertension: Secondary | ICD-10-CM | POA: Diagnosis not present

## 2019-02-01 DIAGNOSIS — I701 Atherosclerosis of renal artery: Secondary | ICD-10-CM

## 2019-02-01 MED ORDER — METOPROLOL SUCCINATE ER 100 MG PO TB24: 100.0000 mg | ORAL_TABLET | Freq: Every day | ORAL | 1 refills | Status: DC

## 2019-02-01 NOTE — Progress Notes (Signed)
Subjective:  Patient ID: Daniel [name], male    DOB: [birth year]  Age: 63 y.o. MRN: [id number]  CC: Medical Management of Chronic Issues (pt here today for 2 week recheck after starting metoprolol)   HPI Daniel [name] presents for  follow-up of hypertension. Patient has no history of headache chest pain or shortness of breath or recent cough. Patient also denies symptoms of TIA such as focal numbness or weakness. Patient denies side effects from medication. States taking it regularly. Has bilateral renal stents and followed by nephrology. Denies edema, flank and abd. Pain.   Pt. Has COPD. Chronic dyspnea at baseline. He forgets his evening dose of Advair frequently. No fever or sore throat. Chronic cough also at baseline.   History Daniel [name] has a past medical history of BPH (benign prostatic hyperplasia), COPD (chronic obstructive pulmonary disease) ([place]), Hyperlipidemia, Hypertension, Pulmonary nodule, renal stents, and Stroke ([place]).   He has a past surgical history that includes No past surgeries; renal stents; and Cervical fusion.   His family history includes Cancer in his father; Emphysema in his father; Lung cancer in his mother.He reports that he has quit smoking. His smoking use included cigarettes. He has a 42.00 pack-year smoking history. He has never used smokeless tobacco. He reports that he does not drink alcohol or use drugs.  Current Outpatient Medications on File Prior to Visit  Medication Sig Dispense Refill  . acetaminophen (TYLENOL) 500 MG tablet Take 500 mg by mouth every 6 (six) hours as needed for mild pain or moderate pain.    Marland Kitchen albuterol (PROVENTIL HFA) 108 (90 Base) MCG/ACT inhaler Inhale into the lungs.    Marland Kitchen atorvastatin (LIPITOR) 80 MG tablet Take 1 tablet (80 mg total) by mouth daily at 6 PM. 90 tablet 1  . Baclofen 5 MG TABS Take 5 mg by mouth 3 (three) times daily. 180 tablet 4  . clopidogrel (PLAVIX) 75 MG tablet Take 1 tablet (75 mg total) by mouth  daily. 90 tablet 1  . cyclobenzaprine (FLEXERIL) 5 MG tablet Take 5 mg by mouth at bedtime as needed for muscle spasms.     Marland Kitchen docusate sodium (COLACE) 100 MG capsule Take 100 mg by mouth 2 (two) times daily.    . Fluticasone-Salmeterol (ADVAIR DISKUS) 250-50 MCG/DOSE AEPB TAKE 1 PUFF BY MOUTH TWICE A DAY 60 each 5  . folic acid (FOLVITE) 1 MG tablet Take 1 tablet (1 mg total) by mouth daily. 30 tablet 0  . pramipexole (MIRAPEX) 0.125 MG tablet TAKE 1 TABLET(0.125 MG) BY MOUTH AT BEDTIME 90 tablet 0  . thiamine 100 MG tablet Take 1 tablet (100 mg total) by mouth daily. 30 tablet 0   No current facility-administered medications on file prior to visit.     ROS Review of Systems  Constitutional: Negative.   HENT: Negative.   Eyes: Negative for visual disturbance.  Respiratory: Positive for shortness of breath. Negative for cough.   Cardiovascular: Negative for chest pain and leg swelling.  Gastrointestinal: Negative for abdominal pain, diarrhea, nausea and vomiting.  Genitourinary: Negative for difficulty urinating.  Musculoskeletal: Negative for arthralgias and myalgias.  Skin: Negative for rash.  Neurological: Negative for headaches.  Psychiatric/Behavioral: Negative for sleep disturbance.    Objective:  BP (!) 155/75   Pulse 72   Temp 97.6 F (36.4 C) (Oral)   Ht 5' 10.5" (1.791 m)   Wt 199 lb (90.3 kg)   BMI 28.15 kg/m   BP Readings from Last 3 Encounters:  02/01/19 Marland Kitchen)  155/75  01/18/19 (!) 152/93  01/18/19 (!) 157/96    Wt Readings from Last 3 Encounters:  02/01/19 199 lb (90.3 kg)  01/18/19 197 lb 4 oz (89.5 kg)  12/05/18 191 lb 12.8 oz (87 kg)     Physical Exam Constitutional:      General: He is not in acute distress.    Appearance: He is well-developed.  HENT:     Head: Normocephalic and atraumatic.     Right Ear: External ear normal.     Left Ear: External ear normal.     Nose: Nose normal.  Eyes:     Conjunctiva/sclera: Conjunctivae normal.      Pupils: Pupils are equal, round, and reactive to light.  Neck:     Musculoskeletal: Normal range of motion and neck supple.  Cardiovascular:     Rate and Rhythm: Normal rate and regular rhythm.     Heart sounds: Normal heart sounds. No murmur.  Pulmonary:     Effort: Pulmonary effort is normal. No respiratory distress.     Breath sounds: Normal breath sounds. No wheezing or rales.  Abdominal:     Palpations: Abdomen is soft.     Tenderness: There is no abdominal tenderness.  Musculoskeletal: Normal range of motion.  Skin:    General: Skin is warm and dry.  Neurological:     Mental Status: He is alert and oriented to person, place, and time.     Deep Tendon Reflexes: Reflexes are normal and symmetric.  Psychiatric:        Behavior: Behavior normal.        Thought Content: Thought content normal.        Judgment: Judgment normal.       Assessment & Plan:   Daniel [name] was seen today for medical management of chronic issues.  Diagnoses and all orders for this visit:  Renovascular hypertension -     BMP8+EGFR  Renal artery stenosis (HCC) -     BMP8+EGFR  Other orders -     metoprolol succinate (TOPROL-XL) 100 MG 24 hr tablet; Take 1 tablet (100 mg total) by mouth daily. Take with or immediately following a meal.   Allergies as of 02/01/2019   No Known Allergies     Medication List       Accurate as of February 01, 2019  3:28 PM. Always use your most recent med list.        acetaminophen 500 MG tablet Commonly known as:  TYLENOL Take 500 mg by mouth every 6 (six) hours as needed for mild pain or moderate pain.   atorvastatin 80 MG tablet Commonly known as:  LIPITOR Take 1 tablet (80 mg total) by mouth daily at 6 PM.   Baclofen 5 MG Tabs Take 5 mg by mouth 3 (three) times daily.   clopidogrel 75 MG tablet Commonly known as:  PLAVIX Take 1 tablet (75 mg total) by mouth daily.   cyclobenzaprine 5 MG tablet Commonly known as:  FLEXERIL Take 5 mg by mouth at bedtime  as needed for muscle spasms.   docusate sodium 100 MG capsule Commonly known as:  COLACE Take 100 mg by mouth 2 (two) times daily.   Fluticasone-Salmeterol 250-50 MCG/DOSE Aepb Commonly known as:  Advair Diskus TAKE 1 PUFF BY MOUTH TWICE A DAY   folic acid 1 MG tablet Commonly known as:  FOLVITE Take 1 tablet (1 mg total) by mouth daily.   metoprolol succinate 100 MG 24 hr tablet Commonly known as:  TOPROL-XL Take 1 tablet (100 mg total) by mouth daily. Take with or immediately following a meal.   pramipexole 0.125 MG tablet Commonly known as:  MIRAPEX TAKE 1 TABLET(0.125 MG) BY MOUTH AT BEDTIME   Proventil HFA 108 (90 Base) MCG/ACT inhaler Generic drug:  albuterol Inhale into the lungs.   thiamine 100 MG tablet Take 1 tablet (100 mg total) by mouth daily.       Meds ordered this encounter  Medications  . metoprolol succinate (TOPROL-XL) 100 MG 24 hr tablet    Sig: Take 1 tablet (100 mg total) by mouth daily. Take with or immediately following a meal.    Dispense:  90 tablet    Refill:  1    Weight loss reviewed. Cut carbs and portions. Exercise 30 min. 5 times a week.  Follow-up: Return in about 6 weeks (around 03/15/2019) for hypertension.  [name], M.D.

## 2019-02-02 LAB — BMP8+EGFR
BUN/Creatinine Ratio: 11 (ref 10–24)
BUN: 12 mg/dL (ref 8–27)
CO2: 24 mmol/L (ref 20–29)
Calcium: 9.5 mg/dL (ref 8.6–10.2)
Chloride: 101 mmol/L (ref 96–106)
Creatinine, Ser: 1.13 mg/dL (ref 0.76–1.27)
GFR calc Af Amer: 80 mL/min/{1.73_m2} (ref >59)
GFR calc non Af Amer: 69 mL/min/{1.73_m2} (ref >59)
Glucose: 95 mg/dL (ref 65–99)
Potassium: 4.6 mmol/L (ref 3.5–5.2)
Sodium: 140 mmol/L (ref 134–144)

## 2019-02-03 NOTE — Progress Notes (Signed)
Hello Daniel [name],  Your lab result is normal.Some minor variations that are not significant are commonly marked abnormal, but do not represent any medical problem for you.  Best regards, [name], M.D.

## 2019-02-27 ENCOUNTER — Other Ambulatory Visit: Payer: Self-pay | Admitting: Family Medicine

## 2019-03-13 ENCOUNTER — Ambulatory Visit (INDEPENDENT_AMBULATORY_CARE_PROVIDER_SITE_OTHER): Payer: 59 | Admitting: Family Medicine

## 2019-03-13 ENCOUNTER — Other Ambulatory Visit: Payer: Self-pay

## 2019-03-13 DIAGNOSIS — J449 Chronic obstructive pulmonary disease, unspecified: Secondary | ICD-10-CM | POA: Diagnosis not present

## 2019-03-13 DIAGNOSIS — I10 Essential (primary) hypertension: Secondary | ICD-10-CM

## 2019-03-13 DIAGNOSIS — R531 Weakness: Secondary | ICD-10-CM

## 2019-03-13 NOTE — Progress Notes (Signed)
Subjective:    Patient ID: Daniel [name], male    DOB: 1956-01-17, 63 y.o.   MRN: [id number]   HPI: Daniel [name] is a 63 y.o. male presenting for follow-up of hypertension. Patient has no history of headache chest pain or shortness of breath or recent cough. Patient also denies symptoms of TIA such as numbness weakness lateralizing. Patient checks  blood pressure at home and has not had any elevated readings recently. Patient denies side effects from his medication. States taking it regularly.   Patient in for follow-up of elevated cholesterol. Doing well without complaints on current medication. Denies side effects of statin including myalgia and arthralgia and nausea. Also in today for liver function testing. Currently no chest pain, shortness of breath or other cardiovascular related symptoms noted.  Patient is anticoagulated due to history of ischemic stroke.  Denies excessive bleeding or bruising.  Patient also has history of COPD.  Some chronic shortness of breath on exertion.  No rest shortness of breath noted.  He is using his Advair inhaler as directed twice daily.    Depression screen [facility] 2/9 01/18/2019 09/09/2018 08/09/2018 04/12/2018 12/31/2017  Decreased Interest 0 1 0 0 0  Down, Depressed, Hopeless 0 0 0 0 0  PHQ - 2 Score 0 1 0 0 0     Relevant past medical, surgical, family and social history reviewed and updated as indicated.  Interim medical history since our last visit reviewed. Allergies and medications reviewed and updated.  ROS:  Review of Systems   Social History   Tobacco Use  Smoking Status Former Smoker  . Packs/day: 1.00  . Years: 42.00  . Pack years: 42.00  . Types: Cigarettes  Smokeless Tobacco Never Used       Objective:     Wt Readings from Last 3 Encounters:  02/01/19 199 lb (90.3 kg)  01/18/19 197 lb 4 oz (89.5 kg)  12/05/18 191 lb 12.8 oz (87 kg)     Exam deferred. Pt. Harboring due to COVID 19. Phone visit performed.    Assessment & Plan:   1. Essential hypertension   2. Chronic obstructive pulmonary disease, unspecified COPD type ([place])   3. Right sided weakness     Meds ordered this encounter  Medications  . metoprolol succinate (TOPROL-XL) 200 MG 24 hr tablet    Sig: Take 1 tablet (200 mg total) by mouth daily. Take with or immediately following a meal.    Dispense:  30 tablet    Refill:  0    No orders of the defined types were placed in this encounter.     Diagnoses and all orders for this visit:  Essential hypertension  Chronic obstructive pulmonary disease, unspecified COPD type ([place])  Right sided weakness  Other orders -     metoprolol succinate (TOPROL-XL) 200 MG 24 hr tablet; Take 1 tablet (200 mg total) by mouth daily. Take with or immediately following a meal.    Virtual Visit via telephone Note  I discussed the limitations, risks, security and privacy concerns of performing an evaluation and management service by telephone and the availability of in person appointments. The patient was identified with two identifiers. Pt.expressed understanding and agreed to proceed. Pt. Is at home. Dr. Livia Snellen is in his office.  Follow Up Instructions:   I discussed the assessment and treatment plan with the patient. The patient was provided an opportunity to ask questions and all were answered. The patient agreed with the plan and demonstrated an  understanding of the instructions.   The patient was advised to call back or seek an in-person evaluation if the symptoms worsen or if the condition fails to improve as anticipated.   Total minutes including chart review and phone contact time: 25   Follow up plan: Return in about 3 months (around 06/13/2019).  [name], MD [place]

## 2019-03-14 ENCOUNTER — Ambulatory Visit: Payer: 59 | Admitting: Family Medicine

## 2019-03-15 ENCOUNTER — Encounter: Payer: Self-pay | Admitting: Family Medicine

## 2019-03-15 MED ORDER — METOPROLOL SUCCINATE ER 200 MG PO TB24: 200.0000 mg | ORAL_TABLET | Freq: Every day | ORAL | 0 refills | Status: DC

## 2019-03-21 ENCOUNTER — Encounter: Payer: Self-pay | Admitting: Family Medicine

## 2019-03-21 ENCOUNTER — Other Ambulatory Visit: Payer: Self-pay

## 2019-03-21 ENCOUNTER — Ambulatory Visit (INDEPENDENT_AMBULATORY_CARE_PROVIDER_SITE_OTHER): Payer: 59 | Admitting: Family Medicine

## 2019-03-21 DIAGNOSIS — R0609 Other forms of dyspnea: Secondary | ICD-10-CM

## 2019-03-21 DIAGNOSIS — R0600 Dyspnea, unspecified: Secondary | ICD-10-CM

## 2019-03-21 DIAGNOSIS — R001 Bradycardia, unspecified: Secondary | ICD-10-CM | POA: Diagnosis not present

## 2019-03-21 DIAGNOSIS — I10 Essential (primary) hypertension: Secondary | ICD-10-CM | POA: Diagnosis not present

## 2019-03-21 MED ORDER — FLUTICASONE-SALMETEROL 250-50 MCG/DOSE IN AEPB: INHALATION_SPRAY | RESPIRATORY_TRACT | 5 refills | Status: DC

## 2019-03-21 MED ORDER — METOPROLOL SUCCINATE ER 50 MG PO TB24: 50.0000 mg | ORAL_TABLET | Freq: Every day | ORAL | Status: DC

## 2019-03-21 NOTE — Progress Notes (Signed)
Subjective:    Patient ID: Daniel [name], male    DOB: June 20, 1956, 63 y.o.   MRN: [id number]   HPI:  Daniel [name] is a 63 y.o. male presenting for heart rate running low. This morning is 49. As low as 42. BP running 138/83.Feels terrible. No motivation, just laying around. Also short of breath. "Huffing and puffing," frequent stops to catch his breath.Still taking  Metoprolol 100 mg, Didn't increase metoprolol yet. Has been short of breath ever since having his ultrasound several weeks ago. Seems to be worsening.    Depression screen [facility] 2/9 01/18/2019 09/09/2018 08/09/2018 04/12/2018 12/31/2017  Decreased Interest 0 1 0 0 0  Down, Depressed, Hopeless 0 0 0 0 0  PHQ - 2 Score 0 1 0 0 0     Relevant past medical, surgical, family and social history reviewed and updated as indicated.  Interim medical history since our last visit reviewed. Allergies and medications reviewed and updated.  ROS:  Review of Systems  Constitutional: Positive for fatigue. Negative for activity change, appetite change and fever.  Respiratory: Positive for shortness of breath.   Cardiovascular: Negative for chest pain and palpitations.  Musculoskeletal: Negative for arthralgias.  Skin: Negative for rash.     Social History   Tobacco Use  Smoking Status Former Smoker  . Packs/day: 1.00  . Years: 42.00  . Pack years: 42.00  . Types: Cigarettes  Smokeless Tobacco Never Used       Objective:     Wt Readings from Last 3 Encounters:  02/01/19 199 lb (90.3 kg)  01/18/19 197 lb 4 oz (89.5 kg)  12/05/18 191 lb 12.8 oz (87 kg)     Exam deferred. Pt. Harboring due to COVID 19. Phone visit performed.   Assessment & Plan:   1. Dyspnea on exertion   2. Bradycardia   3. Essential hypertension     Meds ordered this encounter  Medications  . metoprolol (TOPROL-XL) 50 MG 24 hr tablet    Sig: Take 1 tablet (50 mg total) by mouth daily. Take with or immediately following a meal.  .  Fluticasone-Salmeterol (ADVAIR DISKUS) 250-50 MCG/DOSE AEPB    Sig: TAKE 1 PUFF BY MOUTH TWICE A DAY    Dispense:  60 each    Refill:  5    No orders of the defined types were placed in this encounter.     Diagnoses and all orders for this visit:  Dyspnea on exertion  Bradycardia  Essential hypertension  Other orders -     metoprolol (TOPROL-XL) 50 MG 24 hr tablet; Take 1 tablet (50 mg total) by mouth daily. Take with or immediately following a meal. -     Fluticasone-Salmeterol (ADVAIR DISKUS) 250-50 MCG/DOSE AEPB; TAKE 1 PUFF BY MOUTH TWICE A DAY    Virtual Visit via telephone Note  I discussed the limitations, risks, security and privacy concerns of performing an evaluation and management service by telephone and the availability of in person appointments. The patient was identified with two identifiers. Pt.expressed understanding and agreed to proceed. Pt. Is at home. Dr. Livia Snellen is in his office.  Follow Up Instructions:   I discussed the assessment and treatment plan with the patient. The patient was provided an opportunity to ask questions and all were answered. The patient agreed with the plan and demonstrated an understanding of the instructions.   The patient was advised to call back or seek an in-person evaluation if the symptoms worsen or if the condition  fails to improve as anticipated.   Total minutes including chart review and phone contact time: 18   Follow up plan: Return in about 1 week (around 03/28/2019).  [name], MD [place]

## 2019-03-24 ENCOUNTER — Other Ambulatory Visit: Payer: Self-pay | Admitting: Adult Health

## 2019-03-24 DIAGNOSIS — M62838 Other muscle spasm: Secondary | ICD-10-CM

## 2019-03-28 ENCOUNTER — Ambulatory Visit (INDEPENDENT_AMBULATORY_CARE_PROVIDER_SITE_OTHER): Payer: 59 | Admitting: Family Medicine

## 2019-03-28 ENCOUNTER — Other Ambulatory Visit: Payer: Self-pay

## 2019-03-28 ENCOUNTER — Encounter: Payer: Self-pay | Admitting: Family Medicine

## 2019-03-28 DIAGNOSIS — J449 Chronic obstructive pulmonary disease, unspecified: Secondary | ICD-10-CM | POA: Diagnosis not present

## 2019-03-28 DIAGNOSIS — W57XXXA Bitten or stung by nonvenomous insect and other nonvenomous arthropods, initial encounter: Secondary | ICD-10-CM

## 2019-03-28 DIAGNOSIS — I150 Renovascular hypertension: Secondary | ICD-10-CM | POA: Diagnosis not present

## 2019-03-28 MED ORDER — DOXYCYCLINE HYCLATE 100 MG PO CAPS: 100.0000 mg | ORAL_CAPSULE | Freq: Two times a day (BID) | ORAL | 0 refills | Status: DC

## 2019-03-28 NOTE — Progress Notes (Addendum)
Subjective:    Patient ID: Daniel [name], male    DOB: 11-11-1955, 63 y.o.   MRN: [id number]   HPI: Daniel [name] is a 63 y.o. male presenting for recheck of bradycardia and BP. BP got better so he went back to full 100 mg Metoprolol. Using Advair regularly now and breathing far better. Improving since last phone encounter 9 days ago.Bradycardia has been asymptomatic, and not as bad. HR staying in 50s to 60s.  134/88, 128/84 Pulse 60s mostly, 55 this AM   Patient reports multiple tick bites related to his work.  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  He has had half a dozen tick bites in the last week multiple locations mostly located on the trunk and legs.  Several of them are getting red and swollen.  He is removed all the ticks however.   Depression screen [facility] 2/9 01/18/2019 09/09/2018 08/09/2018 04/12/2018 12/31/2017  Decreased Interest 0 1 0 0 0  Down, Depressed, Hopeless 0 0 0 0 0  PHQ - 2 Score 0 1 0 0 0     Relevant past medical, surgical, family and social history reviewed and updated as indicated.  Interim medical history since our last visit reviewed. Allergies and medications reviewed and updated.  ROS:  Review of Systems  Constitutional: Negative for fever.  Respiratory: Negative for shortness of breath.   Cardiovascular: Negative for chest pain.  Musculoskeletal: Negative for arthralgias.  Skin: Negative for rash.     Social History   Tobacco Use  Smoking Status Former Smoker  . Packs/day: 1.00  . Years: 42.00  . Pack years: 42.00  . Types: Cigarettes  Smokeless Tobacco Never Used       Objective:     Wt Readings from Last 3 Encounters:  02/01/19 199 lb (90.3 kg)  01/18/19 197 lb 4 oz (89.5 kg)  12/05/18 191 lb 12.8 oz (87 kg)     Exam deferred. Pt. Harboring due to COVID 19. Phone visit performed.   Assessment & Plan:   1. Tick bite, initial encounter   2. Renovascular hypertension   3. Chronic  obstructive pulmonary disease, unspecified COPD type ([place])     Meds ordered this encounter  Medications  . doxycycline (VIBRAMYCIN) 100 MG capsule    Sig: Take 1 capsule (100 mg total) by mouth 2 (two) times daily.    Dispense:  20 capsule    Refill:  0    No orders of the defined types were placed in this encounter.     Diagnoses and all orders for this visit:  Tick bite, initial encounter  Renovascular hypertension  Chronic obstructive pulmonary disease, unspecified COPD type ([place])  Other orders -     doxycycline (VIBRAMYCIN) 100 MG capsule; Take 1 capsule (100 mg total) by mouth 2 (two) times daily.    Virtual Visit via telephone Note  I discussed the limitations, risks, security and privacy concerns of performing an evaluation and management service by telephone and the availability of in person appointments. The patient was identified with two identifiers. Pt.expressed understanding and agreed to proceed. Pt. Is at home. Dr. Livia Snellen is in his office.  Follow Up Instructions:   I discussed the assessment and treatment plan with the patient. The patient was provided an opportunity to ask questions and all were answered. The patient agreed with the plan and demonstrated an understanding of the instructions.   The patient was advised to call back or  seek an in-person evaluation if the symptoms worsen or if the condition fails to improve as anticipated.   Total minutes including chart review and phone contact time: 18   Follow up plan: Return in about 6 months (around 09/28/2019), or if symptoms worsen or fail to improve.  [name], MD [place]

## 2019-04-25 ENCOUNTER — Other Ambulatory Visit: Payer: Self-pay

## 2019-04-26 ENCOUNTER — Encounter: Payer: Self-pay | Admitting: Family Medicine

## 2019-04-26 ENCOUNTER — Ambulatory Visit: Payer: 59 | Admitting: Family Medicine

## 2019-04-26 VITALS — BP 137/90 | HR 68 | Temp 98.1°F | Ht 70.0 in | Wt 203.0 lb

## 2019-04-26 DIAGNOSIS — R5383 Other fatigue: Secondary | ICD-10-CM

## 2019-04-26 DIAGNOSIS — I10 Essential (primary) hypertension: Secondary | ICD-10-CM | POA: Diagnosis not present

## 2019-04-26 DIAGNOSIS — J449 Chronic obstructive pulmonary disease, unspecified: Secondary | ICD-10-CM | POA: Diagnosis not present

## 2019-04-26 LAB — LIPID PANEL

## 2019-04-26 MED ORDER — PREDNISONE 10 MG PO TABS: ORAL_TABLET | ORAL | 0 refills | Status: DC

## 2019-04-26 MED ORDER — TRELEGY ELLIPTA 100-62.5-25 MCG/INH IN AEPB: 1.0000 | INHALATION_SPRAY | Freq: Every day | RESPIRATORY_TRACT | 11 refills | Status: DC

## 2019-04-27 LAB — CBC WITH DIFFERENTIAL/PLATELET
Basophils Absolute: 0 10*3/uL (ref 0.0–0.2)
Basos: 0 %
EOS (ABSOLUTE): 0.1 10*3/uL (ref 0.0–0.4)
Eos: 2 %
Hematocrit: 45.4 % (ref 37.5–51.0)
Hemoglobin: 15.8 g/dL (ref 13.0–17.7)
Immature Grans (Abs): 0 10*3/uL (ref 0.0–0.1)
Immature Granulocytes: 0 %
Lymphocytes Absolute: 1.3 10*3/uL (ref 0.7–3.1)
Lymphs: 19 %
MCH: 33 pg (ref 26.6–33.0)
MCHC: 34.8 g/dL (ref 31.5–35.7)
MCV: 95 fL (ref 79–97)
Monocytes Absolute: 0.5 10*3/uL (ref 0.1–0.9)
Monocytes: 7 %
Neutrophils Absolute: 4.8 10*3/uL (ref 1.4–7.0)
Neutrophils: 72 %
Platelets: 174 10*3/uL (ref 150–450)
RBC: 4.79 x10E6/uL (ref 4.14–5.80)
RDW: 13.3 % (ref 11.6–15.4)
WBC: 6.7 10*3/uL (ref 3.4–10.8)

## 2019-04-27 LAB — LIPID PANEL
Chol/HDL Ratio: 2.3 ratio (ref 0.0–5.0)
Cholesterol, Total: 142 mg/dL (ref 100–199)
HDL: 62 mg/dL (ref >39)
LDL Calculated: 67 mg/dL (ref 0–99)
Triglycerides: 65 mg/dL (ref 0–149)
VLDL Cholesterol Cal: 13 mg/dL (ref 5–40)

## 2019-04-27 LAB — CMP14+EGFR
ALT: 35 IU/L (ref 0–44)
AST: 34 IU/L (ref 0–40)
Albumin/Globulin Ratio: 2.6 — ABNORMAL HIGH (ref 1.2–2.2)
Albumin: 4.9 g/dL — ABNORMAL HIGH (ref 3.8–4.8)
Alkaline Phosphatase: 81 IU/L (ref 39–117)
BUN/Creatinine Ratio: 11 (ref 10–24)
BUN: 13 mg/dL (ref 8–27)
Bilirubin Total: 1.4 mg/dL — ABNORMAL HIGH (ref 0.0–1.2)
CO2: 21 mmol/L (ref 20–29)
Calcium: 9.7 mg/dL (ref 8.6–10.2)
Chloride: 103 mmol/L (ref 96–106)
Creatinine, Ser: 1.17 mg/dL (ref 0.76–1.27)
GFR calc Af Amer: 76 mL/min/{1.73_m2} (ref >59)
GFR calc non Af Amer: 66 mL/min/{1.73_m2} (ref >59)
Globulin, Total: 1.9 g/dL (ref 1.5–4.5)
Glucose: 109 mg/dL — ABNORMAL HIGH (ref 65–99)
Potassium: 4.9 mmol/L (ref 3.5–5.2)
Sodium: 140 mmol/L (ref 134–144)
Total Protein: 6.8 g/dL (ref 6.0–8.5)

## 2019-04-27 LAB — LYME AB/WESTERN BLOT REFLEX
LYME DISEASE AB, QUANT, IGM: <0.8 index (ref 0.00–0.79)
Lyme IgG/IgM Ab: <0.91 {ISR} (ref 0.00–0.90)

## 2019-04-27 LAB — TESTOSTERONE,FREE AND TOTAL
Testosterone, Free: 10.5 pg/mL (ref 6.6–18.1)
Testosterone: 642 ng/dL (ref 264–916)

## 2019-04-27 LAB — THYROID PANEL WITH TSH
Free Thyroxine Index: 1.8 (ref 1.2–4.9)
T3 Uptake Ratio: 27 % (ref 24–39)
T4, Total: 6.5 ug/dL (ref 4.5–12.0)
TSH: 3.11 u[IU]/mL (ref 0.450–4.500)

## 2019-05-07 ENCOUNTER — Encounter: Payer: Self-pay | Admitting: Family Medicine

## 2019-05-07 NOTE — Progress Notes (Signed)
Subjective:  Patient ID: Daniel [name], male    DOB: 1956/10/24  Age: 63 y.o. MRN: [id number]  CC: Recheck BP and pulse   HPI Daniel [name] presents for continuing fatigue. HE feels tired and weak all the time. No relation to time of day. Exertion  Is exhausting. He gets easily winded. He is a previous smoker. Follow-up of hypertension. Pt. Has known renal artery stenosis, but has been controlled so far with medication. Patient has no history of headache chest pain or shortness of breath or recent cough. Patient also denies symptoms of TIA such as numbness weakness lateralizing. Patient checks  blood pressure at home and has not had any elevated readings recently. Patient denies side effects from his medication. States taking it regularly.   Depression screen [facility] 2/9 04/26/2019 01/18/2019 09/09/2018  Decreased Interest 0 0 1  Down, Depressed, Hopeless 0 0 0  PHQ - 2 Score 0 0 1    History Daniel [name] has a past medical history of BPH (benign prostatic hyperplasia), COPD (chronic obstructive pulmonary disease) ([place]), Hyperlipidemia, Hypertension, Pulmonary nodule, renal stents, and Stroke [facility])    He has a past surgical history that includes No past surgeries; renal stents; and Cervical fusion.   His family history includes Cancer in his father; Emphysema in his father; Lung cancer in his mother.He reports that he has quit smoking. His smoking use included cigarettes. He has a 42.00 pack-year smoking history. He has never used smokeless tobacco. He reports that he does not drink alcohol or use drugs.    ROS Review of Systems  Constitutional: Positive for fatigue. Negative for fever.  HENT: Negative.   Eyes: Negative for visual disturbance.  Respiratory: Positive for shortness of breath. Negative for cough.   Cardiovascular: Negative for chest pain and leg swelling.  Gastrointestinal: Negative for abdominal pain, diarrhea, nausea and vomiting.  Genitourinary: Negative for  difficulty urinating.  Musculoskeletal: Negative for arthralgias and myalgias.  Skin: Negative for rash.  Neurological: Negative for headaches.  Psychiatric/Behavioral: Negative for sleep disturbance.    Objective:  BP 137/90   Pulse 68   Temp 98.1 F (36.7 C) (Oral)   Ht 5' 10"  (1.778 m)   Wt 203 lb (92.1 kg)   BMI 29.13 kg/m   BP Readings from Last 3 Encounters:  04/26/19 137/90  02/01/19 (!) 155/75  01/18/19 (!) 152/93    Wt Readings from Last 3 Encounters:  04/26/19 203 lb (92.1 kg)  02/01/19 199 lb (90.3 kg)  01/18/19 197 lb 4 oz (89.5 kg)     Physical Exam Constitutional:      General: He is not in acute distress.    Appearance: He is well-developed.  HENT:     Head: Normocephalic and atraumatic.     Right Ear: External ear normal.     Left Ear: External ear normal.     Nose: Nose normal.  Eyes:     Conjunctiva/sclera: Conjunctivae normal.     Pupils: Pupils are equal, round, and reactive to light.  Neck:     Musculoskeletal: Normal range of motion and neck supple.  Cardiovascular:     Rate and Rhythm: Normal rate and regular rhythm.     Heart sounds: Normal heart sounds. No murmur.  Pulmonary:     Effort: Pulmonary effort is normal. No respiratory distress.     Breath sounds: Normal breath sounds. No wheezing or rales.  Abdominal:     Palpations: Abdomen is soft.     Tenderness: There is  no abdominal tenderness.  Musculoskeletal: Normal range of motion.  Skin:    General: Skin is warm and dry.  Neurological:     Mental Status: He is alert and oriented to person, place, and time.     Deep Tendon Reflexes: Reflexes are normal and symmetric.  Psychiatric:        Behavior: Behavior normal.        Thought Content: Thought content normal.        Judgment: Judgment normal.       Assessment & Plan:   Gregory was seen today for recheck bp and pulse.  Diagnoses and all orders for this visit:  Chronic obstructive pulmonary disease, unspecified  COPD type (HCC)  Fatigue, unspecified type -     CBC with Differential/Platelet -     CMP14+EGFR -     Lipid panel -     Thyroid Panel With TSH -     Testosterone,Free and Total -     Lyme Ab/Western Blot Reflex  Essential hypertension  Other orders -     Fluticasone-Umeclidin-Vilant (TRELEGY ELLIPTA) 100-62.5-25 MCG/INH AEPB; Inhale 1 puff into the lungs daily. -     predniSONE (DELTASONE) 10 MG tablet; Take 5 daily for 2 days followed by 4,3,2 and 1 for 2 days each.       I have discontinued Daniel Mccalls doxycycline. I am also having him start on Trelegy Ellipta and predniSONE. Additionally, I am having him maintain his folic acid, thiamine, docusate sodium, cyclobenzaprine, acetaminophen, albuterol, pramipexole, atorvastatin, clopidogrel, metoprolol succinate, Fluticasone-Salmeterol, and Baclofen.  Allergies as of 04/26/2019   No Known Allergies     Medication List       Accurate as of April 26, 2019 11:59 PM. If you have any questions, ask your nurse or doctor.        STOP taking these medications   doxycycline 100 MG capsule Commonly known as: Vibramycin Stopped by: [name], MD     TAKE these medications   acetaminophen 500 MG tablet Commonly known as: TYLENOL Take 500 mg by mouth every 6 (six) hours as needed for mild pain or moderate pain.   atorvastatin 80 MG tablet Commonly known as: LIPITOR TAKE 1 TABLET(80 MG) BY MOUTH DAILY AT 6 PM   Baclofen 5 MG Tabs TAKE 1 TABLET BY MOUTH THREE TIMES DAILY   clopidogrel 75 MG tablet Commonly known as: PLAVIX TAKE 1 TABLET(75 MG) BY MOUTH DAILY   cyclobenzaprine 5 MG tablet Commonly known as: FLEXERIL Take 5 mg by mouth at bedtime as needed for muscle spasms.   docusate sodium 100 MG capsule Commonly known as: COLACE Take 100 mg by mouth 2 (two) times daily.   Fluticasone-Salmeterol 250-50 MCG/DOSE Aepb Commonly known as: Advair Diskus TAKE 1 PUFF BY MOUTH TWICE A DAY   folic acid 1  MG tablet Commonly known as: FOLVITE Take 1 tablet (1 mg total) by mouth daily.   metoprolol succinate 50 MG 24 hr tablet Commonly known as: TOPROL-XL Take 1 tablet (50 mg total) by mouth daily. Take with or immediately following a meal.   pramipexole 0.125 MG tablet Commonly known as: MIRAPEX TAKE 1 TABLET(0.125 MG) BY MOUTH AT BEDTIME   predniSONE 10 MG tablet Commonly known as: DELTASONE Take 5 daily for 2 days followed by 4,3,2 and 1 for 2 days each. Started by: [name], MD   Proventil HFA 108 [phone] Base) MCG/ACT inhaler Generic drug: albuterol Inhale into the lungs.   thiamine 100 MG  tablet Take 1 tablet (100 mg total) by mouth daily.   Trelegy Ellipta 100-62.5-25 MCG/INH Aepb Generic drug: Fluticasone-Umeclidin-Vilant Inhale 1 puff into the lungs daily. Started by: [name], MD        Follow-up: Return in about 6 weeks (around 06/07/2019).  [name], M.D.

## 2019-06-05 ENCOUNTER — Ambulatory Visit: Payer: 59 | Admitting: Adult Health

## 2019-06-05 ENCOUNTER — Encounter: Payer: Self-pay | Admitting: Adult Health

## 2019-06-05 ENCOUNTER — Other Ambulatory Visit: Payer: Self-pay

## 2019-06-05 VITALS — BP 133/77 | HR 63 | Temp 98.0°F | Ht 70.5 in | Wt 203.6 lb

## 2019-06-05 DIAGNOSIS — I639 Cerebral infarction, unspecified: Secondary | ICD-10-CM | POA: Diagnosis not present

## 2019-06-05 DIAGNOSIS — M62838 Other muscle spasm: Secondary | ICD-10-CM

## 2019-06-05 DIAGNOSIS — E785 Hyperlipidemia, unspecified: Secondary | ICD-10-CM | POA: Diagnosis not present

## 2019-06-05 DIAGNOSIS — I10 Essential (primary) hypertension: Secondary | ICD-10-CM | POA: Diagnosis not present

## 2019-06-05 NOTE — Patient Instructions (Signed)
Continue clopidogrel 75 mg daily  and atorvastatin for secondary stroke prevention  Continue to follow up with PCP regarding cholesterol and blood pressure management   Continue to monitor blood pressure at home  Maintain strict control of hypertension with blood pressure goal below 130/90, diabetes with hemoglobin A1c goal below 6.5% and cholesterol with LDL cholesterol (bad cholesterol) goal below 70 mg/dL. I also advised the patient to eat a healthy diet with plenty of whole grains, cereals, fruits and vegetables, exercise regularly and maintain ideal body weight.  You have recovered well from a stroke standpoint and recommend following up as needed but to call the office with any questions or concerns regarding stroke       Thank you for coming to see [place] at [facility]. I hope we have been able to provide you high quality care today.  You may receive a patient satisfaction survey over the next few weeks. We would appreciate your feedback and comments so that we may continue to improve ourselves and the health of our patients.

## 2019-06-05 NOTE — Progress Notes (Signed)
[facility] [address] [place]. [place] [address] [phone]       OFFICE FOLLOW UP NOTE  [name] Date of Birth:  1956-05-17 Medical Record Number:  [id number]   Reason for Referral:  hospital stroke follow up  CHIEF COMPLAINT:  Chief Complaint  Patient presents with   Follow-up    6 mon f/u. Alone. Rm 9. No new concerns at this time.     HPI:  06/05/2019 update: Daniel [name] is a 63 year old male who is being seen today for stroke follow-up.  He has been stable from a stroke standpoint.  He continues on baclofen with continued benefit of muscle spasms.  Continues on clopidogrel and atorvastatin for secondary stroke prevention without reported side effects.  Blood pressure stable 133/77.  Denies new or worsening stroke/TIA symptoms.   12/05/2018 update: Daniel [name] is being seen today for stroke follow up visit.  Overall, patient has been stable from a stroke standpoint.  He continues on Plavix without side effects of bleeding or bruising.  Continues on atorvastatin without side effects myalgias.  He did have lipid panel obtained on 09/10/2018 which showed LDL 42.  Blood pressure today 134/74.  He continues on baclofen 5 mg 3 times daily for lower extremity spasms with benefit.  He occasionally will use Flexeril 5 mg for breakthrough spasms.  He does have concerns today of weight gain and is questioning whether a specific medication could be doing this.  Denies new or worsening stroke/TIA symptoms.  06/01/18 UPDATE: Patient is being seen today for routine follow-up appointment.  Patient did undergo EMG/nerve conduction study but only showed very mild bilateral lower extremity neuropathy.  Patient continues to take baclofen twice daily which has greatly reduced his muscle spasms.  He states on occasion he may have increase spasms during the day in between doses but overall feels as though these have greatly improved.  He has continued to tolerate  baclofen well without side effects.  Does have complaints of continued dizziness for which she has had for the past 5 years that occurs with rapid movement or position changes and lasts only seconds.  Patient was provided with BPPV home exercises as he is declining PT at this time.  Continues to take Plavix without side effects of bleeding or bruising.  Continues to take Lipitor without side effects myalgias.  Blood pressure today satisfactory 133/74.  Denies new or worsening stroke/TIA symptoms.   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 History obtained from patient, wife and chart review. Reviewed all radiology images and labs personally.  Daniel [name] is a 63 y.o. male with history of pulmonary nodule, ongoing tobacco use, ETOH use, hypertension, hyperlipidemia, COPD, CKD with likely renal artery stenosis presenting with intermittent right-sided weakness. He did not receive IV t-PA due to resolution of deficits and late presentation. However, at lunch time, his right sided weakness returned. CT head reviewed and showed chronic stable mild to moderate small vessel ischemic disease with no significant change from prior imaging.  Initial MRI showed no acute intercranial abnormality.  Repeat MRI did show acute/early subacute infarctions within the left posterior limb of internal capsule and right posteromedial thalamus.  Carotid Dopplers are unremarkable.  2D echo showed an EF of 60 to 65%.  Patient was not on antithrombotic prior to admission but due to concerns of capsular warning syndrome, he was loaded with aspirin  and Plavix along with Lipitor 80 mg and recommended to continue DAPT for 3 weeks and then Plavix alone.  LDL 134 and recommended to continue Lipitor 80 mg at discharge.  Therapies recommended CIR placement.   Patient is being seen today for hospital follow-up visit and is accompanied by his  wife.  All symptoms have resolved and he is back to doing all normal activities.  He does complain of daily headaches since his stroke.  Denies phonophobia or nausea vomiting but does endorse photophobia.  He has been taking extra strength Tylenol which helps relieve these headaches.  They are not debilitating as he can continue to do certain activities but also states he has not been as active due to recent renal stent placement.  Patient also complains of muscle cramps that he states is been occurring for the past 5 years where his muscles will contract and become very painfu.  This typically happens when patient has increased fatigue or with increased activity.  They are also worse in the evening or with cold temperature.  He has recently been taking Flexeril without much relief.  Patient states he has had multiple blood tests in the past by primary providers but all negative.  Continues to take both aspirin and Plavix without side effects of bleeding or bruising.  Continues to take Lipitor without increased side effects of myalgias.  Blood pressure today at beginning of visit 153/93 and at recheck at end of visit 102/60.  States he has not smoked cigarettes since hospital discharge.  Denies new or worsening stroke/TIA symptoms.       ROS:   14 system review of systems performed and negative with exception of no complaints  PMH:  Past Medical History:  Diagnosis Date   BPH (benign prostatic hyperplasia)    COPD (chronic obstructive pulmonary disease) (HCC)    Hyperlipidemia    Hypertension    Pulmonary nodule    renal stents    Stroke (HCC)     PSH:  Past Surgical History:  Procedure Laterality Date   CERVICAL FUSION     NO PAST SURGERIES     renal stents      Social History:  Social History   Socioeconomic History   Marital status: Married    Spouse name: Not on file   Number of children: Not on file   Years of education: Not on file   Highest education level:  Not on file  Occupational History   Occupation: Product manager strain: Not on file   Food insecurity    Worry: Not on file    Inability: Not on file   Transportation needs    Medical: Not on file    Non-medical: Not on file  Tobacco Use   Smoking status: Former Smoker    Packs/day: 1.00    Years: 42.00    Pack years: 42.00    Types: Cigarettes   Smokeless tobacco: Never Used  Substance and Sexual Activity   Alcohol use: Never    Frequency: Never    Comment: not dirnking now   Drug use: Never   Sexual activity: Not on file  Lifestyle   Physical activity    Days per week: Not on file    Minutes per session: Not on file   Stress: Not on file  Relationships   Social connections    Talks on phone: Not on file    Gets together: Not on file  Attends religious service: Not on file    Active member of club or organization: Not on file    Attends meetings of clubs or organizations: Not on file    Relationship status: Not on file   Intimate partner violence    Fear of current or ex partner: Not on file    Emotionally abused: Not on file    Physically abused: Not on file    Forced sexual activity: Not on file  Other Topics Concern   Not on file  Social History Narrative   Not on file    Family History:  Family History  Problem Relation Age of Onset   Emphysema Father    Cancer Father    Lung cancer Mother     Medications:   Current Outpatient Medications on File Prior to Visit  Medication Sig Dispense Refill   atorvastatin (LIPITOR) 80 MG tablet TAKE 1 TABLET(80 MG) BY MOUTH DAILY AT 6 PM 90 tablet 1   Baclofen 5 MG TABS TAKE 1 TABLET BY MOUTH THREE TIMES DAILY (Patient taking differently: Take 10 mg by mouth 2 (two) times daily. ) 180 tablet 4   clopidogrel (PLAVIX) 75 MG tablet TAKE 1 TABLET(75 MG) BY MOUTH DAILY 90 tablet 1   cyclobenzaprine (FLEXERIL) 5 MG tablet Take 5 mg by mouth at bedtime as needed for  muscle spasms.      Fluticasone-Salmeterol (ADVAIR DISKUS) 250-50 MCG/DOSE AEPB TAKE 1 PUFF BY MOUTH TWICE A DAY 60 each 5   folic acid (FOLVITE) 1 MG tablet Take 1 tablet (1 mg total) by mouth daily. 30 tablet 0   metoprolol (TOPROL-XL) 50 MG 24 hr tablet Take 1 tablet (50 mg total) by mouth daily. Take with or immediately following a meal. (Patient taking differently: Take 25 mg by mouth daily. Take with or immediately following a meal.)     thiamine 100 MG tablet Take 1 tablet (100 mg total) by mouth daily. 30 tablet 0   albuterol (PROVENTIL HFA) 108 (90 Base) MCG/ACT inhaler Inhale into the lungs.     docusate sodium (COLACE) 100 MG capsule Take 100 mg by mouth 2 (two) times daily.     Fluticasone-Umeclidin-Vilant (TRELEGY ELLIPTA) 100-62.5-25 MCG/INH AEPB Inhale 1 puff into the lungs daily. (Patient not taking: Reported on 06/05/2019) 28 each 11   pramipexole (MIRAPEX) 0.125 MG tablet TAKE 1 TABLET(0.125 MG) BY MOUTH AT BEDTIME (Patient not taking: Reported on 06/05/2019) 90 tablet 0   No current facility-administered medications on file prior to visit.     Allergies:  No Known Allergies   Physical Exam  Vitals:   06/05/19 1538  BP: 133/77  Pulse: 63  Temp: 98 F (36.7 C)  TempSrc: Oral  Weight: 203 lb 9.6 oz (92.4 kg)  Height: 5' 10.5" (1.791 m)   Body mass index is 28.8 kg/m. No exam data present  General: Pleasant middle-aged Caucasian male, seated, in no evident distress Head: head normocephalic and atraumatic.   Neck: supple with no carotid or supraclavicular bruits Cardiovascular: regular rate and rhythm, no murmurs Musculoskeletal: no deformity Skin:  no rash/petichiae Vascular:  Normal pulses all extremities  Neurologic Exam Mental Status: Awake and fully alert. Oriented to place and time. Recent and remote memory intact. Attention span, concentration and fund of knowledge appropriate. Mood and affect appropriate.  Cranial Nerves: Pupils equal, briskly  reactive to light. Extraocular movements full without nystagmus. Visual fields full to confrontation. Hearing intact. Facial sensation intact. Face, tongue, palate moves normally and symmetrically.  Motor: Normal bulk and tone. Normal strength in all tested extremity muscles. Sensory.: intact to touch , pinprick , position and vibratory sensation.  Coordination: Rapid alternating movements normal in all extremities. Finger-to-nose and heel-to-shin performed accurately bilaterally. Gait and Station: Arises from chair without difficulty. Stance is normal. Gait demonstrates normal stride length and balance . Able to heel, toe and tandem walk without difficulty.  Reflexes: 1+ and symmetric. Toes downgoing.       Diagnostic Data (Labs, Imaging, Testing)  Ct Head Wo Contrast 03/10/2018 IMPRESSION:  Chronic stable mild-to-moderate small vessel ischemic disease. No significant change from prior. No acute intracranial abnormality is identified.   MRI HEAD 03/11/2018 1. No acute intracranial abnormality.  2. Mild to moderate chronic small vessel ischemic change.   MRI HEAD (REPEAT) 03/12/2018 IMPRESSION: 1. 6 mm acute/early subacute infarctions within left posterior limb of internal capsule and right posteromedial thalamus. No hemorrhage or mass effect. 2. Stable mild-to-moderate chronic microvascular ischemic changes and parenchymal volume loss of the brain.  MRA HEAD IMPRESSION  1. Negative intracranial MRA for large vessel occlusion.  2. Mild atherosclerotic change for age without hemodynamically significant or correctable stenosis.   Transthoracic Echocardiogram  - LVEF 60-65%, normal wall thickness, normal wall motion, normal diastolic function, trivial MR, moderate biatrial enlargement, trivial TR, RVSP 40 mmHg, dilated IVC.  Bilateral Carotid Dopplers  Findings suggest 1-39% internal carotid artery stenosis bilaterally. Vertebral arteries are patent with antegrade  flow.   ASSESSMENT: Bassem Bernasconi is a 63 y.o. year old male here with left internal capsule and right thalamus on 03/10/2018 secondary to small vessel disease. Vascular risk factors include HTN, HLD and smoking.  Stable from stroke standpoint.    PLAN: -Continue clopidogrel 75 mg daily and Lipitor for secondary stroke prevention -Continue baclofen 10 mg 2 times a day for muscle spasms -recently refilled by PCP -Advised to continue to stay active and maintain a healthy diet -F/u with PCP regarding your HLD and HTN management -continue to monitor BP at home -Maintain strict control of hypertension with blood pressure goal below 130/90, diabetes with hemoglobin A1c goal below 6.5% and cholesterol with LDL cholesterol (bad cholesterol) goal below 70 mg/dL. I also advised the patient to eat a healthy diet with plenty of whole grains, cereals, fruits and vegetables, exercise regularly and maintain ideal body weight.   Stable from stroke standpoint recommend follow-up as needed  Greater than 50% of time during this 25 minute visit was spent on counseling,explanation of diagnosis of left internal capsule and right thalamus, reviewing risk factor management of HTN and HLD, planning of further management, discussion with patient and family and coordination of care    Venancio Poisson, [facility]  [facility] [address] [place], [place] [address]  Phone [phone] Fax [phone]

## 2019-06-06 ENCOUNTER — Telehealth: Payer: Self-pay | Admitting: Family Medicine

## 2019-06-06 NOTE — Telephone Encounter (Signed)
Spouse call [facility] and they say that Fluticasone-Umeclidin-Vilant (TRELEGY ELLIPTA) 100-62.5-25 MCG/INH AEPB is a rx but in system it says OTC please call to clarify.

## 2019-06-06 NOTE — Telephone Encounter (Signed)
Is the Trellegy replacing the Advair or is he to take both?

## 2019-06-06 NOTE — Telephone Encounter (Signed)
Send in script for one puff daily. It is not available OTC

## 2019-06-06 NOTE — Telephone Encounter (Signed)
Trelegy replaces advair.

## 2019-06-07 ENCOUNTER — Telehealth: Payer: Self-pay | Admitting: *Deleted

## 2019-06-07 MED ORDER — TRELEGY ELLIPTA 100-62.5-25 MCG/INH IN AEPB: 1.0000 | INHALATION_SPRAY | Freq: Every day | RESPIRATORY_TRACT | 11 refills | Status: DC

## 2019-06-07 NOTE — Telephone Encounter (Signed)
Wife aware

## 2019-06-07 NOTE — Telephone Encounter (Signed)
Message left on wifes phone

## 2019-06-07 NOTE — Telephone Encounter (Signed)
Trelegy sent in, previous Rx did not go through

## 2019-06-12 NOTE — Progress Notes (Signed)
I agree with the above plan 

## 2019-06-13 ENCOUNTER — Other Ambulatory Visit: Payer: Self-pay | Admitting: Family Medicine

## 2019-07-28 ENCOUNTER — Other Ambulatory Visit: Payer: Self-pay

## 2019-07-31 ENCOUNTER — Other Ambulatory Visit: Payer: Self-pay

## 2019-07-31 ENCOUNTER — Ambulatory Visit (INDEPENDENT_AMBULATORY_CARE_PROVIDER_SITE_OTHER): Payer: 59 | Admitting: Family Medicine

## 2019-07-31 ENCOUNTER — Encounter: Payer: Self-pay | Admitting: Family Medicine

## 2019-07-31 VITALS — BP 118/74 | HR 72 | Temp 97.7°F | Resp 20 | Ht 70.5 in | Wt 205.0 lb

## 2019-07-31 DIAGNOSIS — I10 Essential (primary) hypertension: Secondary | ICD-10-CM | POA: Diagnosis not present

## 2019-07-31 DIAGNOSIS — Z0000 Encounter for general adult medical examination without abnormal findings: Secondary | ICD-10-CM

## 2019-07-31 DIAGNOSIS — Z0001 Encounter for general adult medical examination with abnormal findings: Secondary | ICD-10-CM | POA: Diagnosis not present

## 2019-07-31 DIAGNOSIS — Z23 Encounter for immunization: Secondary | ICD-10-CM

## 2019-07-31 NOTE — Progress Notes (Signed)
Subjective:  Patient ID: Daniel [name], male    DOB: 19-Mar-1956  Age: 63 y.o. MRN: [id number]  CC: Annual Exam and Hypertension   HPI Daniel [name] presents for CPE  Depression screen [facility] 2/9 07/31/2019 04/26/2019 01/18/2019  Decreased Interest 0 0 0  Down, Depressed, Hopeless 0 0 0  PHQ - 2 Score 0 0 0    History Daniel [name] has a past medical history of BPH (benign prostatic hyperplasia), COPD (chronic obstructive pulmonary disease) ([place]), Hyperlipidemia, Hypertension, Pulmonary nodule, renal stents, and Stroke ([place]).   He has a past surgical history that includes No past surgeries; renal stents; and Cervical fusion.   His family history includes Cancer in his father; Emphysema in his father; Lung cancer in his mother.He reports that he has quit smoking. His smoking use included cigarettes. He has a 42.00 pack-year smoking history. He has never used smokeless tobacco. He reports that he does not drink alcohol or use drugs.    ROS Review of Systems  Constitutional: Negative for activity change, fatigue and unexpected weight change.  HENT: Negative for congestion, ear pain, hearing loss, postnasal drip and trouble swallowing.   Eyes: Negative for pain and visual disturbance.  Respiratory: Negative for cough and chest tightness.   Cardiovascular: Negative for chest pain, palpitations and leg swelling.  Gastrointestinal: Negative for abdominal distention, abdominal pain, blood in stool, constipation, diarrhea, nausea and vomiting.  Endocrine: Negative for cold intolerance, heat intolerance and polydipsia.  Genitourinary: Negative for difficulty urinating, dysuria, flank pain, frequency and urgency.  Musculoskeletal: Negative for arthralgias and joint swelling.  Skin: Negative for color change, rash and wound.  Neurological: Negative for dizziness, syncope, speech difficulty, weakness, light-headedness, numbness and headaches.  Hematological: Does not bruise/bleed easily.   Psychiatric/Behavioral: Negative for confusion, decreased concentration, dysphoric mood and sleep disturbance. The patient is not nervous/anxious.     Objective:  BP 118/74   Pulse 72   Temp 97.7 F (36.5 C)   Resp 20   Ht 5' 10.5" (1.791 m)   Wt 205 lb (93 kg)   SpO2 98%   BMI 29.00 kg/m   BP Readings from Last 3 Encounters:  07/31/19 118/74  06/05/19 133/77  04/26/19 137/90    Wt Readings from Last 3 Encounters:  07/31/19 205 lb (93 kg)  06/05/19 203 lb 9.6 oz (92.4 kg)  04/26/19 203 lb (92.1 kg)     Physical Exam Constitutional:      Appearance: He is well-developed.  HENT:     Head: Normocephalic and atraumatic.  Eyes:     Pupils: Pupils are equal, round, and reactive to light.  Neck:     Musculoskeletal: Normal range of motion.     Thyroid: No thyromegaly.     Trachea: No tracheal deviation.  Cardiovascular:     Rate and Rhythm: Normal rate and regular rhythm.     Heart sounds: Normal heart sounds. No murmur. No friction rub. No gallop.   Pulmonary:     Breath sounds: Normal breath sounds. No wheezing or rales.  Abdominal:     General: Bowel sounds are normal. There is no distension.     Palpations: Abdomen is soft. There is no mass.     Tenderness: There is no abdominal tenderness.     Hernia: There is no hernia in the left inguinal area.  Genitourinary:    Penis: Normal.      Scrotum/Testes: Normal.  Musculoskeletal: Normal range of motion.  Lymphadenopathy:     Cervical: No  cervical adenopathy.  Skin:    General: Skin is warm and dry.  Neurological:     Mental Status: He is alert and oriented to person, place, and time.       Assessment & Plan:   Daniel [name] was seen today for annual exam and hypertension.  Diagnoses and all orders for this visit:  Well adult exam -     CBC with Differential/Platelet -     CMP14+EGFR -     PSA Total (Reflex To Free) -     TSH -     VITAMIN D 25 Hydroxy (Vit-D Deficiency, Fractures) -     Urinalysis -      Lipid panel  Essential hypertension -     CBC with Differential/Platelet -     CMP14+EGFR -     PSA Total (Reflex To Free) -     TSH -     VITAMIN D 25 Hydroxy (Vit-D Deficiency, Fractures) -     Urinalysis -     Lipid panel  Other orders -     Pneumococcal conjugate vaccine 13-valent       I have discontinued Carola Rhine "[name]"'s docusate sodium, albuterol, and pramipexole. I am also having him maintain his folic acid, thiamine, cyclobenzaprine, atorvastatin, clopidogrel, Fluticasone-Salmeterol, Baclofen, Trelegy Ellipta, and metoprolol succinate.  Allergies as of 07/31/2019   No Known Allergies     Medication List       Accurate as of July 31, 2019  6:20 PM. If you have any questions, ask your nurse or doctor.        STOP taking these medications   docusate sodium 100 MG capsule Commonly known as: COLACE Stopped by: [name], MD   pramipexole 0.125 MG tablet Commonly known as: MIRAPEX Stopped by: [name], MD   Proventil HFA 108 [phone] Base) MCG/ACT inhaler Generic drug: albuterol Stopped by: [name], MD     TAKE these medications   atorvastatin 80 MG tablet Commonly known as: LIPITOR TAKE 1 TABLET(80 MG) BY MOUTH DAILY AT 6 PM   Baclofen 5 MG Tabs TAKE 1 TABLET BY MOUTH THREE TIMES DAILY What changed:   how much to take  when to take this   clopidogrel 75 MG tablet Commonly known as: PLAVIX TAKE 1 TABLET(75 MG) BY MOUTH DAILY   cyclobenzaprine 5 MG tablet Commonly known as: FLEXERIL Take 5 mg by mouth at bedtime as needed for muscle spasms.   Fluticasone-Salmeterol 250-50 MCG/DOSE Aepb Commonly known as: Advair Diskus TAKE 1 PUFF BY MOUTH TWICE A DAY   folic acid 1 MG tablet Commonly known as: FOLVITE Take 1 tablet (1 mg total) by mouth daily.   metoprolol succinate 100 MG 24 hr tablet Commonly known as: TOPROL-XL TAKE 1 TABLET BY MOUTH EVERY DAY, TAKE WITH OR IMMEDIATELY FOLLOWING A MEAL What changed: Another  medication with the same name was removed. Continue taking this medication, and follow the directions you see here. Changed by: [name], MD   thiamine 100 MG tablet Take 1 tablet (100 mg total) by mouth daily.   Trelegy Ellipta 100-62.5-25 MCG/INH Aepb Generic drug: Fluticasone-Umeclidin-Vilant Inhale 1 puff into the lungs daily.        Follow-up: Return in about 6 months (around 01/28/2020).  [name], M.D.

## 2019-08-01 LAB — CMP14+EGFR
ALT: 38 IU/L (ref 0–44)
AST: 38 IU/L (ref 0–40)
Albumin/Globulin Ratio: 2 (ref 1.2–2.2)
Albumin: 4.7 g/dL (ref 3.8–4.8)
Alkaline Phosphatase: 102 IU/L (ref 39–117)
BUN/Creatinine Ratio: 9 — ABNORMAL LOW (ref 10–24)
BUN: 11 mg/dL (ref 8–27)
Bilirubin Total: 1.4 mg/dL — ABNORMAL HIGH (ref 0.0–1.2)
CO2: 21 mmol/L (ref 20–29)
Calcium: 9.8 mg/dL (ref 8.6–10.2)
Chloride: 102 mmol/L (ref 96–106)
Creatinine, Ser: 1.21 mg/dL (ref 0.76–1.27)
GFR calc Af Amer: 73 mL/min/{1.73_m2} (ref >59)
GFR calc non Af Amer: 63 mL/min/{1.73_m2} (ref >59)
Globulin, Total: 2.4 g/dL (ref 1.5–4.5)
Glucose: 89 mg/dL (ref 65–99)
Potassium: 4.6 mmol/L (ref 3.5–5.2)
Sodium: 140 mmol/L (ref 134–144)
Total Protein: 7.1 g/dL (ref 6.0–8.5)

## 2019-08-01 LAB — CBC WITH DIFFERENTIAL/PLATELET
Basophils Absolute: 0 10*3/uL (ref 0.0–0.2)
Basos: 1 %
EOS (ABSOLUTE): 0.1 10*3/uL (ref 0.0–0.4)
Eos: 1 %
Hematocrit: 46.1 % (ref 37.5–51.0)
Hemoglobin: 16.3 g/dL (ref 13.0–17.7)
Immature Grans (Abs): 0 10*3/uL (ref 0.0–0.1)
Immature Granulocytes: 0 %
Lymphocytes Absolute: 1.6 10*3/uL (ref 0.7–3.1)
Lymphs: 21 %
MCH: 34.7 pg — ABNORMAL HIGH (ref 26.6–33.0)
MCHC: 35.4 g/dL (ref 31.5–35.7)
MCV: 98 fL — ABNORMAL HIGH (ref 79–97)
Monocytes Absolute: 0.7 10*3/uL (ref 0.1–0.9)
Monocytes: 8 %
Neutrophils Absolute: 5.4 10*3/uL (ref 1.4–7.0)
Neutrophils: 69 %
Platelets: 196 10*3/uL (ref 150–450)
RBC: 4.7 x10E6/uL (ref 4.14–5.80)
RDW: 13 % (ref 11.6–15.4)
WBC: 7.8 10*3/uL (ref 3.4–10.8)

## 2019-08-01 LAB — URINALYSIS
Bilirubin, UA: NEGATIVE
Glucose, UA: NEGATIVE
Ketones, UA: NEGATIVE
Leukocytes,UA: NEGATIVE
Nitrite, UA: NEGATIVE
Protein,UA: NEGATIVE
RBC, UA: NEGATIVE
Specific Gravity, UA: 1.02 (ref 1.005–1.030)
Urobilinogen, Ur: 0.2 mg/dL (ref 0.2–1.0)
pH, UA: 5.5 (ref 5.0–7.5)

## 2019-08-01 LAB — PSA TOTAL (REFLEX TO FREE): Prostate Specific Ag, Serum: 0.3 ng/mL (ref 0.0–4.0)

## 2019-08-01 LAB — TSH: TSH: 3.38 u[IU]/mL (ref 0.450–4.500)

## 2019-08-01 LAB — LIPID PANEL
Chol/HDL Ratio: 2.8 ratio (ref 0.0–5.0)
Cholesterol, Total: 139 mg/dL (ref 100–199)
HDL: 49 mg/dL (ref >39)
LDL Chol Calc (NIH): 66 mg/dL (ref 0–99)
Triglycerides: 140 mg/dL (ref 0–149)
VLDL Cholesterol Cal: 24 mg/dL (ref 5–40)

## 2019-08-01 LAB — VITAMIN D 25 HYDROXY (VIT D DEFICIENCY, FRACTURES): Vit D, 25-Hydroxy: 36.4 ng/mL (ref 30.0–100.0)

## 2019-08-05 NOTE — Progress Notes (Signed)
Hello [place],  Your lab result is normal and/or stable.Some minor variations that are not significant are commonly marked abnormal, but do not represent any medical problem for you.  Best regards, Kelita Wallis, M.D.

## 2019-08-21 ENCOUNTER — Other Ambulatory Visit: Payer: Self-pay | Admitting: Family Medicine

## 2019-11-01 IMAGING — MR MR MRA HEAD W/O CM
10 of 12 series · 32 of 48 positions shown · non-contrast
Comparison: Prior CT from 03/10/2018

CLINICAL DATA: Initial evaluation for acute right arm and leg
dysfunction

EXAM:
MRI HEAD WITHOUT CONTRAST
MRA HEAD WITHOUT CONTRAST
TECHNIQUE: Multiplanar, multiecho pulse sequences of the brain and surrounding
structures were obtained without intravenous contrast. Angiographic
images of the head were obtained using MRA technique without
contrast.

[Series 5001: ax dwi_tracew · axial · 3.0mm · 1.50mm/px · z∈[+6,+152]mm · 5 of 84 slices shown]
[im 1/84]
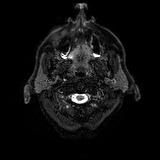
[im 21/84]
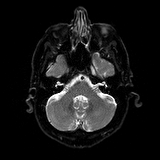
[im 42/84]
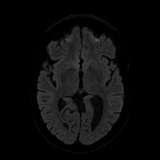
[im 63/84]
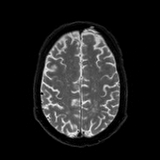
[im 84/84]
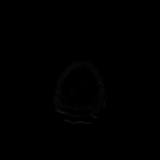

[Series 6001: ax dwi_adc · axial · 3.0mm · 1.50mm/px · z∈[+6,+152]mm · 3 of 42 slices shown]
[im 1/42]
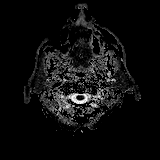
[im 21/42]
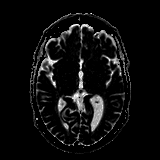
[im 42/42]
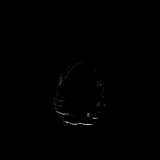

[DWI · coronal · 4.0mm · 0.88mm/px · 5 of 74 slices shown (1 of 2)]
[im 1/74]
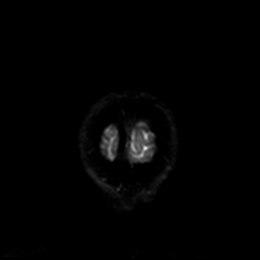
[im 19/74]
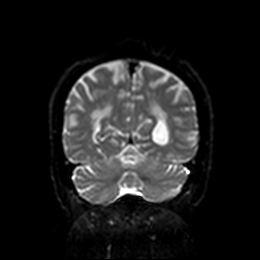
[im 37/74]
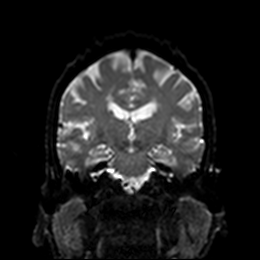
[im 55/74]
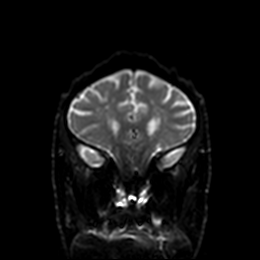
[im 74/74]
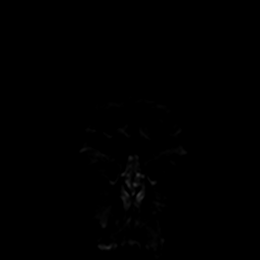

[DWI · coronal · 4.0mm · 0.88mm/px · 3 of 37 slices shown (2 of 2)]
[im 1/37]
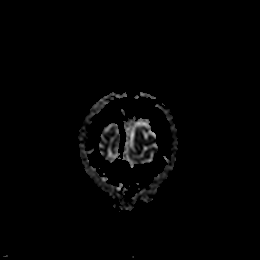
[im 19/37]
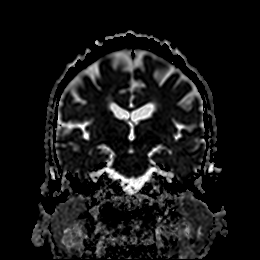
[im 37/37]
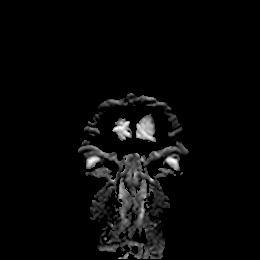

[T1 · sagittal · 5.0mm · 0.75mm/px · 2 of 24 slices shown]
[im 1/24]
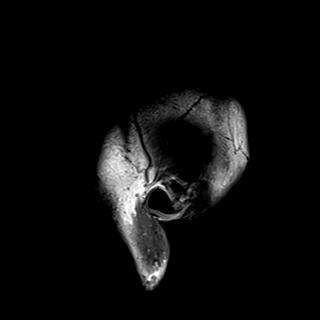
[im 24/24]
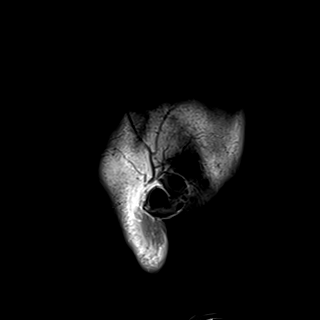

[T2 · axial · 5.0mm · 0.69mm/px · z∈[+4,+146]mm · 2 of 25 slices shown (1 of 2)]
[im 1/25]
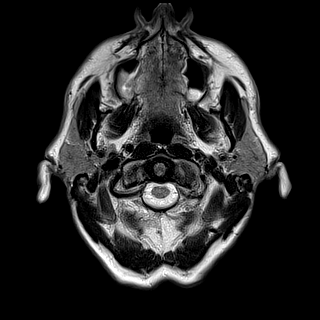
[im 25/25]
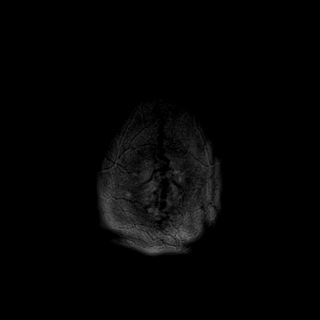

[FLAIR · axial · 5.0mm · 0.43mm/px · z∈[+5,+147]mm · 2 of 25 slices shown]
[im 1/25]
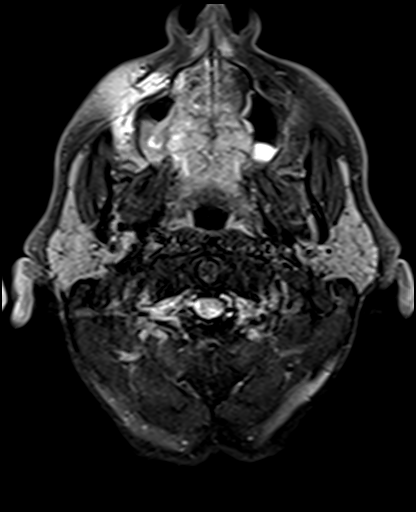
[im 25/25]
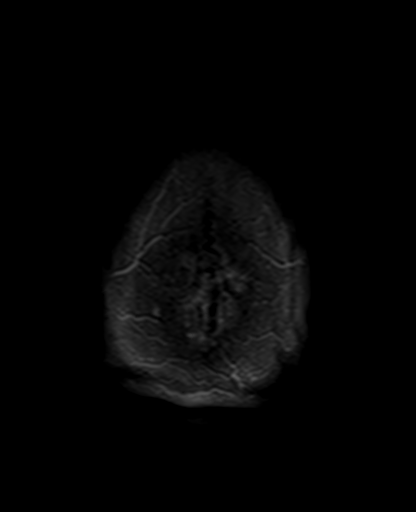

[swi_images · axial · 3.0mm · 0.86mm/px · z∈[-5,+169]mm · 4 of 60 slices shown]
[im 1/60]
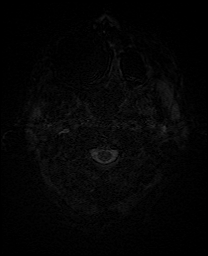
[im 20/60]
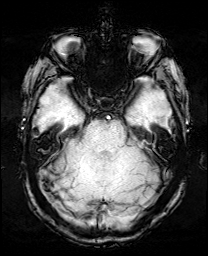
[im 40/60]
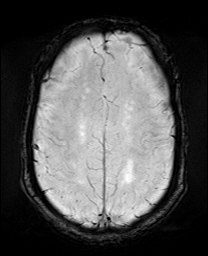
[im 60/60]
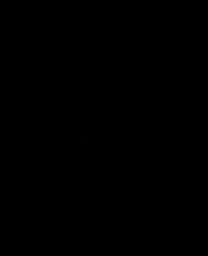

[mip_images(sw) · axial · 24.0mm · 0.86mm/px · z∈[+5,+159]mm · 4 of 53 slices shown]
[im 1/53]
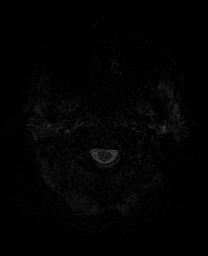
[im 18/53]
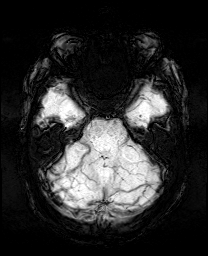
[im 35/53]
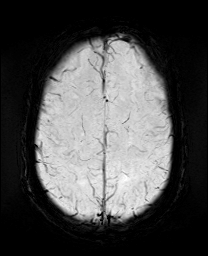
[im 53/53]
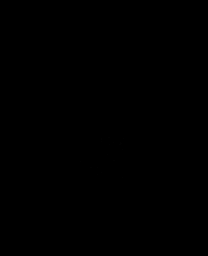

[T2 · coronal · 5.0mm · 0.34mm/px · 2 of 30 slices shown (2 of 2)]
[im 1/30]
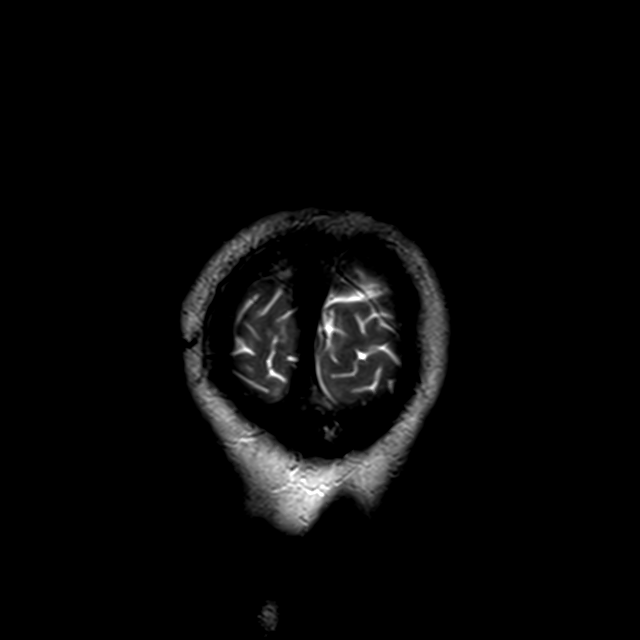
[im 30/30]
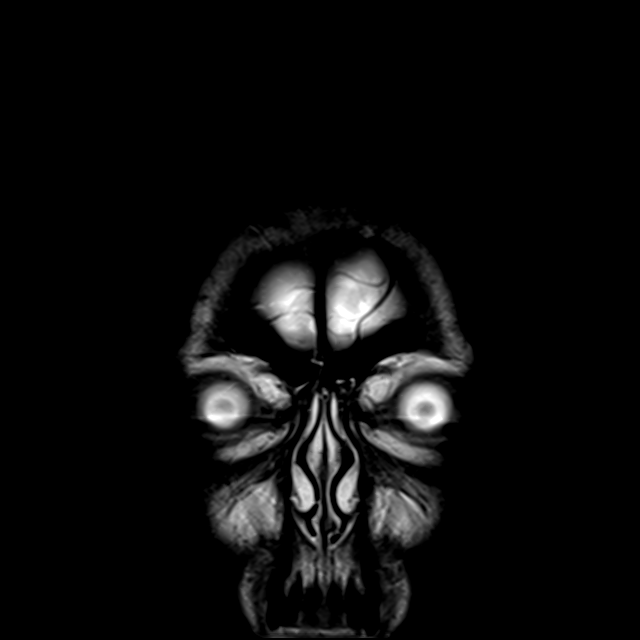

[32 of 48 positions shown; findings below may reference images not displayed]

FINDINGS: MRI HEAD FINDINGS

Brain: Generalized age appropriate cerebral atrophy with mild to
moderate chronic small vessel ischemic change. No abnormal foci of
restricted diffusion to suggest acute or subacute ischemia.
Gray-white matter differentiation maintained. No evidence for remote
cortical infarction. No acute or chronic intracranial hemorrhage.

No mass lesion, midline shift or mass effect. No hydrocephalus. No
extra-axial fluid collection. Major dural sinuses are grossly
patent.

Incidental note made of a partially empty sella. Midline structures
intact and normal.

Vascular: Major intracranial vascular flow voids are maintained.

Skull and upper cervical spine: Craniocervical junction within
normal limits. Mild multilevel degenerate spondylolysis noted within
the upper cervical spine without significant stenosis. Bone marrow
signal intensity within normal limits. No scalp soft tissue
abnormality.

Sinuses/Orbits: Globes and orbital soft tissues within normal
limits. Mild mucosal thickening within the ethmoidal air cells and
maxillary sinuses bilaterally. Paranasal sinuses are otherwise
clear. Trace opacity right mastoid air cells, of doubtful
significance. Inner ear structures normal.

Other: None.

MRA HEAD FINDINGS

ANTERIOR CIRCULATION:

Distal cervical segments of the internal carotid arteries widely
patent bilaterally. Petrous segments widely patent. Mild
atheromatous irregularity within the cavernous ICAs with relatively
mild multifocal narrowing. ICA termini widely patent. A1 segments
widely patent, with the right being dominant. Normal anterior
communicating artery. Anterior cerebral arteries widely patent to
their distal aspects. No M1 stenosis or occlusion. No proximal M2
occlusion. Distal MCA branches well perfused and symmetric.

POSTERIOR CIRCULATION:

Vertebral arteries widely patent to the vertebrobasilar junction.
Right vert is dominant. Posterior inferior cerebral arteries patent
proximally. Basilar artery patent to its distal aspect without
stenosis. Superior cerebral arteries patent bilaterally. Both of the
PCAs primarily supplied via the basilar. Mild atherosclerotic change
within the PCAs bilaterally without high-grade stenosis.

No aneurysm.
IMPRESSION: MRI HEAD IMPRESSION

1. No acute intracranial abnormality.
2. Mild to moderate chronic small vessel ischemic change.

MRA HEAD IMPRESSION

1. Negative intracranial MRA for large vessel occlusion.
2. Mild atherosclerotic change for age without hemodynamically
significant or correctable stenosis.

## 2019-12-15 ENCOUNTER — Ambulatory Visit (INDEPENDENT_AMBULATORY_CARE_PROVIDER_SITE_OTHER): Payer: 59 | Admitting: Physician Assistant

## 2019-12-15 ENCOUNTER — Encounter: Payer: Self-pay | Admitting: Physician Assistant

## 2019-12-15 DIAGNOSIS — I10 Essential (primary) hypertension: Secondary | ICD-10-CM

## 2019-12-15 MED ORDER — METOPROLOL SUCCINATE ER 50 MG PO TB24: 50.0000 mg | ORAL_TABLET | Freq: Every day | ORAL | Status: DC

## 2019-12-15 NOTE — Progress Notes (Signed)
  Current Outpatient Medications:  .  atorvastatin (LIPITOR) 80 MG tablet, TAKE 1 TABLET(80 MG) BY MOUTH DAILY AT 6 PM, Disp: 90 tablet, Rfl: 1 .  Baclofen 5 MG TABS, TAKE 1 TABLET BY MOUTH THREE TIMES DAILY (Patient taking differently: Take 10 mg by mouth 2 (two) times daily. ), Disp: 180 tablet, Rfl: 4 .  clopidogrel (PLAVIX) 75 MG tablet, TAKE 1 TABLET(75 MG) BY MOUTH DAILY, Disp: 90 tablet, Rfl: 1 .  cyclobenzaprine (FLEXERIL) 5 MG tablet, Take 5 mg by mouth at bedtime as needed for muscle spasms. , Disp: , Rfl:  .  Fluticasone-Salmeterol (ADVAIR DISKUS) 250-50 MCG/DOSE AEPB, TAKE 1 PUFF BY MOUTH TWICE A DAY (Patient not taking: Reported on 07/31/2019), Disp: 60 each, Rfl: 5 .  Fluticasone-Umeclidin-Vilant (TRELEGY ELLIPTA) 100-62.5-25 MCG/INH AEPB, Inhale 1 puff into the lungs daily., Disp: 28 each, Rfl: 11 .  folic acid (FOLVITE) 1 MG tablet, Take 1 tablet (1 mg total) by mouth daily., Disp: 30 tablet, Rfl: 0 .  metoprolol succinate (TOPROL-XL) 100 MG 24 hr tablet, TAKE 1 TABLET BY MOUTH EVERY DAY, TAKE WITH OR IMMEDIATELY FOLLOWING A MEAL, Disp: 90 tablet, Rfl: 1 .  thiamine 100 MG tablet, Take 1 tablet (100 mg total) by mouth daily., Disp: 30 tablet, Rfl: 0  1207   BP readings 140s over 90  [facility] [place]   There had been some confusion with his metoprolol.  He previously had been on 100 mg XR and lowered to 50 mg a few months ago.  So he was using up the prescription that he had of the 100s.  However he had run out of those.  His pharmacy said he did not have anything else on file.  However Dr. Berneice Heinrich had sent a prescription for metoprolol XR 50 mg 1 daily to the pharmacy.  They were able to find it today and are getting it filled for him.    I have updated the prescription in the epic record but did not send the prescription

## 2020-01-18 ENCOUNTER — Other Ambulatory Visit: Payer: Self-pay | Admitting: Adult Health

## 2020-01-18 DIAGNOSIS — M62838 Other muscle spasm: Secondary | ICD-10-CM

## 2020-02-08 ENCOUNTER — Telehealth: Payer: Self-pay | Admitting: Adult Health

## 2020-02-08 DIAGNOSIS — M62838 Other muscle spasm: Secondary | ICD-10-CM

## 2020-02-08 MED ORDER — BACLOFEN 5 MG PO TABS: 1.0000 | ORAL_TABLET | Freq: Three times a day (TID) | ORAL | 4 refills | Status: DC

## 2020-02-08 NOTE — Telephone Encounter (Signed)
1) Medication(s) Requested (by name): Baclofen 5 MG TABS   2) Pharmacy of Choice:  [id number] - [place], [place] - [address]  [address] [place], [place] [address]

## 2020-02-18 ENCOUNTER — Other Ambulatory Visit: Payer: Self-pay | Admitting: Family Medicine

## 2020-04-03 ENCOUNTER — Ambulatory Visit: Payer: 59 | Admitting: Adult Health

## 2020-04-03 ENCOUNTER — Encounter: Payer: Self-pay | Admitting: Adult Health

## 2020-04-03 ENCOUNTER — Other Ambulatory Visit: Payer: Self-pay

## 2020-04-03 VITALS — BP 107/67 | HR 63 | Ht 70.5 in | Wt 201.0 lb

## 2020-04-03 DIAGNOSIS — M62838 Other muscle spasm: Secondary | ICD-10-CM

## 2020-04-03 DIAGNOSIS — Z8673 Personal history of transient ischemic attack (TIA), and cerebral infarction without residual deficits: Secondary | ICD-10-CM

## 2020-04-03 DIAGNOSIS — R252 Cramp and spasm: Secondary | ICD-10-CM | POA: Diagnosis not present

## 2020-04-03 MED ORDER — BACLOFEN 10 MG PO TABS: 10.0000 mg | ORAL_TABLET | Freq: Three times a day (TID) | ORAL | 3 refills | Status: DC | PRN

## 2020-04-03 NOTE — Progress Notes (Signed)
[facility] [address] [place]. [place] [address] [phone]       OFFICE FOLLOW UP NOTE  [name] Date of Birth:  1956/09/07 Medical Record Number:  [id number]   Reason for Referral: stroke follow up  CHIEF COMPLAINT:  Chief Complaint  Patient presents with  . Follow-up    rm 9, stroke fu, alone, pt states he is doing well, complains of hand and leg cramps, pt states baclofen does not work well for him   . Cerebrovascular Accident    Stable.  . Muscle Pain    Chronic bilateral foot and hand cramping    HPI:  Today, 04/03/2020, Mr. Kleist is being seen for follow up regarding history of stroke and chronic bilateral hand and feet cramping.   He has been stable from a stroke standpoint without new or reoccurring stroke/TIA symptoms.  He continues on clopidogrel 75 mg daily and atorvastatin 80 mg daily for secondary stroke prevention.  Blood pressure today 107/67.  Continues to follow closely with PCP for HTN and HLD management.  Chronic muscle spasms of bilateral feet and hands which have been present since 2014.  Endorses sensation of cramping and tightness.  Continues on baclofen 5 mg 3 times daily as needed initially experiencing great benefit and reduction of cramping frequency but recently he has not been experiencing as much benefit.  Symptoms have been relatively stable without worsening.  Hand cramping typically occurs after increased activity that requires frequent use of hands.  Bilateral foot cramping typically only present during the night.  He is currently on statin therapy but symptoms present prior to initiating statin.  He does endorse occasional joint pain but denies numbness/tingling or weakness.    History provided for reference purposes only 06/05/2019 update: [name] is a 64 year old male who is being seen today for stroke follow-up.  He has been stable from a stroke standpoint.  He continues on baclofen with continued  benefit of muscle spasms.  Continues on clopidogrel and atorvastatin for secondary stroke prevention without reported side effects.  Blood pressure stable 133/77.  Denies new or worsening stroke/TIA symptoms.  12/05/2018 update: [name] is being seen today for stroke follow up visit.  Overall, patient has been stable from a stroke standpoint.  He continues on Plavix without side effects of bleeding or bruising.  Continues on atorvastatin without side effects myalgias.  He did have lipid panel obtained on 09/10/2018 which showed LDL 42.  Blood pressure today 134/74.  He continues on baclofen 5 mg 3 times daily for lower extremity spasms with benefit.  He occasionally will use Flexeril 5 mg for breakthrough spasms.  He does have concerns today of weight gain and is questioning whether a specific medication could be doing this.  Denies new or worsening stroke/TIA symptoms.  06/01/18 UPDATE: Patient is being seen today for routine follow-up appointment.  Patient did undergo EMG/nerve conduction study but only showed very mild bilateral lower extremity neuropathy.  Patient continues to take baclofen twice daily which has greatly reduced his muscle spasms.  He states on occasion he may have increase spasms during the day in between doses but overall feels as though these have greatly improved.  He has continued to tolerate baclofen well without side effects.  Does have complaints of continued dizziness for which she has had for the past 5 years that occurs with rapid movement or position changes and lasts only seconds.  Patient was provided with BPPV home exercises as he is  declining PT at this time.  Continues to take Plavix without side effects of bleeding or bruising.  Continues to take Lipitor without side effects myalgias.  Blood pressure today satisfactory 133/74.  Denies new or worsening stroke/TIA symptoms.  Initial visit 03/30/2018 JM: Patient is being seen today for hospital follow-up visit and is  accompanied by his wife.  All symptoms have resolved and he is back to doing all normal activities.  He does complain of daily headaches since his stroke.  Denies phonophobia or nausea vomiting but does endorse photophobia.  He has been taking extra strength Tylenol which helps relieve these headaches.  They are not debilitating as he can continue to do certain activities but also states he has not been as active due to recent renal stent placement.  Patient also complains of muscle cramps that he states is been occurring for the past 5 years where his muscles will contract and become very painfu.  This typically happens when patient has increased fatigue or with increased activity.  They are also worse in the evening or with cold temperature.  He has recently been taking Flexeril without much relief.  Patient states he has had multiple blood tests in the past by primary providers but all negative.  Continues to take both aspirin and Plavix without side effects of bleeding or bruising.  Continues to take Lipitor without increased side effects of myalgias.  Blood pressure today at beginning of visit 153/93 and at recheck at end of visit 102/60.  States he has not smoked cigarettes since hospital discharge.  Denies new or worsening stroke/TIA symptoms.   Stroke admission 03/10/2018: [name] is a 64 y.o. male with history of pulmonary nodule, ongoing tobacco use, ETOH use, hypertension, hyperlipidemia, COPD, CKD with likely renal artery stenosis presenting with intermittent right-sided weakness. He did not receive IV t-PA due to resolution of deficits and late presentation. However, at lunch time, his right sided weakness returned. CT head reviewed and showed chronic stable mild to moderate small vessel ischemic disease with no significant change from prior imaging.  Initial MRI showed no acute intercranial abnormality.  Repeat MRI did show acute/early subacute infarctions within the left posterior limb  of internal capsule and right posteromedial thalamus.  Carotid Dopplers are unremarkable.  2D echo showed an EF of 60 to 65%.  Patient was not on antithrombotic prior to admission but due to concerns of capsular warning syndrome, he was loaded with aspirin and Plavix along with Lipitor 80 mg and recommended to continue DAPT for 3 weeks and then Plavix alone.  LDL 134 and recommended to continue Lipitor 80 mg at discharge.  Therapies recommended CIR placement.      ROS:   14 system review of systems performed and negative with exception of muscle spasm/cramping  PMH:  Past Medical History:  Diagnosis Date  . BPH (benign prostatic hyperplasia)   . COPD (chronic obstructive pulmonary disease) ([place])   . Hyperlipidemia   . Hypertension   . Pulmonary nodule   . renal stents   . Stroke [facility])     PSH:  Past Surgical History:  Procedure Laterality Date  . CERVICAL FUSION    . NO PAST SURGERIES    . renal stents      Social History:  Social History   Socioeconomic History  . Marital status: Married    Spouse name: Not on file  . Number of children: Not on file  . Years of education: Not on file  .  Highest education level: Not on file  Occupational History  . Occupation: Museum/gallery curator  Tobacco Use  . Smoking status: Former Smoker    Packs/day: 1.00    Years: 42.00    Pack years: 42.00    Types: Cigarettes  . Smokeless tobacco: Never Used  Substance and Sexual Activity  . Alcohol use: Never    Comment: not dirnking now  . Drug use: Never  . Sexual activity: Not on file  Other Topics Concern  . Not on file  Social History Narrative  . Not on file   Social Determinants of Health   Financial Resource Strain:   . Difficulty of Paying Living Expenses:   Food Insecurity:   . Worried About Charity fundraiser in the Last Year:   . Arboriculturist in the Last Year:   Transportation Needs:   . Film/video editor (Medical):   Marland Kitchen Lack of Transportation (Non-Medical):   Physical  Activity:   . Days of Exercise per Week:   . Minutes of Exercise per Session:   Stress:   . Feeling of Stress :   Social Connections:   . Frequency of Communication with Friends and Family:   . Frequency of Social Gatherings with Friends and Family:   . Attends Religious Services:   . Active Member of Clubs or Organizations:   . Attends Archivist Meetings:   Marland Kitchen Marital Status:   Intimate Partner Violence:   . Fear of Current or Ex-Partner:   . Emotionally Abused:   Marland Kitchen Physically Abused:   . Sexually Abused:     Family History:  Family History  Problem Relation Age of Onset  . Emphysema Father   . Cancer Father   . Lung cancer Mother     Medications:   Current Outpatient Medications on File Prior to Visit  Medication Sig Dispense Refill  . aspirin 81 MG EC tablet Take by mouth.    Marland Kitchen atorvastatin (LIPITOR) 80 MG tablet TAKE 1 TABLET(80 MG) BY MOUTH DAILY AT 6 PM 90 tablet 0  . Baclofen 5 MG TABS Take 1 tablet by mouth 3 (three) times daily. 180 tablet 4  . brimonidine (ALPHAGAN) 0.2 % ophthalmic solution Place 1 drop into both eyes 3 (three) times a day.    . clopidogrel (PLAVIX) 75 MG tablet TAKE 1 TABLET(75 MG) BY MOUTH DAILY 90 tablet 0  . cyclobenzaprine (FLEXERIL) 5 MG tablet Take 5 mg by mouth at bedtime as needed for muscle spasms.     . Fluticasone-Umeclidin-Vilant (TRELEGY ELLIPTA) 100-62.5-25 MCG/INH AEPB Inhale 1 puff into the lungs daily. 28 each 11  . folic acid (FOLVITE) 1 MG tablet Take 1 tablet (1 mg total) by mouth daily. 30 tablet 0  . metoprolol succinate (TOPROL-XL) 50 MG 24 hr tablet Take 1 tablet (50 mg total) by mouth daily. Take with or immediately following a meal.    . ofloxacin (OCUFLOX) 0.3 % ophthalmic solution INT 1 GTT IN OD QID FOR 10 DAYS. START AFTER SURGERY    . prednisoLONE acetate (PRED FORTE) 1 % ophthalmic suspension Apply to eye.    . thiamine 100 MG tablet Take 1 tablet (100 mg total) by mouth daily. 30 tablet 0  . timolol  (TIMOPTIC) 0.5 % ophthalmic solution Apply to eye.     No current facility-administered medications on file prior to visit.    Allergies:  No Known Allergies   Physical Exam  Vitals:   04/03/20 1527  BP: 107/67  Pulse: 63  Weight: 201 lb (91.2 kg)  Height: 5' 10.5" (1.791 m)   Body mass index is 28.43 kg/m. No exam data present  General: Pleasant middle-aged Caucasian male, seated, in no evident distress Head: head normocephalic and atraumatic.   Neck: supple with no carotid or supraclavicular bruits Cardiovascular: regular rate and rhythm, no murmurs Musculoskeletal: no deformity Skin:  no rash/petichiae Vascular:  Normal pulses all extremities  Neurologic Exam Mental Status: Awake and fully alert. Oriented to place and time. Recent and remote memory intact. Attention span, concentration and fund of knowledge appropriate. Mood and affect appropriate.  Cranial Nerves: Pupils equal, briskly reactive to light. Extraocular movements full without nystagmus. Visual fields full to confrontation. Hearing intact. Facial sensation intact. Face, tongue, palate moves normally and symmetrically.  Motor: Normal bulk and tone. Normal strength in all tested extremity muscles. Sensory.: intact to touch , pinprick , position and vibratory sensation.  Coordination: Rapid alternating movements normal in all extremities. Finger-to-nose and heel-to-shin performed accurately bilaterally. Gait and Station: Arises from chair without difficulty. Stance is normal. Gait demonstrates normal stride length and balance . Able to heel, toe and tandem walk without difficulty.  Reflexes: 1+ and symmetric. Toes downgoing.        ASSESSMENT/PLAN: Stiles Alpers is a 64 y.o. year old male here with left internal capsule and right thalamus on 03/10/2018 secondary to small vessel disease recovered well without residual deficits. Vascular risk factors include HTN, HLD and smoking.  Chronic bilateral foot and  hand spasms/cramping    Stroke: -Continue clopidogrel 75 mg daily and Lipitor for secondary stroke prevention -F/u with PCP regarding your HLD and HTN management -continue to monitor BP at home -Maintain strict control of hypertension with blood pressure goal below 130/90, diabetes with hemoglobin A1c goal below 6.5% and cholesterol with LDL cholesterol (bad cholesterol) goal below 70 mg/dL. I also advised the patient to eat a healthy diet with plenty of whole grains, cereals, fruits and vegetables, exercise regularly and maintain ideal body weight.  Muscle cramping/spasms: -Rule out underlying causes with lab work Orders Placed This Encounter  Procedures  . Sedimentation Rate  . C-reactive Protein  . Rheumatoid Factor  . Ferritin  -Increase baclofen dosage to 10 mg 3 times daily as needed -May consider referral to orthopedics or rheumatology if symptoms persist -Discussion regarding importance of adequate fluid intake and adequate exercise   Follow-up in 6 months or call earlier if needed   I spent 35 minutes of face-to-face and non-face-to-face time with patient.  This included previsit chart review, lab review, study review, order entry, electronic health record documentation, patient education   Venancio Poisson, [facility]  [facility] [address] [place], [place] [address]  Phone [phone] Fax [phone]

## 2020-04-03 NOTE — Patient Instructions (Signed)
Continuation of baclofen but will increase dosage to 10 mg 3 times daily as needed  We will obtain lab work today to rule out conditions that could be causing ongoing cramping    Follow-up in 6 months or call earlier as needed

## 2020-04-04 ENCOUNTER — Telehealth: Payer: Self-pay

## 2020-04-04 LAB — SEDIMENTATION RATE: Sed Rate: 2 mm/hr (ref 0–30)

## 2020-04-04 LAB — FERRITIN: Ferritin: 202 ng/mL (ref 30–400)

## 2020-04-04 LAB — C-REACTIVE PROTEIN: CRP: 2 mg/L (ref 0–10)

## 2020-04-04 LAB — RHEUMATOID FACTOR: Rheumatoid fact SerPl-aCnc: <10 IU/mL (ref 0.0–13.9)

## 2020-04-04 NOTE — Telephone Encounter (Signed)
Pt verified by name and DOB, results given per provider, pt voiced understanding all question answered.      Frann Rider, NP  Andre Lefort, CMA  Please advise patient that recent lab work did not show any evidence of rheumatoid arthritis or low iron levels

## 2020-04-04 NOTE — Progress Notes (Signed)
I agree with the above plan 

## 2020-05-01 ENCOUNTER — Ambulatory Visit: Payer: 59 | Admitting: Family Medicine

## 2020-05-14 ENCOUNTER — Other Ambulatory Visit: Payer: Self-pay | Admitting: Family Medicine

## 2020-06-05 ENCOUNTER — Encounter: Payer: Self-pay | Admitting: Family Medicine

## 2020-06-09 ENCOUNTER — Other Ambulatory Visit: Payer: Self-pay | Admitting: Family Medicine

## 2020-06-11 ENCOUNTER — Other Ambulatory Visit: Payer: Self-pay | Admitting: Family Medicine

## 2020-06-11 NOTE — Telephone Encounter (Signed)
Stacks. NTBS 30 days given 05/14/20

## 2020-06-11 NOTE — Telephone Encounter (Signed)
Pt scheduled with Dr Livia Snellen 07/03/20 at 10:55.

## 2020-06-13 ENCOUNTER — Other Ambulatory Visit: Payer: Self-pay | Admitting: Family Medicine

## 2020-07-03 ENCOUNTER — Other Ambulatory Visit: Payer: Self-pay

## 2020-07-03 ENCOUNTER — Encounter: Payer: Self-pay | Admitting: Family Medicine

## 2020-07-03 ENCOUNTER — Ambulatory Visit: Payer: 59 | Admitting: Family Medicine

## 2020-07-03 VITALS — BP 116/70 | HR 72 | Temp 97.4°F | Resp 20 | Ht 70.5 in | Wt 202.5 lb

## 2020-07-03 DIAGNOSIS — R531 Weakness: Secondary | ICD-10-CM

## 2020-07-03 DIAGNOSIS — N400 Enlarged prostate without lower urinary tract symptoms: Secondary | ICD-10-CM | POA: Diagnosis not present

## 2020-07-03 DIAGNOSIS — I10 Essential (primary) hypertension: Secondary | ICD-10-CM

## 2020-07-03 DIAGNOSIS — J449 Chronic obstructive pulmonary disease, unspecified: Secondary | ICD-10-CM | POA: Diagnosis not present

## 2020-07-03 DIAGNOSIS — E785 Hyperlipidemia, unspecified: Secondary | ICD-10-CM | POA: Diagnosis not present

## 2020-07-03 DIAGNOSIS — Z125 Encounter for screening for malignant neoplasm of prostate: Secondary | ICD-10-CM

## 2020-07-03 DIAGNOSIS — Z87891 Personal history of nicotine dependence: Secondary | ICD-10-CM

## 2020-07-03 DIAGNOSIS — E559 Vitamin D deficiency, unspecified: Secondary | ICD-10-CM

## 2020-07-03 DIAGNOSIS — Z1159 Encounter for screening for other viral diseases: Secondary | ICD-10-CM

## 2020-07-03 MED ORDER — METOPROLOL SUCCINATE ER 50 MG PO TB24: 50.0000 mg | ORAL_TABLET | Freq: Every day | ORAL | 1 refills | Status: DC

## 2020-07-03 MED ORDER — HALOBETASOL PROPIONATE 0.05 % EX CREA: TOPICAL_CREAM | Freq: Two times a day (BID) | CUTANEOUS | 5 refills | Status: DC

## 2020-07-03 MED ORDER — TRELEGY ELLIPTA 100-62.5-25 MCG/INH IN AEPB: 1.0000 | INHALATION_SPRAY | Freq: Every day | RESPIRATORY_TRACT | 5 refills | Status: DC

## 2020-07-03 MED ORDER — ATORVASTATIN CALCIUM 80 MG PO TABS: ORAL_TABLET | ORAL | 1 refills | Status: DC

## 2020-07-03 MED ORDER — CLOPIDOGREL BISULFATE 75 MG PO TABS: ORAL_TABLET | ORAL | 1 refills | Status: DC

## 2020-07-03 NOTE — Progress Notes (Addendum)
Subjective:  Patient ID: Daniel [name], male    DOB: 1956/03/16  Age: 64 y.o. MRN: [id number]  CC: Medical Management of Chronic Issues   HPI [name] presents for follow-up of elevated cholesterol. Doing well without complaints on current medication. Denies side effects of statin including myalgia and arthralgia and nausea. Also in today for liver function testing. Currently no chest pain, shortness of breath or other cardiovascular related symptoms noted. History Daniel [name] has a past medical history of Acute ischemic stroke ([place]) (03/12/2018), BPH (benign prostatic hyperplasia), COPD (chronic obstructive pulmonary disease) ([place]), Hyperlipidemia, Hypertension, Pulmonary nodule, renal stents, Stroke ([place]), and TIA (transient ischemic attack) (03/11/2018).   He has a past surgical history that includes No past surgeries; renal stents; and Cervical fusion.   His family history includes Cancer in his father; Emphysema in his father; Lung cancer in his mother.He reports that he has quit smoking. His smoking use included cigarettes. He has a 42.00 pack-year smoking history. He has never used smokeless tobacco. He reports that he does not drink alcohol and does not use drugs.  Current Outpatient Medications on File Prior to Visit  Medication Sig Dispense Refill  . aspirin 81 MG EC tablet Take by mouth.    . baclofen (LIORESAL) 10 MG tablet Take 1 tablet (10 mg total) by mouth 3 (three) times daily as needed for muscle spasms. 90 each 3  . Baclofen 5 MG TABS Take 1 tablet by mouth 3 (three) times daily. 180 tablet 4  . brimonidine (ALPHAGAN) 0.2 % ophthalmic solution Place 1 drop into both eyes 3 (three) times a day.    . cyclobenzaprine (FLEXERIL) 5 MG tablet Take 5 mg by mouth at bedtime as needed for muscle spasms.     . folic acid (FOLVITE) 1 MG tablet Take 1 tablet (1 mg total) by mouth daily. 30 tablet 0  . prednisoLONE acetate (PRED FORTE) 1 % ophthalmic suspension Apply to eye.     . thiamine 100 MG tablet Take 1 tablet (100 mg total) by mouth daily. 30 tablet 0  . timolol (TIMOPTIC) 0.5 % ophthalmic solution Apply to eye.     No current facility-administered medications on file prior to visit.    ROS Review of Systems  Constitutional: Negative.   HENT: Negative.   Eyes: Negative for visual disturbance.  Respiratory: Positive for shortness of breath (chronic, stable, at baseline). Negative for cough.   Cardiovascular: Negative for chest pain and leg swelling.  Gastrointestinal: Negative for abdominal pain, diarrhea, nausea and vomiting.  Genitourinary: Negative for difficulty urinating.  Musculoskeletal: Negative for arthralgias and myalgias.  Skin: Negative for rash.  Neurological: Negative for headaches.  Psychiatric/Behavioral: Negative for sleep disturbance.    Objective:  BP 116/70   Pulse 72   Temp (!) 97.4 F (36.3 C) (Temporal)   Resp 20   Ht 5' 10.5" (1.791 m)   Wt 202 lb 8 oz (91.9 kg)   SpO2 99%   BMI 28.65 kg/m   BP Readings from Last 3 Encounters:  07/03/20 116/70  04/03/20 107/67  07/31/19 118/74    Wt Readings from Last 3 Encounters:  07/03/20 202 lb 8 oz (91.9 kg)  04/03/20 201 lb (91.2 kg)  07/31/19 205 lb (93 kg)     Physical Exam Constitutional:      General: He is not in acute distress.    Appearance: He is well-developed.  HENT:     Head: Normocephalic and atraumatic.     Right Ear: External  ear normal.     Left Ear: External ear normal.     Nose: Nose normal.  Eyes:     Conjunctiva/sclera: Conjunctivae normal.     Pupils: Pupils are equal, round, and reactive to light.  Cardiovascular:     Rate and Rhythm: Normal rate and regular rhythm.     Heart sounds: Normal heart sounds. No murmur heard.   Pulmonary:     Effort: Pulmonary effort is normal. No respiratory distress.     Breath sounds: Normal breath sounds. No wheezing or rales.  Abdominal:     Palpations: Abdomen is soft.     Tenderness: There is no  abdominal tenderness.  Musculoskeletal:        General: Normal range of motion.     Cervical back: Normal range of motion and neck supple.  Skin:    General: Skin is warm and dry.  Neurological:     Mental Status: He is alert and oriented to person, place, and time.     Deep Tendon Reflexes: Reflexes are normal and symmetric.  Psychiatric:        Behavior: Behavior normal.        Thought Content: Thought content normal.        Judgment: Judgment normal.     Lab Results  Component Value Date   HGBA1C 5.3 03/11/2018    Lab Results  Component Value Date   WBC 7.8 07/31/2019   HGB 16.3 07/31/2019   HCT 46.1 07/31/2019   PLT 196 07/31/2019   GLUCOSE 89 07/31/2019   CHOL 139 07/31/2019   TRIG 140 07/31/2019   HDL 49 07/31/2019   LDLCALC 66 07/31/2019   ALT 38 07/31/2019   AST 38 07/31/2019   NA 140 07/31/2019   K 4.6 07/31/2019   CL 102 07/31/2019   CREATININE 1.21 07/31/2019   BUN 11 07/31/2019   CO2 21 07/31/2019   TSH 3.380 07/31/2019   PSA 0.2 07/18/2013   INR 1.00 03/10/2018   HGBA1C 5.3 03/11/2018    DG Chest 2 View  Result Date: 03/28/2018 CLINICAL DATA:  Fever. Discharge from hospital 2 days ago with renal stents. Weakness and fever. EXAM: CHEST - 2 VIEW COMPARISON:  03/11/2018 FINDINGS: Heart size and pulmonary vascularity are normal. Linear fibrosis in the left lung base. Lungs are otherwise clear and expanded. No blunting of costophrenic angles. No pneumothorax. Mediastinal contours appear intact. Postoperative changes in the cervical spine. Degenerative changes in the thoracic spine. IMPRESSION: No active cardiopulmonary disease. Electronically Signed   By: Lucienne Capers M.D.   On: 03/28/2018 22:51    Assessment & Plan:   Conrado was seen today for medical management of chronic issues.  Diagnoses and all orders for this visit:  Chronic obstructive pulmonary disease, unspecified COPD type ([place]) -     CBC with Differential/Platelet -      CMP14+EGFR  Former smoker -     CBC with Differential/Platelet -     CMP14+EGFR  Benign prostatic hyperplasia, unspecified whether lower urinary tract symptoms present -     CBC with Differential/Platelet -     CMP14+EGFR -     PSA Total (Reflex To Free)  Hyperlipidemia, unspecified hyperlipidemia type -     CBC with Differential/Platelet -     CMP14+EGFR -     Lipid panel  Essential hypertension -     metoprolol succinate (TOPROL-XL) 50 MG 24 hr tablet; Take 1 tablet (50 mg total) by mouth daily. Take with or  immediately following a meal. -     CBC with Differential/Platelet -     CMP14+EGFR  Right sided weakness -     CBC with Differential/Platelet -     CMP14+EGFR  Vitamin D deficiency -     CBC with Differential/Platelet -     CMP14+EGFR -     VITAMIN D 25 Hydroxy (Vit-D Deficiency, Fractures)  Need for hepatitis C screening test -     CBC with Differential/Platelet -     CMP14+EGFR -     Hepatitis C antibody  Screening for prostate cancer -     PSA Total (Reflex To Free)  Other orders -     atorvastatin (LIPITOR) 80 MG tablet; TAKE 1 TABLET(80 MG) BY MOUTH DAILY AT 6 PM -     clopidogrel (PLAVIX) 75 MG tablet; TAKE 1 TABLET(75 MG) BY MOUTH DAILY -     Fluticasone-Umeclidin-Vilant (TRELEGY ELLIPTA) 100-62.5-25 MCG/INH AEPB; Inhale 1 puff into the lungs daily. -     halobetasol (ULTRAVATE) 0.05 % cream; Apply topically 2 (two) times daily.   I have discontinued Edmundo Tedesco "[name]"'s ofloxacin. I have also changed his Trelegy Ellipta. Additionally, I am having him start on halobetasol. Lastly, I am having him maintain his folic acid, thiamine, cyclobenzaprine, Baclofen, aspirin, brimonidine, prednisoLONE acetate, timolol, baclofen, atorvastatin, clopidogrel, and metoprolol succinate.  Meds ordered this encounter  Medications  . atorvastatin (LIPITOR) 80 MG tablet    Sig: TAKE 1 TABLET(80 MG) BY MOUTH DAILY AT 6 PM    Dispense:  90 tablet    Refill:  1  .  clopidogrel (PLAVIX) 75 MG tablet    Sig: TAKE 1 TABLET(75 MG) BY MOUTH DAILY    Dispense:  90 tablet    Refill:  1  . metoprolol succinate (TOPROL-XL) 50 MG 24 hr tablet    Sig: Take 1 tablet (50 mg total) by mouth daily. Take with or immediately following a meal.    Dispense:  90 tablet    Refill:  1  . Fluticasone-Umeclidin-Vilant (TRELEGY ELLIPTA) 100-62.5-25 MCG/INH AEPB    Sig: Inhale 1 puff into the lungs daily.    Dispense:  60 each    Refill:  5  . halobetasol (ULTRAVATE) 0.05 % cream    Sig: Apply topically 2 (two) times daily.    Dispense:  50 g    Refill:  5     Follow-up: Return in about 6 months (around 12/31/2020).  [name], M.D.

## 2020-07-03 NOTE — Addendum Note (Signed)
Addended by: [name] on: 07/03/2020 01:29 PM   Modules accepted: Orders

## 2020-07-04 ENCOUNTER — Ambulatory Visit: Payer: 59 | Admitting: Family Medicine

## 2020-07-04 ENCOUNTER — Other Ambulatory Visit: Payer: BC Managed Care – PPO

## 2020-07-04 DIAGNOSIS — N400 Enlarged prostate without lower urinary tract symptoms: Secondary | ICD-10-CM | POA: Diagnosis not present

## 2020-07-04 DIAGNOSIS — Z87891 Personal history of nicotine dependence: Secondary | ICD-10-CM | POA: Diagnosis not present

## 2020-07-04 DIAGNOSIS — Z1159 Encounter for screening for other viral diseases: Secondary | ICD-10-CM | POA: Diagnosis not present

## 2020-07-04 DIAGNOSIS — E559 Vitamin D deficiency, unspecified: Secondary | ICD-10-CM | POA: Diagnosis not present

## 2020-07-04 DIAGNOSIS — I10 Essential (primary) hypertension: Secondary | ICD-10-CM | POA: Diagnosis not present

## 2020-07-04 DIAGNOSIS — J449 Chronic obstructive pulmonary disease, unspecified: Secondary | ICD-10-CM | POA: Diagnosis not present

## 2020-07-04 DIAGNOSIS — E785 Hyperlipidemia, unspecified: Secondary | ICD-10-CM | POA: Diagnosis not present

## 2020-07-04 DIAGNOSIS — Z125 Encounter for screening for malignant neoplasm of prostate: Secondary | ICD-10-CM | POA: Diagnosis not present

## 2020-07-05 LAB — CMP14+EGFR
ALT: 28 IU/L (ref 0–44)
AST: 22 IU/L (ref 0–40)
Albumin/Globulin Ratio: 2.2 (ref 1.2–2.2)
Albumin: 4.4 g/dL (ref 3.8–4.8)
Alkaline Phosphatase: 81 IU/L (ref 48–121)
BUN/Creatinine Ratio: 10 (ref 10–24)
BUN: 11 mg/dL (ref 8–27)
Bilirubin Total: 1.7 mg/dL — ABNORMAL HIGH (ref 0.0–1.2)
CO2: 22 mmol/L (ref 20–29)
Calcium: 8.9 mg/dL (ref 8.6–10.2)
Chloride: 101 mmol/L (ref 96–106)
Creatinine, Ser: 1.12 mg/dL (ref 0.76–1.27)
GFR calc Af Amer: 80 mL/min/{1.73_m2} (ref >59)
GFR calc non Af Amer: 69 mL/min/{1.73_m2} (ref >59)
Globulin, Total: 2 g/dL (ref 1.5–4.5)
Glucose: 112 mg/dL — ABNORMAL HIGH (ref 65–99)
Potassium: 4.8 mmol/L (ref 3.5–5.2)
Sodium: 138 mmol/L (ref 134–144)
Total Protein: 6.4 g/dL (ref 6.0–8.5)

## 2020-07-05 LAB — CBC WITH DIFFERENTIAL/PLATELET
Basophils Absolute: 0 10*3/uL (ref 0.0–0.2)
Basos: 1 %
EOS (ABSOLUTE): 0.1 10*3/uL (ref 0.0–0.4)
Eos: 2 %
Hematocrit: 42.5 % (ref 37.5–51.0)
Hemoglobin: 15.2 g/dL (ref 13.0–17.7)
Immature Grans (Abs): 0 10*3/uL (ref 0.0–0.1)
Immature Granulocytes: 0 %
Lymphocytes Absolute: 1.2 10*3/uL (ref 0.7–3.1)
Lymphs: 19 %
MCH: 35.6 pg — ABNORMAL HIGH (ref 26.6–33.0)
MCHC: 35.8 g/dL — ABNORMAL HIGH (ref 31.5–35.7)
MCV: 100 fL — ABNORMAL HIGH (ref 79–97)
Monocytes Absolute: 0.7 10*3/uL (ref 0.1–0.9)
Monocytes: 12 %
Neutrophils Absolute: 4.3 10*3/uL (ref 1.4–7.0)
Neutrophils: 66 %
Platelets: 156 10*3/uL (ref 150–450)
RBC: 4.27 x10E6/uL (ref 4.14–5.80)
RDW: 13 % (ref 11.6–15.4)
WBC: 6.4 10*3/uL (ref 3.4–10.8)

## 2020-07-05 LAB — HEPATITIS C ANTIBODY: Hep C Virus Ab: <0.1 s/co ratio (ref 0.0–0.9)

## 2020-07-05 LAB — LIPID PANEL
Chol/HDL Ratio: 2.6 ratio (ref 0.0–5.0)
Cholesterol, Total: 137 mg/dL (ref 100–199)
HDL: 52 mg/dL (ref >39)
LDL Chol Calc (NIH): 70 mg/dL (ref 0–99)
Triglycerides: 75 mg/dL (ref 0–149)
VLDL Cholesterol Cal: 15 mg/dL (ref 5–40)

## 2020-07-05 LAB — PSA TOTAL (REFLEX TO FREE): Prostate Specific Ag, Serum: 0.3 ng/mL (ref 0.0–4.0)

## 2020-07-05 LAB — VITAMIN D 25 HYDROXY (VIT D DEFICIENCY, FRACTURES): Vit D, 25-Hydroxy: 48 ng/mL (ref 30.0–100.0)

## 2020-07-07 NOTE — Progress Notes (Signed)
Hello Daniel [name],  Your lab result is normal and/or stable.Some minor variations that are not significant are commonly marked abnormal, but do not represent any medical problem for you.  Best regards, Shakora Nordquist, M.D.

## 2020-07-29 ENCOUNTER — Other Ambulatory Visit: Payer: Self-pay | Admitting: Adult Health

## 2020-07-29 DIAGNOSIS — R252 Cramp and spasm: Secondary | ICD-10-CM

## 2020-07-29 DIAGNOSIS — M62838 Other muscle spasm: Secondary | ICD-10-CM

## 2020-10-03 ENCOUNTER — Encounter: Payer: Self-pay | Admitting: Adult Health

## 2020-10-03 ENCOUNTER — Ambulatory Visit: Payer: BC Managed Care – PPO | Admitting: Adult Health

## 2020-10-03 VITALS — BP 110/69 | HR 64 | Ht 70.0 in | Wt 207.0 lb

## 2020-10-03 DIAGNOSIS — Z8673 Personal history of transient ischemic attack (TIA), and cerebral infarction without residual deficits: Secondary | ICD-10-CM | POA: Diagnosis not present

## 2020-10-03 DIAGNOSIS — E785 Hyperlipidemia, unspecified: Secondary | ICD-10-CM | POA: Diagnosis not present

## 2020-10-03 DIAGNOSIS — J449 Chronic obstructive pulmonary disease, unspecified: Secondary | ICD-10-CM | POA: Diagnosis not present

## 2020-10-03 DIAGNOSIS — F1090 Alcohol use, unspecified, uncomplicated: Secondary | ICD-10-CM

## 2020-10-03 DIAGNOSIS — Z789 Other specified health status: Secondary | ICD-10-CM

## 2020-10-03 DIAGNOSIS — Z7289 Other problems related to lifestyle: Secondary | ICD-10-CM | POA: Diagnosis not present

## 2020-10-03 DIAGNOSIS — M62838 Other muscle spasm: Secondary | ICD-10-CM

## 2020-10-03 MED ORDER — BACLOFEN 10 MG PO TABS: 10.0000 mg | ORAL_TABLET | Freq: Three times a day (TID) | ORAL | 3 refills | Status: DC

## 2020-10-03 NOTE — Patient Instructions (Signed)
Your Plan:  Continue baclofen 10mg  3 times daily for cramping  We will check B12, folate and homocysteine level   Continue plavix and atorvastatin for stroke prevention    Follow up in 1 year or call earlier if needed     Thank you for coming to see [place] at [facility]. I hope we have been able to provide you high quality care today.  You may receive a patient satisfaction survey over the next few weeks. We would appreciate your feedback and comments so that we may continue to improve ourselves and the health of our patients.

## 2020-10-03 NOTE — Progress Notes (Signed)
I agree with the above plan 

## 2020-10-03 NOTE — Progress Notes (Signed)
[facility] [address] [place]. [place] [address] [phone]       OFFICE FOLLOW UP NOTE  [name] Date of Birth:  May 28, 1956 Medical Record Number:  [id number]   Reason for Referral: stroke follow up  CHIEF COMPLAINT:  Chief Complaint  Patient presents with  . Stroke follow up    Pt is not having new sx.  . RM 5    HPI:   Today, 10/03/2020, [name] returns for 15-month follow-up.  Stable from stroke standpoint continuing on Plavix and atorvastatin 80 mg daily.  Blood pressure today 110/69.  Chronic lower extremity and hand cramping/spasms relatively stable.  On baclofen 10 mg 3 times daily as needed tolerating without side effects.  Reports recently decreasing alcohol intake previously 9oz/day bourbon currently 1 "drink" every 2-3 nights and increasing water intake.  Previously checked lab work for reversible causes which were all satisfactory.  No further concerns at this time.    History provided for reference purposes only Update 04/03/20 JM: [name] is being seen for follow up regarding history of stroke and chronic bilateral hand and feet cramping.  He has been stable from a stroke standpoint without new or reoccurring stroke/TIA symptoms.  He continues on clopidogrel 75 mg daily and atorvastatin 80 mg daily for secondary stroke prevention.  Blood pressure today 107/67.  Continues to follow closely with PCP for HTN and HLD management. Chronic muscle spasms of bilateral feet and hands which have been present since 2014.  Endorses sensation of cramping and tightness.  Continues on baclofen 5 mg 3 times daily as needed initially experiencing great benefit and reduction of cramping frequency but recently he has not been experiencing as much benefit.  Symptoms have been relatively stable without worsening.  Hand cramping typically occurs after increased activity that requires frequent use of hands.  Bilateral foot cramping typically only  present during the night.  He is currently on statin therapy but symptoms present prior to initiating statin.  He does endorse occasional joint pain but denies numbness/tingling or weakness.  History provided for reference purposes only 06/05/2019 update: [name] is a 64 year old male who is being seen today for stroke follow-up.  He has been stable from a stroke standpoint.  He continues on baclofen with continued benefit of muscle spasms.  Continues on clopidogrel and atorvastatin for secondary stroke prevention without reported side effects.  Blood pressure stable 133/77.  Denies new or worsening stroke/TIA symptoms.  12/05/2018 update: [name] is being seen today for stroke follow up visit.  Overall, patient has been stable from a stroke standpoint.  He continues on Plavix without side effects of bleeding or bruising.  Continues on atorvastatin without side effects myalgias.  He did have lipid panel obtained on 09/10/2018 which showed LDL 42.  Blood pressure today 134/74.  He continues on baclofen 5 mg 3 times daily for lower extremity spasms with benefit.  He occasionally will use Flexeril 5 mg for breakthrough spasms.  He does have concerns today of weight gain and is questioning whether a specific medication could be doing this.  Denies new or worsening stroke/TIA symptoms.  06/01/18 UPDATE: Patient is being seen today for routine follow-up appointment.  Patient did undergo EMG/nerve conduction study but only showed very mild bilateral lower extremity neuropathy.  Patient continues to take baclofen twice daily which has greatly reduced his muscle spasms.  He states on occasion he may have increase spasms during the day in between doses but overall  feels as though these have greatly improved.  He has continued to tolerate baclofen well without side effects.  Does have complaints of continued dizziness for which she has had for the past 5 years that occurs with rapid movement or position changes  and lasts only seconds.  Patient was provided with BPPV home exercises as he is declining PT at this time.  Continues to take Plavix without side effects of bleeding or bruising.  Continues to take Lipitor without side effects myalgias.  Blood pressure today satisfactory 133/74.  Denies new or worsening stroke/TIA symptoms.  Initial visit 03/30/2018 JM: Patient is being seen today for hospital follow-up visit and is accompanied by his wife.  All symptoms have resolved and he is back to doing all normal activities.  He does complain of daily headaches since his stroke.  Denies phonophobia or nausea vomiting but does endorse photophobia.  He has been taking extra strength Tylenol which helps relieve these headaches.  They are not debilitating as he can continue to do certain activities but also states he has not been as active due to recent renal stent placement.  Patient also complains of muscle cramps that he states is been occurring for the past 5 years where his muscles will contract and become very painfu.  This typically happens when patient has increased fatigue or with increased activity.  They are also worse in the evening or with cold temperature.  He has recently been taking Flexeril without much relief.  Patient states he has had multiple blood tests in the past by primary providers but all negative.  Continues to take both aspirin and Plavix without side effects of bleeding or bruising.  Continues to take Lipitor without increased side effects of myalgias.  Blood pressure today at beginning of visit 153/93 and at recheck at end of visit 102/60.  States he has not smoked cigarettes since hospital discharge.  Denies new or worsening stroke/TIA symptoms.   Stroke admission 03/10/2018: [name] is a 64 y.o. male with history of pulmonary nodule, ongoing tobacco use, ETOH use, hypertension, hyperlipidemia, COPD, CKD with likely renal artery stenosis presenting with intermittent right-sided  weakness. He did not receive IV t-PA due to resolution of deficits and late presentation. However, at lunch time, his right sided weakness returned. CT head reviewed and showed chronic stable mild to moderate small vessel ischemic disease with no significant change from prior imaging.  Initial MRI showed no acute intercranial abnormality.  Repeat MRI did show acute/early subacute infarctions within the left posterior limb of internal capsule and right posteromedial thalamus.  Carotid Dopplers are unremarkable.  2D echo showed an EF of 60 to 65%.  Patient was not on antithrombotic prior to admission but due to concerns of capsular warning syndrome, he was loaded with aspirin and Plavix along with Lipitor 80 mg and recommended to continue DAPT for 3 weeks and then Plavix alone.  LDL 134 and recommended to continue Lipitor 80 mg at discharge.  Therapies recommended CIR placement.      ROS:   14 system review of systems performed and negative with exception of muscle spasm/cramping  PMH:  Past Medical History:  Diagnosis Date  . Acute ischemic stroke ([place]) 03/12/2018  . BPH (benign prostatic hyperplasia)   . COPD (chronic obstructive pulmonary disease) ([place])   . Hyperlipidemia   . Hypertension   . Pulmonary nodule   . renal stents   . Stroke ([place])   . TIA (transient ischemic attack) 03/11/2018  PSH:  Past Surgical History:  Procedure Laterality Date  . CERVICAL FUSION    . NO PAST SURGERIES    . renal stents      Social History:  Social History   Socioeconomic History  . Marital status: Married    Spouse name: Not on file  . Number of children: Not on file  . Years of education: Not on file  . Highest education level: Not on file  Occupational History  . Occupation: Museum/gallery curator  Tobacco Use  . Smoking status: Former Smoker    Packs/day: 1.00    Years: 42.00    Pack years: 42.00    Types: Cigarettes  . Smokeless tobacco: Never Used  Substance and Sexual Activity  . Alcohol use:  Never    Comment: not dirnking now  . Drug use: Never  . Sexual activity: Not on file  Other Topics Concern  . Not on file  Social History Narrative  . Not on file   Social Determinants of Health   Financial Resource Strain:   . Difficulty of Paying Living Expenses: Not on file  Food Insecurity:   . Worried About Charity fundraiser in the Last Year: Not on file  . Ran Out of Food in the Last Year: Not on file  Transportation Needs:   . Lack of Transportation (Medical): Not on file  . Lack of Transportation (Non-Medical): Not on file  Physical Activity:   . Days of Exercise per Week: Not on file  . Minutes of Exercise per Session: Not on file  Stress:   . Feeling of Stress : Not on file  Social Connections:   . Frequency of Communication with Friends and Family: Not on file  . Frequency of Social Gatherings with Friends and Family: Not on file  . Attends Religious Services: Not on file  . Active Member of Clubs or Organizations: Not on file  . Attends Archivist Meetings: Not on file  . Marital Status: Not on file  Intimate Partner Violence:   . Fear of Current or Ex-Partner: Not on file  . Emotionally Abused: Not on file  . Physically Abused: Not on file  . Sexually Abused: Not on file    Family History:  Family History  Problem Relation Age of Onset  . Emphysema Father   . Cancer Father   . Lung cancer Mother     Medications:   Current Outpatient Medications on File Prior to Visit  Medication Sig Dispense Refill  . aspirin 81 MG EC tablet Take by mouth.    Marland Kitchen atorvastatin (LIPITOR) 80 MG tablet TAKE 1 TABLET(80 MG) BY MOUTH DAILY AT 6 PM 90 tablet 1  . brimonidine (ALPHAGAN) 0.2 % ophthalmic solution Place 1 drop into both eyes 3 (three) times a day.    . clopidogrel (PLAVIX) 75 MG tablet TAKE 1 TABLET(75 MG) BY MOUTH DAILY 90 tablet 1  . cyclobenzaprine (FLEXERIL) 5 MG tablet Take 5 mg by mouth at bedtime as needed for muscle spasms.     .  Fluticasone-Umeclidin-Vilant (TRELEGY ELLIPTA) 100-62.5-25 MCG/INH AEPB Inhale 1 puff into the lungs daily. 60 each 5  . folic acid (FOLVITE) 1 MG tablet Take 1 tablet (1 mg total) by mouth daily. 30 tablet 0  . halobetasol (ULTRAVATE) 0.05 % cream Apply topically 2 (two) times daily. 50 g 5  . metoprolol succinate (TOPROL-XL) 50 MG 24 hr tablet Take 1 tablet (50 mg total) by mouth daily. Take with or immediately  following a meal. 90 tablet 1   No current facility-administered medications on file prior to visit.    Allergies:  No Known Allergies   Physical Exam  Vitals:   10/03/20 1507  BP: 110/69  Pulse: 64  Weight: 207 lb (93.9 kg)  Height: 5\' 10"  (1.778 m)   Body mass index is 29.7 kg/m. No exam data present  General: Pleasant middle-aged Caucasian male, seated, in no evident distress Head: head normocephalic and atraumatic.   Neck: supple with no carotid or supraclavicular bruits Cardiovascular: regular rate and rhythm, no murmurs Musculoskeletal: no deformity Skin:  no rash/petichiae Vascular:  Normal pulses all extremities  Neurologic Exam Mental Status: Awake and fully alert.  Fluent speech and language.  Oriented to place and time. Recent and remote memory intact. Attention span, concentration and fund of knowledge appropriate. Mood and affect appropriate.  Cranial Nerves: Pupils equal, briskly reactive to light. Extraocular movements full without nystagmus. Visual fields full to confrontation. Hearing intact. Facial sensation intact. Face, tongue, palate moves normally and symmetrically.  Motor: Normal bulk and tone. Normal strength in all tested extremity muscles. Sensory.: intact to touch , pinprick , position and vibratory sensation.  Coordination: Rapid alternating movements normal in all extremities. Finger-to-nose and heel-to-shin performed accurately bilaterally. Gait and Station: Arises from chair without difficulty. Stance is normal. Gait demonstrates normal  stride length and balance . Able to heel, toe and tandem walk without difficulty.  Reflexes: 1+ and symmetric. Toes downgoing.        ASSESSMENT/PLAN: Daniel [name] is a 64 y.o. year old male here with left internal capsule and right thalamus on 03/10/2018 secondary to small vessel disease recovered well without residual deficits. Vascular risk factors include HTN, HLD and smoking.  Chronic bilateral foot and hand spasms/cramping    1. Stroke: -Continue clopidogrel 75 mg daily and Lipitor for secondary stroke prevention -F/u with PCP regarding your HLD and HTN management -Maintain strict control of hypertension with blood pressure goal below 130/90 and cholesterol with LDL cholesterol (bad cholesterol) goal below 70 mg/dL.  2. Muscle cramping/spasms: -Chronic with unknown etiology -Continue baclofen dosage to 10 mg 3 times daily as needed -Encouraged continued increase of fluid intake  3. Excessive alcohol use -Congratulated on decreasing amt and encouraged to continue -obtain homocysteine level, b12 and folate level for secondary stroke prevention   Follow-up in 1 year or call earlier if needed   CC:  [facility] provider: Dr. Sharlene Motts, Cletus Gash, MD    I spent 30 minutes of face-to-face and non-face-to-face time with patient.  This included previsit chart review, lab review, study review, order entry, electronic health record documentation, patient education and discussion regarding history of stroke and importance of managing stroke risk factors, chronic muscle cramping/spasms, excessive alcohol use and further lab work for secondary stroke prevention and answered all other questions to patient satisfaction   Venancio Poisson, [facility]  [facility] [address] [place], [place] [address]  Phone [phone] Fax [phone]

## 2020-10-04 LAB — VITAMIN B12: Vitamin B-12: 667 pg/mL (ref 232–1245)

## 2020-10-04 LAB — HOMOCYSTEINE: Homocysteine: 12.2 umol/L (ref 0.0–17.2)

## 2020-10-04 LAB — FOLATE: Folate: 14.6 ng/mL (ref >3.0)

## 2020-10-07 ENCOUNTER — Telehealth: Payer: Self-pay

## 2020-10-07 NOTE — Telephone Encounter (Signed)
Daniel [name] is a 64 y.o. male was contacted and notified of the message below. Pt verbalized understandinf

## 2020-10-07 NOTE — Telephone Encounter (Signed)
-----   Message from Frann Rider, NP sent at 10/07/2020  8:15 AM EST ----- Please advise patient recent lab work all satisfactory and to continue current treatment regimen

## 2020-11-21 ENCOUNTER — Other Ambulatory Visit: Payer: Self-pay | Admitting: Family Medicine

## 2020-11-22 ENCOUNTER — Other Ambulatory Visit: Payer: Self-pay | Admitting: Family Medicine

## 2020-11-22 DIAGNOSIS — I10 Essential (primary) hypertension: Secondary | ICD-10-CM

## 2020-11-22 MED ORDER — METOPROLOL SUCCINATE ER 50 MG PO TB24: 50.0000 mg | ORAL_TABLET | Freq: Every day | ORAL | 0 refills | Status: DC

## 2020-11-22 NOTE — Addendum Note (Signed)
Addended by: Antonietta Barcelona D on: 11/22/2020 09:57 AM   Modules accepted: Orders

## 2020-12-23 DIAGNOSIS — I150 Renovascular hypertension: Secondary | ICD-10-CM | POA: Diagnosis not present

## 2020-12-23 DIAGNOSIS — I701 Atherosclerosis of renal artery: Secondary | ICD-10-CM | POA: Diagnosis not present

## 2020-12-23 DIAGNOSIS — M25572 Pain in left ankle and joints of left foot: Secondary | ICD-10-CM | POA: Diagnosis not present

## 2020-12-23 DIAGNOSIS — M25571 Pain in right ankle and joints of right foot: Secondary | ICD-10-CM | POA: Diagnosis not present

## 2020-12-23 DIAGNOSIS — Z95828 Presence of other vascular implants and grafts: Secondary | ICD-10-CM | POA: Diagnosis not present

## 2020-12-25 ENCOUNTER — Other Ambulatory Visit: Payer: Self-pay | Admitting: Family Medicine

## 2020-12-31 ENCOUNTER — Other Ambulatory Visit: Payer: Self-pay

## 2020-12-31 ENCOUNTER — Ambulatory Visit: Payer: BC Managed Care – PPO | Admitting: Family Medicine

## 2020-12-31 ENCOUNTER — Encounter: Payer: Self-pay | Admitting: Family Medicine

## 2020-12-31 VITALS — BP 119/75 | HR 73 | Temp 97.2°F | Resp 20 | Ht 70.0 in | Wt 202.1 lb

## 2020-12-31 DIAGNOSIS — Z23 Encounter for immunization: Secondary | ICD-10-CM | POA: Diagnosis not present

## 2020-12-31 DIAGNOSIS — I10 Essential (primary) hypertension: Secondary | ICD-10-CM

## 2020-12-31 DIAGNOSIS — N400 Enlarged prostate without lower urinary tract symptoms: Secondary | ICD-10-CM

## 2020-12-31 DIAGNOSIS — J449 Chronic obstructive pulmonary disease, unspecified: Secondary | ICD-10-CM

## 2020-12-31 DIAGNOSIS — E785 Hyperlipidemia, unspecified: Secondary | ICD-10-CM

## 2020-12-31 MED ORDER — CIPROFLOXACIN HCL 500 MG PO TABS: 500.0000 mg | ORAL_TABLET | Freq: Two times a day (BID) | ORAL | 0 refills | Status: DC

## 2020-12-31 MED ORDER — TRELEGY ELLIPTA 100-62.5-25 MCG/INH IN AEPB: 1.0000 | INHALATION_SPRAY | Freq: Every day | RESPIRATORY_TRACT | 3 refills | Status: DC

## 2020-12-31 NOTE — Progress Notes (Signed)
Subjective:  Patient ID: Daniel [name], male    DOB: May 06, 1956  Age: 65 y.o. MRN: [id number]  CC: Medical Management of Chronic Issues   HPI Daniel [name] presents for  presents for  follow-up of hypertension. Patient has no history of headache chest pain or shortness of breath or recent cough. Patient also denies symptoms of TIA such as focal numbness or weakness. Patient denies side effects from medication. States taking it regularly.   in for follow-up of elevated cholesterol. Doing well without complaints on current medication. Denies side effects of statin including myalgia and arthralgia and nausea. Currently no chest pain, shortness of breath or other cardiovascular related symptoms noted. 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  He brings in his negative Covid test taken at the time of his departure.  He is having some diarrhea.  He has had this before it is typical enteritis that it occurs when he travels in the region.  Trelegy working well.  He can go about his daily routine without shortness of breath.  However excessive exertion can still make him short of breath.  He uses his rescue inhaler infrequently. Depression screen [facility] 2/9 12/31/2020 07/03/2020 07/31/2019  Decreased Interest 0 0 0  Down, Depressed, Hopeless 0 0 0  PHQ - 2 Score 0 0 0    History Daniel [name] has a past medical history of Acute ischemic stroke ([place]) (03/12/2018), BPH (benign prostatic hyperplasia), COPD (chronic obstructive pulmonary disease) ([place]), Hyperlipidemia, Hypertension, Pulmonary nodule, renal stents, Stroke ([place]), and TIA (transient ischemic attack) (03/11/2018).   He has a past surgical history that includes No past surgeries; renal stents; and Cervical fusion.   His family history includes Cancer in his father; Emphysema in his father; Lung cancer in his mother.He reports that he has quit smoking. His smoking use included cigarettes. He has a  42.00 pack-year smoking history. He has never used smokeless tobacco. He reports that he does not drink alcohol and does not use drugs.    ROS Review of Systems  Constitutional: Negative for fever.  Respiratory: Negative for shortness of breath.   Cardiovascular: Negative for chest pain.  Gastrointestinal: Positive for abdominal pain (cramping is terrible.) and diarrhea (watery BM "Ice Tea" 3-7X a day for 4 days).  Musculoskeletal: Negative for arthralgias.  Skin: Negative for rash.    Objective:  BP 119/75   Pulse 73   Temp (!) 97.2 F (36.2 C) (Temporal)   Resp 20   Ht _0  (1.778 m)   Wt 202 lb 2 oz (91.7 kg)   SpO2 97%   BMI 29.00 kg/m   BP Readings from Last 3 Encounters:  12/31/20 119/75  10/03/20 110/69  07/03/20 116/70    Wt Readings from Last 3 Encounters:  12/31/20 202 lb 2 oz (91.7 kg)  10/03/20 207 lb (93.9 kg)  07/03/20 202 lb 8 oz (91.9 kg)     Physical Exam Vitals reviewed.  Constitutional:      Appearance: He is well-developed and well-nourished.  HENT:     Head: Normocephalic and atraumatic.     Right Ear: Tympanic membrane and external ear normal. No decreased hearing noted.     Left Ear: Tympanic membrane and external ear normal. No decreased hearing noted.     Mouth/Throat:     Pharynx: No oropharyngeal exudate or posterior oropharyngeal erythema.  Eyes:     Pupils: Pupils are equal, round, and reactive to light.  Cardiovascular:  Rate and Rhythm: Normal rate and regular rhythm.     Heart sounds: No murmur heard.   Pulmonary:     Effort: No respiratory distress.     Breath sounds: Normal breath sounds.  Abdominal:     General: Bowel sounds are normal.     Palpations: Abdomen is soft. There is no mass.     Tenderness: There is abdominal tenderness (  Mild diffuse).  Musculoskeletal:     Cervical back: Normal range of motion and neck supple.       Assessment & Plan:   Daniel [name] was seen today for medical management of  chronic issues.  Diagnoses and all orders for this visit:  Primary hypertension -     CBC with Differential/Platelet -     CMP14+EGFR -     Lipid panel  Hyperlipidemia, unspecified hyperlipidemia type -     CBC with Differential/Platelet -     CMP14+EGFR -     Lipid panel  Benign prostatic hyperplasia, unspecified whether lower urinary tract symptoms present -     CBC with Differential/Platelet -     CMP14+EGFR -     Lipid panel  Chronic obstructive pulmonary disease, unspecified COPD type ([place]) -     CBC with Differential/Platelet -     CMP14+EGFR -     Lipid panel  Other orders -     Tdap vaccine greater than or equal to 7yo IM -     ciprofloxacin (CIPRO) 500 MG tablet; Take 1 tablet (500 mg total) by mouth 2 (two) times daily. -     Fluticasone-Umeclidin-Vilant (TRELEGY ELLIPTA) 100-62.5-25 MCG/INH AEPB; Inhale 1 puff into the lungs daily.       I have discontinued Daniel Melchior "[name]"'s folic acid. I am also having him start on ciprofloxacin. Additionally, I am having him maintain his cyclobenzaprine, aspirin, brimonidine, halobetasol, baclofen, metoprolol succinate, clopidogrel, atorvastatin, SUPER B COMPLEX/C PO, glucosamine-chondroitin, Vitamin D, and Trelegy Ellipta.  Allergies as of 12/31/2020   No Known Allergies     Medication List       Accurate as of December 31, 2020  8:56 PM. If you have any questions, ask your nurse or doctor.        STOP taking these medications   folic acid 1 MG tablet Commonly known as: FOLVITE Stopped by: [name], MD     TAKE these medications   aspirin 81 MG EC tablet Take by mouth.   atorvastatin 80 MG tablet Commonly known as: LIPITOR TAKE 1 TABLET(80 MG) BY MOUTH DAILY AT 6 PM   baclofen 10 MG tablet Commonly known as: LIORESAL Take 1 tablet (10 mg total) by mouth 3 (three) times daily.   brimonidine 0.2 % ophthalmic solution Commonly known as: ALPHAGAN Place 1 drop into both eyes 3 (three) times a day.    ciprofloxacin 500 MG tablet Commonly known as: Cipro Take 1 tablet (500 mg total) by mouth 2 (two) times daily. Started by: [name], MD   clopidogrel 75 MG tablet Commonly known as: PLAVIX TAKE 1 TABLET(75 MG) BY MOUTH DAILY   cyclobenzaprine 5 MG tablet Commonly known as: FLEXERIL Take 5 mg by mouth at bedtime as needed for muscle spasms.   glucosamine-chondroitin 500-400 MG tablet Take 1 tablet by mouth 3 (three) times daily.   halobetasol 0.05 % cream Commonly known as: Ultravate Apply topically 2 (two) times daily.   metoprolol succinate 50 MG 24 hr tablet Commonly known as: TOPROL-XL Take 1 tablet (50 mg  total) by mouth daily. Take with or immediately following a meal.   SUPER B COMPLEX/C PO Take by mouth.   Trelegy Ellipta 100-62.5-25 MCG/INH Aepb Generic drug: Fluticasone-Umeclidin-Vilant Inhale 1 puff into the lungs daily.   Vitamin D 50 MCG (2000 UT) Caps Take by mouth.        Follow-up: Return in about 6 months (around 07/03/2021) for [employer] physical- Welcome to Medicare.  [name], M.D.

## 2021-01-01 ENCOUNTER — Telehealth: Payer: Self-pay | Admitting: *Deleted

## 2021-01-01 LAB — CBC WITH DIFFERENTIAL/PLATELET
Basophils Absolute: 0 10*3/uL (ref 0.0–0.2)
Basos: 0 %
EOS (ABSOLUTE): 0.1 10*3/uL (ref 0.0–0.4)
Eos: 2 %
Hematocrit: 45 % (ref 37.5–51.0)
Hemoglobin: 15.1 g/dL (ref 13.0–17.7)
Immature Grans (Abs): 0 10*3/uL (ref 0.0–0.1)
Immature Granulocytes: 0 %
Lymphocytes Absolute: 1 10*3/uL (ref 0.7–3.1)
Lymphs: 18 %
MCH: 31 pg (ref 26.6–33.0)
MCHC: 33.6 g/dL (ref 31.5–35.7)
MCV: 92 fL (ref 79–97)
Monocytes Absolute: 0.7 10*3/uL (ref 0.1–0.9)
Monocytes: 12 %
Neutrophils Absolute: 3.7 10*3/uL (ref 1.4–7.0)
Neutrophils: 68 %
Platelets: 183 10*3/uL (ref 150–450)
RBC: 4.87 x10E6/uL (ref 4.14–5.80)
RDW: 12.9 % (ref 11.6–15.4)
WBC: 5.5 10*3/uL (ref 3.4–10.8)

## 2021-01-01 LAB — CMP14+EGFR
ALT: 17 IU/L (ref 0–44)
AST: 23 IU/L (ref 0–40)
Albumin/Globulin Ratio: 2 (ref 1.2–2.2)
Albumin: 4.5 g/dL (ref 3.8–4.8)
Alkaline Phosphatase: 98 IU/L (ref 44–121)
BUN/Creatinine Ratio: 8 — ABNORMAL LOW (ref 10–24)
BUN: 9 mg/dL (ref 8–27)
Bilirubin Total: 1.8 mg/dL — ABNORMAL HIGH (ref 0.0–1.2)
CO2: 19 mmol/L — ABNORMAL LOW (ref 20–29)
Calcium: 9.2 mg/dL (ref 8.6–10.2)
Chloride: 103 mmol/L (ref 96–106)
Creatinine, Ser: 1.2 mg/dL (ref 0.76–1.27)
Globulin, Total: 2.2 g/dL (ref 1.5–4.5)
Glucose: 110 mg/dL — ABNORMAL HIGH (ref 65–99)
Potassium: 4.3 mmol/L (ref 3.5–5.2)
Sodium: 142 mmol/L (ref 134–144)
Total Protein: 6.7 g/dL (ref 6.0–8.5)
eGFR: 68 mL/min/{1.73_m2} (ref >59)

## 2021-01-01 LAB — LIPID PANEL
Chol/HDL Ratio: 2.8 ratio (ref 0.0–5.0)
Cholesterol, Total: 103 mg/dL (ref 100–199)
HDL: 37 mg/dL — ABNORMAL LOW (ref >39)
LDL Chol Calc (NIH): 48 mg/dL (ref 0–99)
Triglycerides: 90 mg/dL (ref 0–149)
VLDL Cholesterol Cal: 18 mg/dL (ref 5–40)

## 2021-01-01 NOTE — Telephone Encounter (Signed)
PA not needed. Pharmacy states it was where it was trying to be filled to soon.

## 2021-01-01 NOTE — Telephone Encounter (Signed)
PA in process  (Key: [id number]) Rx #: [id number] Trelegy Ellipta 100-62.5-25MCG/INH aerosol powde

## 2021-01-03 NOTE — Progress Notes (Signed)
Hello Manasseh,  Your lab result is normal and/or stable.Some minor variations that are not significant are commonly marked abnormal, but do not represent any medical problem for you.  Best regards, Drystan Reader, M.D.

## 2021-01-08 NOTE — Telephone Encounter (Signed)
New PA came in and we have resubmitted pa for trelegy.  (Key: [id number])  Your information has been submitted to [place]. [employer] [place] will review the request and notify you of the determination decision directly, typically within 72 hours of receiving all information.  You will also receive your request decision electronically. To check for an update later, open this request again from your dashboard.  If [facility] has not responded within the specified timeframe or if you have any questions about your PA submission, contact [place]  directly at [phone].

## 2021-01-10 NOTE — Telephone Encounter (Signed)
PA not needed. Pharmacy will fill.

## 2021-01-15 MED ORDER — TRELEGY ELLIPTA 100-62.5-25 MCG/INH IN AEPB: 1.0000 | INHALATION_SPRAY | Freq: Every day | RESPIRATORY_TRACT | 3 refills | Status: DC

## 2021-01-15 NOTE — Telephone Encounter (Signed)
This request has received a Cancelled outcome.  This may mean either your patient does not have active coverage with this plan, this authorization was processed as a duplicate request, or an authorization was not needed for this medication.  Note any additional information provided by Sherre Poot [place] at the bottom of this request, and contact [employer] [place] directly for further   Pharmacy aware

## 2021-01-15 NOTE — Telephone Encounter (Signed)
PA sent for trelegy  Key: [id number]

## 2021-01-15 NOTE — Addendum Note (Signed)
Addended by: Thana Ates on: 01/15/2021 11:52 AM   Modules accepted: Orders

## 2021-02-01 ENCOUNTER — Other Ambulatory Visit: Payer: Self-pay | Admitting: Family Medicine

## 2021-02-01 DIAGNOSIS — I10 Essential (primary) hypertension: Secondary | ICD-10-CM

## 2021-02-14 ENCOUNTER — Other Ambulatory Visit: Payer: Self-pay | Admitting: Family Medicine

## 2021-02-20 DIAGNOSIS — L720 Epidermal cyst: Secondary | ICD-10-CM | POA: Diagnosis not present

## 2021-02-20 DIAGNOSIS — B351 Tinea unguium: Secondary | ICD-10-CM | POA: Diagnosis not present

## 2021-02-20 DIAGNOSIS — L409 Psoriasis, unspecified: Secondary | ICD-10-CM | POA: Diagnosis not present

## 2021-02-20 DIAGNOSIS — L570 Actinic keratosis: Secondary | ICD-10-CM | POA: Diagnosis not present

## 2021-02-20 DIAGNOSIS — D3610 Benign neoplasm of peripheral nerves and autonomic nervous system, unspecified: Secondary | ICD-10-CM | POA: Diagnosis not present

## 2021-02-20 DIAGNOSIS — B356 Tinea cruris: Secondary | ICD-10-CM | POA: Diagnosis not present

## 2021-02-20 DIAGNOSIS — Z79899 Other long term (current) drug therapy: Secondary | ICD-10-CM | POA: Diagnosis not present

## 2021-03-25 ENCOUNTER — Other Ambulatory Visit: Payer: Self-pay | Admitting: Family Medicine

## 2021-03-27 DIAGNOSIS — L720 Epidermal cyst: Secondary | ICD-10-CM | POA: Diagnosis not present

## 2021-04-30 DIAGNOSIS — Z9889 Other specified postprocedural states: Secondary | ICD-10-CM | POA: Diagnosis not present

## 2021-04-30 DIAGNOSIS — H25813 Combined forms of age-related cataract, bilateral: Secondary | ICD-10-CM | POA: Diagnosis not present

## 2021-04-30 DIAGNOSIS — H40043 Steroid responder, bilateral: Secondary | ICD-10-CM | POA: Diagnosis not present

## 2021-05-09 ENCOUNTER — Other Ambulatory Visit: Payer: Self-pay | Admitting: Family Medicine

## 2021-05-09 DIAGNOSIS — I10 Essential (primary) hypertension: Secondary | ICD-10-CM

## 2021-06-23 ENCOUNTER — Other Ambulatory Visit: Payer: Self-pay | Admitting: Family Medicine

## 2021-07-08 ENCOUNTER — Ambulatory Visit (INDEPENDENT_AMBULATORY_CARE_PROVIDER_SITE_OTHER): Payer: Medicare HMO | Admitting: Family Medicine

## 2021-07-08 ENCOUNTER — Encounter: Payer: Self-pay | Admitting: Family Medicine

## 2021-07-08 ENCOUNTER — Other Ambulatory Visit: Payer: Self-pay

## 2021-07-08 ENCOUNTER — Ambulatory Visit (INDEPENDENT_AMBULATORY_CARE_PROVIDER_SITE_OTHER): Payer: Medicare HMO

## 2021-07-08 VITALS — BP 107/61 | HR 54 | Temp 98.2°F | Ht 70.0 in | Wt 211.2 lb

## 2021-07-08 DIAGNOSIS — J449 Chronic obstructive pulmonary disease, unspecified: Secondary | ICD-10-CM | POA: Diagnosis not present

## 2021-07-08 DIAGNOSIS — I10 Essential (primary) hypertension: Secondary | ICD-10-CM

## 2021-07-08 DIAGNOSIS — E785 Hyperlipidemia, unspecified: Secondary | ICD-10-CM

## 2021-07-08 DIAGNOSIS — M4802 Spinal stenosis, cervical region: Secondary | ICD-10-CM | POA: Diagnosis not present

## 2021-07-08 DIAGNOSIS — N400 Enlarged prostate without lower urinary tract symptoms: Secondary | ICD-10-CM | POA: Diagnosis not present

## 2021-07-08 DIAGNOSIS — Z23 Encounter for immunization: Secondary | ICD-10-CM | POA: Diagnosis not present

## 2021-07-08 DIAGNOSIS — E559 Vitamin D deficiency, unspecified: Secondary | ICD-10-CM | POA: Diagnosis not present

## 2021-07-08 DIAGNOSIS — G2581 Restless legs syndrome: Secondary | ICD-10-CM | POA: Diagnosis not present

## 2021-07-08 DIAGNOSIS — Z0000 Encounter for general adult medical examination without abnormal findings: Secondary | ICD-10-CM | POA: Diagnosis not present

## 2021-07-08 DIAGNOSIS — R059 Cough, unspecified: Secondary | ICD-10-CM | POA: Diagnosis not present

## 2021-07-08 DIAGNOSIS — R0600 Dyspnea, unspecified: Secondary | ICD-10-CM | POA: Diagnosis not present

## 2021-07-08 LAB — URINALYSIS
Bilirubin, UA: NEGATIVE
Glucose, UA: NEGATIVE
Ketones, UA: NEGATIVE
Leukocytes,UA: NEGATIVE
Nitrite, UA: NEGATIVE
Protein,UA: NEGATIVE
RBC, UA: NEGATIVE
Specific Gravity, UA: 1.015 (ref 1.005–1.030)
Urobilinogen, Ur: 0.2 mg/dL (ref 0.2–1.0)
pH, UA: 5 (ref 5.0–7.5)

## 2021-07-08 MED ORDER — ROSUVASTATIN CALCIUM 40 MG PO TABS: 40.0000 mg | ORAL_TABLET | Freq: Every day | ORAL | 3 refills | Status: DC

## 2021-07-08 MED ORDER — BETAMETHASONE DIPROPIONATE AUG 0.05 % EX OINT: TOPICAL_OINTMENT | Freq: Two times a day (BID) | CUTANEOUS | 10 refills | Status: DC

## 2021-07-08 NOTE — Progress Notes (Addendum)
.  Subjective:  Patient ID: Daniel [name], male    DOB: 1956-02-04  Age: 65 y.o. MRN: [id number]  CC: Annual Exam   HPI [name] presents for CPE. Some difficulty sleeping. 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 Causes an allergy that interferes with sleep. Using flexeril prn for cramps everywhere. Legs, hands back, etc. Using baclofen TID as well as prn flexeril.   Depression screen [facility] 2/9 07/08/2021 12/31/2020 07/03/2020  Decreased Interest 0 0 0  Down, Depressed, Hopeless 0 0 0  PHQ - 2 Score 0 0 0    History [name] has a past medical history of Acute ischemic stroke ([place]) (03/12/2018), BPH (benign prostatic hyperplasia), COPD (chronic obstructive pulmonary disease) ([place]), Hyperlipidemia, Hypertension, Pulmonary nodule, renal stents, Stroke ([place]), and TIA (transient ischemic attack) (03/11/2018).   He has a past surgical history that includes No past surgeries; renal stents; and Cervical fusion.   His family history includes Cancer in his father; Emphysema in his father; Lung cancer in his mother.He reports that he has quit smoking. His smoking use included cigarettes. He has a 42.00 pack-year smoking history. He has never used smokeless tobacco. He reports that he does not drink alcohol and does not use drugs.    ROS Review of Systems  Constitutional:  Negative for activity change, fatigue and unexpected weight change.  HENT:  Negative for congestion, ear pain, hearing loss, postnasal drip and trouble swallowing.   Eyes:  Negative for pain and visual disturbance.  Respiratory:  Negative for cough, chest tightness and shortness of breath.   Cardiovascular:  Negative for chest pain, palpitations and leg swelling.  Gastrointestinal:  Negative for abdominal distention, abdominal pain, blood in stool, constipation, diarrhea, nausea and vomiting.  Endocrine: Negative for cold intolerance, heat intolerance and polydipsia.  Genitourinary:  Negative for difficulty urinating, dysuria,  flank pain, frequency and urgency.  Musculoskeletal:  Positive for arthralgias (shins, left ankle, index fingers - interfering with his work.). Negative for joint swelling.  Skin:  Positive for rash (t. crusris rash using a med.). Negative for color change and wound.  Neurological:  Negative for dizziness, syncope, speech difficulty, weakness, light-headedness, numbness and headaches.  Hematological:  Does not bruise/bleed easily.  Psychiatric/Behavioral:  Negative for confusion, decreased concentration, dysphoric mood and sleep disturbance. The patient is not nervous/anxious.    Objective:  BP 107/61   Pulse (!) 54   Temp 98.2 F (36.8 C)   Ht 5' 10"  (1.778 m)   Wt 211 lb 3.2 oz (95.8 kg)   SpO2 97%   BMI 30.30 kg/m   BP Readings from Last 3 Encounters:  07/08/21 107/61  12/31/20 119/75  10/03/20 110/69    Wt Readings from Last 3 Encounters:  07/08/21 211 lb 3.2 oz (95.8 kg)  12/31/20 202 lb 2 oz (91.7 kg)  10/03/20 207 lb (93.9 kg)     Physical Exam Constitutional:      Appearance: He is well-developed.  HENT:     Head: Normocephalic and atraumatic.  Eyes:     Pupils: Pupils are equal, round, and reactive to light.  Neck:     Thyroid: No thyromegaly.     Trachea: No tracheal deviation.  Cardiovascular:     Rate and Rhythm: Normal rate and regular rhythm.     Heart sounds: Normal heart sounds. No murmur heard.   No friction rub. No gallop.  Pulmonary:     Breath sounds: Normal breath sounds. No wheezing or rales.  Abdominal:  General: Bowel sounds are normal. There is no distension.     Palpations: Abdomen is soft. There is no mass.     Tenderness: There is no abdominal tenderness.     Hernia: There is no hernia in the left inguinal area.  Genitourinary:    Penis: Normal.      Testes: Normal.  Musculoskeletal:        General: Normal range of motion.     Cervical back: Normal range of motion.  Lymphadenopathy:     Cervical: No cervical adenopathy.   Skin:    General: Skin is warm and dry.  Neurological:     Mental Status: He is alert and oriented to person, place, and time.      Assessment & Plan:   Reegan was seen today for annual exam.  Diagnoses and all orders for this visit:  Chronic obstructive pulmonary disease, unspecified COPD type ([place]) -     CBC with Differential/Platelet -     CMP14+EGFR -     Urinalysis -     DG Chest 2 View; Future  Primary hypertension -     CMP14+EGFR -     Urinalysis  Hyperlipidemia, unspecified hyperlipidemia type -     CMP14+EGFR -     Lipid panel -     Urinalysis  Cervical stenosis of spine -     CBC with Differential/Platelet -     CMP14+EGFR -     Urinalysis  Benign prostatic hyperplasia, unspecified whether lower urinary tract symptoms present -     PSA, total and free -     Urinalysis  Restless leg syndrome -     CMP14+EGFR -     Urinalysis  Well adult exam -     CBC with Differential/Platelet -     CMP14+EGFR -     Lipid panel -     PSA, total and free -     TSH + free T4 -     VITAMIN D 25 Hydroxy (Vit-D Deficiency, Fractures) -     Urinalysis  Vitamin D deficiency -     VITAMIN D 25 Hydroxy (Vit-D Deficiency, Fractures)  Need for pneumococcal vaccination -     Pneumococcal polysaccharide vaccine 23-valent greater than or equal to 2yo subcutaneous/IM  Other orders -     augmented betamethasone dipropionate (DIPROLENE) 0.05 % ointment; Apply topically 2 (two) times daily. -     rosuvastatin (CRESTOR) 40 MG tablet; Take 1 tablet (40 mg total) by mouth daily. For cholesterol      I have discontinued Livingston Denner "[name]"'s brimonidine, ciprofloxacin, and atorvastatin. I am also having him start on rosuvastatin. Additionally, I am having him maintain his cyclobenzaprine, aspirin, halobetasol, baclofen, SUPER B COMPLEX/C PO, glucosamine-chondroitin, Vitamin D, Trelegy Ellipta, metoprolol succinate, clopidogrel, and augmented betamethasone  dipropionate.  Allergies as of 07/08/2021   No Known Allergies      Medication List        Accurate as of July 08, 2021 11:59 PM. If you have any questions, ask your nurse or doctor.          STOP taking these medications    atorvastatin 80 MG tablet Commonly known as: LIPITOR Stopped by: [name], MD   brimonidine 0.2 % ophthalmic solution Commonly known as: ALPHAGAN Stopped by: [name], MD   ciprofloxacin 500 MG tablet Commonly known as: Cipro Stopped by: [name], MD       TAKE these medications    aspirin 81  MG EC tablet Take by mouth.   augmented betamethasone dipropionate 0.05 % ointment Commonly known as: Diprolene Apply topically 2 (two) times daily. Started by: [name], MD   baclofen 10 MG tablet Commonly known as: LIORESAL Take 1 tablet (10 mg total) by mouth 3 (three) times daily.   clopidogrel 75 MG tablet Commonly known as: PLAVIX TAKE 1 TABLET(75 MG) BY MOUTH DAILY   cyclobenzaprine 5 MG tablet Commonly known as: FLEXERIL Take 5 mg by mouth at bedtime as needed for muscle spasms.   glucosamine-chondroitin 500-400 MG tablet Take 1 tablet by mouth 3 (three) times daily.   halobetasol 0.05 % cream Commonly known as: Ultravate Apply topically 2 (two) times daily.   metoprolol succinate 50 MG 24 hr tablet Commonly known as: TOPROL-XL TAKE 1 TABLET(50 MG) BY MOUTH DAILY WITH OR IMMEDIATELY FOLLOWING A MEAL   rosuvastatin 40 MG tablet Commonly known as: Crestor Take 1 tablet (40 mg total) by mouth daily. For cholesterol Started by: [name], MD   SUPER B COMPLEX/C PO Take by mouth.   Trelegy Ellipta 100-62.5-25 MCG/INH Aepb Generic drug: Fluticasone-Umeclidin-Vilant Inhale 1 puff into the lungs daily.   Vitamin D 50 MCG (2000 UT) Caps Take by mouth.         Follow-up: Return in about 3 months (around 10/07/2021) for COPD, hypertension.  [name], M.D.

## 2021-07-08 NOTE — Addendum Note (Signed)
Addended by: Baldomero Lamy B on: 07/08/2021 12:51 PM   Modules accepted: Orders

## 2021-07-08 NOTE — Progress Notes (Signed)
Your chest x-ray looked normal. Thanks, WS.

## 2021-07-09 LAB — CBC WITH DIFFERENTIAL/PLATELET
Basophils Absolute: 0 10*3/uL (ref 0.0–0.2)
Basos: 1 %
EOS (ABSOLUTE): 0.1 10*3/uL (ref 0.0–0.4)
Eos: 2 %
Hematocrit: 45 % (ref 37.5–51.0)
Hemoglobin: 15.3 g/dL (ref 13.0–17.7)
Immature Grans (Abs): 0 10*3/uL (ref 0.0–0.1)
Immature Granulocytes: 0 %
Lymphocytes Absolute: 1.4 10*3/uL (ref 0.7–3.1)
Lymphs: 21 %
MCH: 33.6 pg — ABNORMAL HIGH (ref 26.6–33.0)
MCHC: 34 g/dL (ref 31.5–35.7)
MCV: 99 fL — ABNORMAL HIGH (ref 79–97)
Monocytes Absolute: 0.6 10*3/uL (ref 0.1–0.9)
Monocytes: 9 %
Neutrophils Absolute: 4.6 10*3/uL (ref 1.4–7.0)
Neutrophils: 67 %
Platelets: 169 10*3/uL (ref 150–450)
RBC: 4.55 x10E6/uL (ref 4.14–5.80)
RDW: 13.4 % (ref 11.6–15.4)
WBC: 6.7 10*3/uL (ref 3.4–10.8)

## 2021-07-09 LAB — TSH+FREE T4
Free T4: 1.15 ng/dL (ref 0.82–1.77)
TSH: 4.19 u[IU]/mL (ref 0.450–4.500)

## 2021-07-09 LAB — CMP14+EGFR
ALT: 23 IU/L (ref 0–44)
AST: 22 IU/L (ref 0–40)
Albumin/Globulin Ratio: 2.3 — ABNORMAL HIGH (ref 1.2–2.2)
Albumin: 4.6 g/dL (ref 3.8–4.8)
Alkaline Phosphatase: 81 IU/L (ref 44–121)
BUN/Creatinine Ratio: 9 — ABNORMAL LOW (ref 10–24)
BUN: 10 mg/dL (ref 8–27)
Bilirubin Total: 1 mg/dL (ref 0.0–1.2)
CO2: 20 mmol/L (ref 20–29)
Calcium: 9.4 mg/dL (ref 8.6–10.2)
Chloride: 104 mmol/L (ref 96–106)
Creatinine, Ser: 1.11 mg/dL (ref 0.76–1.27)
Globulin, Total: 2 g/dL (ref 1.5–4.5)
Glucose: 106 mg/dL — ABNORMAL HIGH (ref 65–99)
Potassium: 4.4 mmol/L (ref 3.5–5.2)
Sodium: 139 mmol/L (ref 134–144)
Total Protein: 6.6 g/dL (ref 6.0–8.5)
eGFR: 74 mL/min/{1.73_m2} (ref >59)

## 2021-07-09 LAB — LIPID PANEL
Chol/HDL Ratio: 2.7 ratio (ref 0.0–5.0)
Cholesterol, Total: 155 mg/dL (ref 100–199)
HDL: 57 mg/dL (ref >39)
LDL Chol Calc (NIH): 79 mg/dL (ref 0–99)
Triglycerides: 102 mg/dL (ref 0–149)
VLDL Cholesterol Cal: 19 mg/dL (ref 5–40)

## 2021-07-09 LAB — PSA, TOTAL AND FREE
PSA, Free Pct: 30 %
PSA, Free: 0.09 ng/mL
Prostate Specific Ag, Serum: 0.3 ng/mL (ref 0.0–4.0)

## 2021-07-09 LAB — VITAMIN D 25 HYDROXY (VIT D DEFICIENCY, FRACTURES): Vit D, 25-Hydroxy: 44.3 ng/mL (ref 30.0–100.0)

## 2021-07-09 NOTE — Progress Notes (Signed)
Patient aware, viewed on MyChart

## 2021-07-10 NOTE — Progress Notes (Signed)
Hello Daniel [name],  Your lab result is normal and/or stable.Some minor variations that are not significant are commonly marked abnormal, but do not represent any medical problem for you.  Best regards, Koden Hunzeker, M.D.

## 2021-08-10 ENCOUNTER — Other Ambulatory Visit: Payer: Self-pay | Admitting: Family Medicine

## 2021-08-10 DIAGNOSIS — I10 Essential (primary) hypertension: Secondary | ICD-10-CM

## 2021-09-21 ENCOUNTER — Other Ambulatory Visit: Payer: Self-pay | Admitting: Family Medicine

## 2021-10-01 DIAGNOSIS — Z23 Encounter for immunization: Secondary | ICD-10-CM | POA: Diagnosis not present

## 2021-10-01 DIAGNOSIS — S0101XA Laceration without foreign body of scalp, initial encounter: Secondary | ICD-10-CM | POA: Diagnosis not present

## 2021-10-06 ENCOUNTER — Ambulatory Visit: Payer: Medicare HMO | Admitting: Adult Health

## 2021-10-06 ENCOUNTER — Encounter: Payer: Self-pay | Admitting: Adult Health

## 2021-10-06 VITALS — BP 106/74 | HR 58 | Ht 70.5 in | Wt 215.2 lb

## 2021-10-06 DIAGNOSIS — R252 Cramp and spasm: Secondary | ICD-10-CM

## 2021-10-06 DIAGNOSIS — Z8673 Personal history of transient ischemic attack (TIA), and cerebral infarction without residual deficits: Secondary | ICD-10-CM

## 2021-10-06 MED ORDER — BACLOFEN 10 MG PO TABS: 10.0000 mg | ORAL_TABLET | Freq: Three times a day (TID) | ORAL | 3 refills | Status: DC
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
## 2021-10-06 NOTE — Patient Instructions (Addendum)
Continue clopidogrel 75 mg daily  and Crestor 40mg  daily  for secondary stroke prevention  Please stop aspirin as ongoing use not needed in addition to plavix (clopidogrel)   Continue to follow up with PCP regarding cholesterol and blood pressure management  Maintain strict control of hypertension with blood pressure goal below 130/90 and cholesterol with LDL cholesterol (bad cholesterol) goal below 70 mg/dL.   Continue baclofen 10mg  three times daily for cramping sensation      Followup in the future with me in 1 year or call earlier if needed       Thank you for coming to see [place] at [facility]. I hope we have been able to provide you high quality care today.  You may receive a patient satisfaction survey over the next few weeks. We would appreciate your feedback and comments so that we may continue to improve ourselves and the health of our patients.

## 2021-10-07 ENCOUNTER — Ambulatory Visit: Payer: Medicare HMO | Admitting: Family Medicine

## 2021-10-09 ENCOUNTER — Encounter: Payer: Self-pay | Admitting: Family Medicine

## 2021-10-09 ENCOUNTER — Other Ambulatory Visit: Payer: Self-pay

## 2021-10-09 ENCOUNTER — Ambulatory Visit (INDEPENDENT_AMBULATORY_CARE_PROVIDER_SITE_OTHER): Payer: Medicare HMO | Admitting: Family Medicine

## 2021-10-09 VITALS — BP 110/65 | HR 53 | Temp 98.0°F | Ht 70.5 in | Wt 213.4 lb

## 2021-10-09 DIAGNOSIS — M25512 Pain in left shoulder: Secondary | ICD-10-CM | POA: Diagnosis not present

## 2021-10-09 DIAGNOSIS — L409 Psoriasis, unspecified: Secondary | ICD-10-CM | POA: Diagnosis not present

## 2021-10-09 DIAGNOSIS — L570 Actinic keratosis: Secondary | ICD-10-CM | POA: Diagnosis not present

## 2021-10-09 DIAGNOSIS — B372 Candidiasis of skin and nail: Secondary | ICD-10-CM

## 2021-10-09 DIAGNOSIS — K2100 Gastro-esophageal reflux disease with esophagitis, without bleeding: Secondary | ICD-10-CM | POA: Diagnosis not present

## 2021-10-09 DIAGNOSIS — G8929 Other chronic pain: Secondary | ICD-10-CM

## 2021-10-09 DIAGNOSIS — I10 Essential (primary) hypertension: Secondary | ICD-10-CM | POA: Diagnosis not present

## 2021-10-09 MED ORDER — PANTOPRAZOLE SODIUM 40 MG PO TBEC: 40.0000 mg | DELAYED_RELEASE_TABLET | Freq: Every day | ORAL | 11 refills | Status: DC

## 2021-10-09 MED ORDER — METOPROLOL SUCCINATE ER 50 MG PO TB24: ORAL_TABLET | ORAL | 3 refills | Status: DC

## 2021-10-09 NOTE — Progress Notes (Signed)
Subjective:  Patient ID: Daniel [name], male    DOB: 01/29/1956  Age: 65 y.o. MRN: [id number]  CC: Medical Management of Chronic Issues   HPI Daniel [name] presents for heartburn. Used PPI in past.  Was told not to use it and was switched over to pantoprazole.  He eventually discontinued that but now his heartburn has returned.  He is having it radiate up substernal area to the base of his neck in the form of reflux.  He denies any vomiting hematemesis or hematochezia.  Patient is having left shoulder pain that is reducing his range of motion.  He has a history of fusion in the cervical spine.  When that was done by Dr. Owens Shark in [place] he was having radicular pain to both sides.  This 1 seems to be more in the left shoulder area but it does radiate down his arm into the fingers which are also painful.  Patient is having multiple skin eruptions that he needs checked.  He is groin rash did not respond to the Lamisil.  He went to a dermatologist who told him to use it everywhere and it has done nothing.  He wants a second opinion from a different dermatologist.  This 1 was at [facility] at the [place] area in [place].  He does not want to go back there.  He has a history of psoriasis for which she has been using the Ultravate.  That has not cleared his current lesions on the forearms and elbows groin and hands.  Depression screen [facility] 2/9 10/09/2021 07/08/2021 12/31/2020  Decreased Interest 0 0 0  Down, Depressed, Hopeless 0 0 0  PHQ - 2 Score 0 0 0    History Daniel [name] has a past medical history of Acute ischemic stroke ([place]) (03/12/2018), BPH (benign prostatic hyperplasia), COPD (chronic obstructive pulmonary disease) ([place]), Hyperlipidemia, Hypertension, Pulmonary nodule, renal stents, Stroke ([place]), and TIA (transient ischemic attack) (03/11/2018).   He has a past surgical history that includes No past surgeries; renal stents; and Cervical fusion.    His family history includes Cancer in his father; Emphysema in his father; Lung cancer in his mother.He reports that he has quit smoking. His smoking use included cigarettes. He has a 42.00 pack-year smoking history. He has never used smokeless tobacco. He reports that he does not drink alcohol and does not use drugs.    ROS Review of Systems  Constitutional:  Negative for fever.  Respiratory:  Negative for shortness of breath.   Cardiovascular:  Negative for chest pain.  Musculoskeletal:  Negative for arthralgias.  Skin:  Positive for rash.   Objective:  BP 110/65   Pulse (!) 53   Temp 98 F (36.7 C)   Ht 5' 10.5" (1.791 m)   Wt 213 lb 6.4 oz (96.8 kg)   SpO2 97%   BMI 30.19 kg/m   BP Readings from Last 3 Encounters:  10/09/21 110/65  10/06/21 106/74  07/08/21 107/61    Wt Readings from Last 3 Encounters:  10/09/21 213 lb 6.4 oz (96.8 kg)  10/06/21 215 lb 4 oz (97.6 kg)  07/08/21 211 lb 3.2 oz (95.8 kg)     Physical Exam Vitals reviewed.  Constitutional:      Appearance: He is well-developed.  HENT:     Head: Normocephalic and atraumatic.     Right Ear: External ear normal.     Left Ear: External ear normal.     Mouth/Throat:  Pharynx: No oropharyngeal exudate or posterior oropharyngeal erythema.  Eyes:     Pupils: Pupils are equal, round, and reactive to light.  Cardiovascular:     Rate and Rhythm: Normal rate and regular rhythm.     Heart sounds: No murmur heard. Pulmonary:     Effort: No respiratory distress.     Breath sounds: Normal breath sounds.  Musculoskeletal:        General: Tenderness (Left acromioclavicular and glenohumeral joint area.  Decreased abduction to about 70 degrees) present.     Cervical back: Normal range of motion and neck supple.  Skin:    General: Skin is warm and dry.     Findings: Lesion (Dry peeling skin on the forearms appearing to be actinic in nature.  There is a wart with a psoriasis plaque intermingled at the left  olecranon.  There is mild peeling of the palms.) present.  Neurological:     Mental Status: He is alert and oriented to person, place, and time.      Assessment & Plan:   Daniel [name] was seen today for medical management of chronic issues.  Diagnoses and all orders for this visit:  Gastroesophageal reflux disease with esophagitis without hemorrhage  Essential hypertension -     metoprolol succinate (TOPROL-XL) 50 MG 24 hr tablet; TAKE 1 TABLET(50 MG) BY MOUTH DAILY WITH OR IMMEDIATELY FOLLOWING A MEAL  Chronic left shoulder pain -     Ambulatory referral to Orthopedics  Psoriasis -     Ambulatory referral to Dermatology  Actinic keratoses -     Ambulatory referral to Dermatology  Yeast dermatitis -     Ambulatory referral to Dermatology  Other orders -     pantoprazole (PROTONIX) 40 MG tablet; Take 1 tablet (40 mg total) by mouth daily. For stomach      I am having Daniel [name] "Daniel [name]" start on pantoprazole. I am also having him maintain his cyclobenzaprine, halobetasol, SUPER B COMPLEX/C PO, glucosamine-chondroitin, Vitamin D, Trelegy Ellipta, augmented betamethasone dipropionate, rosuvastatin, clopidogrel, baclofen, and metoprolol succinate.  Allergies as of 10/09/2021   No Known Allergies      Medication List        Accurate as of October 09, 2021  3:08 PM. If you have any questions, ask your nurse or doctor.          augmented betamethasone dipropionate 0.05 % ointment Commonly known as: Diprolene Apply topically 2 (two) times daily.   baclofen 10 MG tablet Commonly known as: LIORESAL Take 1 tablet (10 mg total) by mouth 3 (three) times daily.   clopidogrel 75 MG tablet Commonly known as: PLAVIX TAKE 1 TABLET(75 MG) BY MOUTH DAILY   cyclobenzaprine 5 MG tablet Commonly known as: FLEXERIL Take 5 mg by mouth at bedtime as needed for muscle spasms.   glucosamine-chondroitin 500-400 MG tablet Take 1 tablet by mouth 3 (three) times daily.    halobetasol 0.05 % cream Commonly known as: Ultravate Apply topically 2 (two) times daily.   metoprolol succinate 50 MG 24 hr tablet Commonly known as: TOPROL-XL TAKE 1 TABLET(50 MG) BY MOUTH DAILY WITH OR IMMEDIATELY FOLLOWING A MEAL   pantoprazole 40 MG tablet Commonly known as: PROTONIX Take 1 tablet (40 mg total) by mouth daily. For stomach Started by: Daniel Fraise, MD   rosuvastatin 40 MG tablet Commonly known as: Crestor Take 1 tablet (40 mg total) by mouth daily. For cholesterol   SUPER B COMPLEX/C PO Take by mouth.   Trelegy Ellipta 100-62.5-25  MCG/ACT Aepb Generic drug: Fluticasone-Umeclidin-Vilant Inhale 1 puff into the lungs daily.   Vitamin D 50 MCG (2000 UT) Caps Take by mouth.         Follow-up: No follow-ups on file.  Daniel [name], M.D.

## 2021-10-15 ENCOUNTER — Ambulatory Visit (INDEPENDENT_AMBULATORY_CARE_PROVIDER_SITE_OTHER): Payer: Medicare HMO

## 2021-10-15 ENCOUNTER — Other Ambulatory Visit: Payer: Self-pay

## 2021-10-15 ENCOUNTER — Ambulatory Visit: Payer: Medicare HMO | Admitting: Orthopaedic Surgery

## 2021-10-15 DIAGNOSIS — G8929 Other chronic pain: Secondary | ICD-10-CM | POA: Diagnosis not present

## 2021-10-15 DIAGNOSIS — M25512 Pain in left shoulder: Secondary | ICD-10-CM

## 2021-10-15 NOTE — Progress Notes (Signed)
Office Visit Note   Patient: Daniel [name]           Date of Birth: 05-Dec-1955           MRN: [id number] Visit Date: 10/15/2021              Requested by: [name], MD [place],  [place] [id number] PCP: [name], MD   Assessment & Plan: Visit Diagnoses:  1. Chronic left shoulder pain     Plan: Impression is chronic left shoulder pain with associated weakness.  Based on the patient's symptoms and clinical exam, I am worried for rotator cuff tear.  I would like to get an MRI to assess this.  He will follow-up with [place] once completed.  Call with concerns or questions in the meantime.  Follow-Up Instructions: Return for f/u after mri.   Orders:  Orders Placed This Encounter  Procedures   XR Shoulder Left   No orders of the defined types were placed in this encounter.     Procedures: No procedures performed   Clinical Data: No additional findings.   Subjective: Chief Complaint  Patient presents with   Left Shoulder - Pain    HPI patient is a pleasant 65 year old gentleman who comes in today with left shoulder pain for 3+ months.  He denies any injury or change in activity.  The pain is to the deltoid.  He has pain with forward flexion, internal rotation, abduction as well as when he is sleeping on the left side.  He is also noticed significant weakness.  He has not been taking any pain medication for this.  He denies any numbness, tingling or burning.  He does note history of cervical spine fusion back in 2015.  He also had a stroke in 2018 without any motor deficits.  Review of Systems as detailed in HPI.  All others reviewed and are negative.   Objective: Vital Signs: There were no vitals taken for this visit.  Physical Exam well-developed well-nourished gentleman in no acute distress.  Alert and oriented x3.  Ortho Exam left shoulder exam reveals active forward flexion to approximately 75 degrees.  I can passively get him to about 160  degrees.  He does have a positive drop arm.  Pain with empty can test.  Negative belly press.  Near full strength with resisted internal and external rotation.  He is neurovascular intact distally.  Specialty Comments:  No specialty comments available.  Imaging: No results found.   PMFS History: Patient Active Problem List   Diagnosis Date Noted   Restless leg syndrome 09/09/2018   Right sided weakness 03/11/2018   Alcohol abuse 03/11/2018   Hypertension 02/07/2018   Cervical stenosis of spine 01/17/2016   BPH (benign prostatic hyperplasia) 07/20/2013   Abnormal chest CT 07/20/2013   Hyperlipidemia 07/20/2013   COPD (chronic obstructive pulmonary disease) ([place]) 12/08/2011   Smoker 12/08/2011   Multiple pulmonary nodules 12/08/2011   Past Medical History:  Diagnosis Date   Acute ischemic stroke ([place]) 03/12/2018   BPH (benign prostatic hyperplasia)    COPD (chronic obstructive pulmonary disease) ([facility])    Hyperlipidemia    Hypertension    Pulmonary nodule    renal stents    Stroke [facility])    TIA (transient ischemic attack) 03/11/2018    Family History  Problem Relation Age of Onset   Emphysema Father    Cancer Father    Lung cancer Mother     Past Surgical History:  Procedure Laterality Date   CERVICAL FUSION     NO PAST SURGERIES     renal stents     Social History   Occupational History   Occupation: Museum/gallery curator  Tobacco Use   Smoking status: Former    Packs/day: 1.00    Years: 42.00    Pack years: 42.00    Types: Cigarettes   Smokeless tobacco: Never  Substance and Sexual Activity   Alcohol use: Never    Comment: not dirnking now   Drug use: Never   Sexual activity: Not on file

## 2021-10-20 ENCOUNTER — Encounter: Payer: Self-pay | Admitting: Orthopaedic Surgery

## 2021-11-08 ENCOUNTER — Other Ambulatory Visit: Payer: Self-pay

## 2021-11-08 ENCOUNTER — Ambulatory Visit
Admission: RE | Admit: 2021-11-08 | Discharge: 2021-11-08 | Disposition: A | Discharge status: Home / Self Care | Payer: Medicare HMO | Source: Ambulatory Visit | Attending: Orthopaedic Surgery | Admitting: Orthopaedic Surgery

## 2021-11-08 DIAGNOSIS — S46012A Strain of muscle(s) and tendon(s) of the rotator cuff of left shoulder, initial encounter: Secondary | ICD-10-CM | POA: Diagnosis not present

## 2021-11-08 DIAGNOSIS — G8929 Other chronic pain: Secondary | ICD-10-CM

## 2021-11-08 DIAGNOSIS — M25512 Pain in left shoulder: Secondary | ICD-10-CM

## 2021-11-09 ENCOUNTER — Other Ambulatory Visit: Payer: Medicare HMO

## 2021-11-12 ENCOUNTER — Ambulatory Visit: Payer: Medicare HMO | Admitting: Orthopaedic Surgery

## 2021-11-12 ENCOUNTER — Encounter: Payer: Self-pay | Admitting: Orthopaedic Surgery

## 2021-11-12 ENCOUNTER — Other Ambulatory Visit: Payer: Self-pay

## 2021-11-12 DIAGNOSIS — M75102 Unspecified rotator cuff tear or rupture of left shoulder, not specified as traumatic: Secondary | ICD-10-CM | POA: Diagnosis not present

## 2021-11-12 DIAGNOSIS — M67922 Unspecified disorder of synovium and tendon, left upper arm: Secondary | ICD-10-CM | POA: Diagnosis not present

## 2021-11-12 NOTE — Progress Notes (Signed)
Office Visit Note   Patient: Daniel [name]           Date of Birth: 01/14/1956           MRN: [id number] Visit Date: 11/12/2021              Requested by: [name], MD [place],  [place] [id number] PCP: [name], MD   Assessment & Plan: Visit Diagnoses:  1. Nontraumatic tear of supraspinatus tendon, left   2. Tendinopathy of left biceps     Plan: Mr. Want is here to discuss left shoulder MRI findings.  He states that the pain is worse.  Left shoulder exam demonstrates pain to the lateral deltoid with active abduction.  Positive Speed test.  Biceps tendon is tender to palpation.  Range of motion preserved.  Negative cross body adduction.  MRI of the left shoulder shows full-thickness 11 mm tear of the anterior supraspinatus.  Moderate tendinosis of the subscapularis.  Moderate tendinopathy of the biceps tendon.  These findings were reviewed with the patient detail.  Given lack of improvement from conservative management patient has elected to move forward with rotator cuff repair and biceps tenodesis.  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  We will need to get clearance from neurology to stop the Plavix and to have the surgery.  Risk benefits rehab recovery reviewed with the patient in detail.  Follow-Up Instructions: No follow-ups on file.   Orders:  No orders of the defined types were placed in this encounter.  No orders of the defined types were placed in this encounter.     Procedures: No procedures performed   Clinical Data: No additional findings.   Subjective: Chief Complaint  Patient presents with   Left Shoulder - Pain, Follow-up    HPI  Review of Systems  Constitutional: Negative.   All other systems reviewed and are negative.   Objective: Vital Signs: There were no vitals taken for this visit.  Physical Exam Vitals and nursing note reviewed.  Constitutional:       Appearance: He is well-developed.  Pulmonary:     Effort: Pulmonary effort is normal.  Abdominal:     Palpations: Abdomen is soft.  Skin:    General: Skin is warm.  Neurological:     Mental Status: He is alert and oriented to person, place, and time.  Psychiatric:        Behavior: Behavior normal.        Thought Content: Thought content normal.        Judgment: Judgment normal.    Ortho Exam  Specialty Comments:  No specialty comments available.  Imaging: No results found.   PMFS History: Patient Active Problem List   Diagnosis Date Noted   Nontraumatic tear of supraspinatus tendon, left 11/12/2021   Tendinopathy of left biceps 11/12/2021   Restless leg syndrome 09/09/2018   Right sided weakness 03/11/2018   Alcohol abuse 03/11/2018   Hypertension 02/07/2018   Cervical stenosis of spine 01/17/2016   BPH (benign prostatic hyperplasia) 07/20/2013   Abnormal chest CT 07/20/2013   Hyperlipidemia 07/20/2013   COPD (chronic obstructive pulmonary disease) ([place]) 12/08/2011   Smoker 12/08/2011   Multiple pulmonary nodules 12/08/2011   Past Medical History:  Diagnosis Date   Acute ischemic stroke ([place]) 03/12/2018   BPH (benign prostatic hyperplasia)    COPD (chronic obstructive pulmonary disease) (HCC)    Hyperlipidemia    Hypertension  Pulmonary nodule    renal stents    Stroke [facility])    TIA (transient ischemic attack) 03/11/2018    Family History  Problem Relation Age of Onset   Emphysema Father    Cancer Father    Lung cancer Mother     Past Surgical History:  Procedure Laterality Date   CERVICAL FUSION     NO PAST SURGERIES     renal stents     Social History   Occupational History   Occupation: Museum/gallery curator  Tobacco Use   Smoking status: Former    Packs/day: 1.00    Years: 42.00    Pack years: 42.00    Types: Cigarettes   Smokeless tobacco: Never  Substance and Sexual Activity   Alcohol use: Never    Comment: not dirnking now   Drug use: Never    Sexual activity: Not on file

## 2021-11-19 ENCOUNTER — Other Ambulatory Visit: Payer: Self-pay | Admitting: Family Medicine

## 2021-11-22 ENCOUNTER — Encounter: Payer: Self-pay | Admitting: Orthopaedic Surgery

## 2021-11-24 ENCOUNTER — Other Ambulatory Visit: Payer: Self-pay | Admitting: Family Medicine

## 2021-11-24 ENCOUNTER — Telehealth: Payer: Self-pay | Admitting: *Deleted

## 2021-11-24 MED ORDER — CLOBETASOL PROPIONATE 0.05 % EX CREA: 1.0000 "application " | TOPICAL_CREAM | Freq: Two times a day (BID) | CUTANEOUS | 0 refills | Status: DC

## 2021-11-24 NOTE — Telephone Encounter (Signed)
Fax from [place]: Halobetasol 0.05% cream 50 gm Plan does not cover, Per Rx benefit plan alternatives include Mometasone furoate Please Rx appropriate change along w/ strength, directions qty & refills

## 2021-11-24 NOTE — Telephone Encounter (Signed)
done

## 2021-11-24 NOTE — Telephone Encounter (Signed)
Please let the patient know that I sent their prescription to their pharmacy. Thanks, WS 

## 2021-11-25 ENCOUNTER — Encounter: Payer: Self-pay | Admitting: Orthopaedic Surgery

## 2021-11-27 ENCOUNTER — Telehealth: Payer: Self-pay

## 2021-11-27 NOTE — Telephone Encounter (Signed)
Completed form and sent back to [facility]- fax # [phone]. Confirmation received.

## 2021-11-27 NOTE — Telephone Encounter (Signed)
Signed and placed in outbox.  Thank you. ?

## 2021-11-27 NOTE — Telephone Encounter (Signed)
Received medical clearance paper work from [employer] for left shoulder rotator cuff repair.   Placed paperwork on MD's desk for review/signature.

## 2021-12-03 HISTORY — PX: ROTATOR CUFF REPAIR: SHX139

## 2021-12-11 DIAGNOSIS — Z95828 Presence of other vascular implants and grafts: Secondary | ICD-10-CM | POA: Diagnosis not present

## 2021-12-11 DIAGNOSIS — Z9889 Other specified postprocedural states: Secondary | ICD-10-CM | POA: Diagnosis not present

## 2021-12-11 DIAGNOSIS — I150 Renovascular hypertension: Secondary | ICD-10-CM | POA: Diagnosis not present

## 2021-12-11 DIAGNOSIS — Z79899 Other long term (current) drug therapy: Secondary | ICD-10-CM | POA: Diagnosis not present

## 2021-12-11 DIAGNOSIS — M79605 Pain in left leg: Secondary | ICD-10-CM | POA: Diagnosis not present

## 2021-12-11 DIAGNOSIS — I701 Atherosclerosis of renal artery: Secondary | ICD-10-CM | POA: Diagnosis not present

## 2021-12-11 DIAGNOSIS — M79604 Pain in right leg: Secondary | ICD-10-CM | POA: Diagnosis not present

## 2021-12-18 ENCOUNTER — Telehealth: Payer: Self-pay | Admitting: Orthopaedic Surgery

## 2021-12-18 NOTE — Telephone Encounter (Signed)
Patient's wife left a message requesting post op prescriptions to be sent into patient's pharmacy before surgery, if possible. Please call to advise.

## 2021-12-19 ENCOUNTER — Other Ambulatory Visit: Payer: Self-pay | Admitting: Physician Assistant

## 2021-12-19 MED ORDER — METHOCARBAMOL 500 MG PO TABS: 500.0000 mg | ORAL_TABLET | Freq: Two times a day (BID) | ORAL | 0 refills | Status: DC | PRN

## 2021-12-19 MED ORDER — OXYCODONE-ACETAMINOPHEN 5-325 MG PO TABS: 1.0000 | ORAL_TABLET | Freq: Four times a day (QID) | ORAL | 0 refills | Status: DC | PRN

## 2021-12-19 MED ORDER — ONDANSETRON HCL 4 MG PO TABS: 4.0000 mg | ORAL_TABLET | Freq: Three times a day (TID) | ORAL | 0 refills | Status: DC | PRN

## 2021-12-19 NOTE — Telephone Encounter (Signed)
Patient aware this was sent in for him  

## 2021-12-19 NOTE — Telephone Encounter (Signed)
When is his surgery?

## 2021-12-19 NOTE — Telephone Encounter (Signed)
Sent in to [facility]

## 2021-12-20 ENCOUNTER — Other Ambulatory Visit: Payer: Self-pay | Admitting: Family Medicine

## 2021-12-22 ENCOUNTER — Other Ambulatory Visit: Payer: Self-pay | Admitting: Physician Assistant

## 2021-12-22 MED ORDER — ONDANSETRON HCL 4 MG PO TABS: 4.0000 mg | ORAL_TABLET | Freq: Three times a day (TID) | ORAL | 0 refills | Status: DC | PRN

## 2021-12-22 MED ORDER — OXYCODONE-ACETAMINOPHEN 5-325 MG PO TABS: 1.0000 | ORAL_TABLET | Freq: Four times a day (QID) | ORAL | 0 refills | Status: DC | PRN

## 2021-12-25 ENCOUNTER — Encounter: Payer: Self-pay | Admitting: Orthopaedic Surgery

## 2021-12-25 DIAGNOSIS — M75102 Unspecified rotator cuff tear or rupture of left shoulder, not specified as traumatic: Secondary | ICD-10-CM | POA: Diagnosis not present

## 2021-12-25 DIAGNOSIS — G8918 Other acute postprocedural pain: Secondary | ICD-10-CM | POA: Diagnosis not present

## 2021-12-25 DIAGNOSIS — M67814 Other specified disorders of tendon, left shoulder: Secondary | ICD-10-CM | POA: Diagnosis not present

## 2021-12-25 DIAGNOSIS — S46012A Strain of muscle(s) and tendon(s) of the rotator cuff of left shoulder, initial encounter: Secondary | ICD-10-CM | POA: Diagnosis not present

## 2021-12-25 DIAGNOSIS — M7502 Adhesive capsulitis of left shoulder: Secondary | ICD-10-CM | POA: Diagnosis not present

## 2021-12-25 DIAGNOSIS — M67922 Unspecified disorder of synovium and tendon, left upper arm: Secondary | ICD-10-CM | POA: Diagnosis not present

## 2021-12-26 ENCOUNTER — Telehealth: Payer: Self-pay | Admitting: Physician Assistant

## 2021-12-26 NOTE — Telephone Encounter (Signed)
Patient aware.

## 2021-12-26 NOTE — Telephone Encounter (Signed)
Patient's wife Suanne Marker called asked when can patient start taking Clopidogrel 75 mg? The number to contact Suanne Marker is [phone]

## 2021-12-26 NOTE — Telephone Encounter (Signed)
now

## 2021-12-27 ENCOUNTER — Other Ambulatory Visit: Payer: Self-pay

## 2021-12-27 ENCOUNTER — Emergency Department (HOSPITAL_COMMUNITY): Payer: Medicare HMO

## 2021-12-27 ENCOUNTER — Emergency Department (HOSPITAL_COMMUNITY)
Admission: EM | Admit: 2021-12-27 | Discharge: 2021-12-27 | Disposition: A | Discharge status: Home / Self Care | Payer: Medicare HMO | Attending: Emergency Medicine | Admitting: Emergency Medicine

## 2021-12-27 DIAGNOSIS — R0600 Dyspnea, unspecified: Secondary | ICD-10-CM

## 2021-12-27 DIAGNOSIS — J9811 Atelectasis: Secondary | ICD-10-CM | POA: Diagnosis not present

## 2021-12-27 DIAGNOSIS — I10 Essential (primary) hypertension: Secondary | ICD-10-CM | POA: Insufficient documentation

## 2021-12-27 DIAGNOSIS — Z79899 Other long term (current) drug therapy: Secondary | ICD-10-CM | POA: Diagnosis not present

## 2021-12-27 DIAGNOSIS — Z7951 Long term (current) use of inhaled steroids: Secondary | ICD-10-CM | POA: Diagnosis not present

## 2021-12-27 DIAGNOSIS — Z981 Arthrodesis status: Secondary | ICD-10-CM | POA: Diagnosis not present

## 2021-12-27 DIAGNOSIS — J449 Chronic obstructive pulmonary disease, unspecified: Secondary | ICD-10-CM

## 2021-12-27 DIAGNOSIS — G5680 Other specified mononeuropathies of unspecified upper limb: Secondary | ICD-10-CM

## 2021-12-27 DIAGNOSIS — G588 Other specified mononeuropathies: Secondary | ICD-10-CM | POA: Insufficient documentation

## 2021-12-27 DIAGNOSIS — Z7902 Long term (current) use of antithrombotics/antiplatelets: Secondary | ICD-10-CM | POA: Diagnosis not present

## 2021-12-27 DIAGNOSIS — R0602 Shortness of breath: Secondary | ICD-10-CM | POA: Diagnosis not present

## 2021-12-27 LAB — TROPONIN I (HIGH SENSITIVITY)
Troponin I (High Sensitivity): 5 ng/L (ref <18)
Troponin I (High Sensitivity): 4 ng/L (ref <18)

## 2021-12-27 LAB — CBC WITH DIFFERENTIAL/PLATELET
Abs Immature Granulocytes: 0.02 10*3/uL (ref 0.00–0.07)
Basophils Absolute: 0 10*3/uL (ref 0.0–0.1)
Basophils Relative: 0 %
Eosinophils Absolute: 0.1 10*3/uL (ref 0.0–0.5)
Eosinophils Relative: 2 %
HCT: 36.8 % — ABNORMAL LOW (ref 39.0–52.0)
Hemoglobin: 12.3 g/dL — ABNORMAL LOW (ref 13.0–17.0)
Immature Granulocytes: 0 %
Lymphocytes Relative: 23 %
Lymphs Abs: 1.8 10*3/uL (ref 0.7–4.0)
MCH: 34 pg (ref 26.0–34.0)
MCHC: 33.4 g/dL (ref 30.0–36.0)
MCV: 101.7 fL — ABNORMAL HIGH (ref 80.0–100.0)
Monocytes Absolute: 0.9 10*3/uL (ref 0.1–1.0)
Monocytes Relative: 11 %
Neutro Abs: 5 10*3/uL (ref 1.7–7.7)
Neutrophils Relative %: 64 %
Platelets: 143 10*3/uL — ABNORMAL LOW (ref 150–400)
RBC: 3.62 MIL/uL — ABNORMAL LOW (ref 4.22–5.81)
RDW: 14.3 % (ref 11.5–15.5)
WBC: 7.9 10*3/uL (ref 4.0–10.5)
nRBC: 0 % (ref 0.0–0.2)

## 2021-12-27 LAB — COMPREHENSIVE METABOLIC PANEL
ALT: 19 U/L (ref 0–44)
AST: 22 U/L (ref 15–41)
Albumin: 3.3 g/dL — ABNORMAL LOW (ref 3.5–5.0)
Alkaline Phosphatase: 46 U/L (ref 38–126)
Anion gap: 9 (ref 5–15)
BUN: 10 mg/dL (ref 8–23)
CO2: 23 mmol/L (ref 22–32)
Calcium: 8.4 mg/dL — ABNORMAL LOW (ref 8.9–10.3)
Chloride: 104 mmol/L (ref 98–111)
Creatinine, Ser: 1.04 mg/dL (ref 0.61–1.24)
GFR, Estimated: >60 mL/min (ref >60)
Glucose, Bld: 99 mg/dL (ref 70–99)
Potassium: 3.6 mmol/L (ref 3.5–5.1)
Sodium: 136 mmol/L (ref 135–145)
Total Bilirubin: 1.2 mg/dL (ref 0.3–1.2)
Total Protein: 6 g/dL — ABNORMAL LOW (ref 6.5–8.1)

## 2021-12-27 LAB — D-DIMER, QUANTITATIVE: D-Dimer, Quant: 0.98 ug/mL-FEU — ABNORMAL HIGH (ref 0.00–0.50)

## 2021-12-27 LAB — BRAIN NATRIURETIC PEPTIDE: B Natriuretic Peptide: 143.5 pg/mL — ABNORMAL HIGH (ref 0.0–100.0)

## 2021-12-27 MED ORDER — IOHEXOL 350 MG/ML SOLN
64.0000 mL | Freq: Once | INTRAVENOUS | Status: AC | PRN
  Administered 2021-12-27: 64 mL via INTRAVENOUS

## 2021-12-27 MED ORDER — ALBUTEROL SULFATE HFA 108 (90 BASE) MCG/ACT IN AERS
2.0000 | INHALATION_SPRAY | Freq: Once | RESPIRATORY_TRACT | Status: AC
  Administered 2021-12-27: 2 via RESPIRATORY_TRACT
  Filled 2021-12-27: qty 6.7

## 2021-12-27 NOTE — ED Provider Notes (Signed)
[facility] EMERGENCY DEPARTMENT Provider Note   CSN: [id number] Arrival date & time: 12/27/21  1448     History  Chief Complaint  Patient presents with   Shortness of Breath    Daniel [name] is a 66 y.o. male.  HPI     66 year old male with history of COPD, hypertension, hyperlipidemia, CVA, who presents with concern for shortness of breath which began after having left shoulder surgery 2 days ago.  Reports that shortness of breath began immediately in the postoperative area.  Reports initially they had thought it was due to his nerve block (bupivicaine). Since going home, has had it continue, and today when waking from a nap felt significantly worse.  Oxygen levels at home were between 90 and 93%.  Reports that typically he is around 96% with his history of COPD.  He has a history of COPD with infrequent attacks, takes trilogy, but does not have a rescue inhaler.  His symptoms remind him a bit of when he had asthma, like he just cannot get a deep breath.  Reports some chest discomfort with deep breaths but denies any other chest pain.  Denies nausea, vomiting, abdominal pain, fever, cough, diarrhea.  Reports he was instructed to cough and use his incentive spirometer at time of discharge.  He has had some constipation and has been taking Colace.  Reports he also feels some restriction from his shoulder immobilizer and some abdominal distention due to constipation, but denies any significant abdominal pain.  He has no history of DVT or PE.  Denies any leg pain or asymmetric swelling.  Does note some mild swelling to the lower extremities.  At this time, he feels improved from how he had been feeling earlier.    Past Medical History:  Diagnosis Date   Acute ischemic stroke ([place]) 03/12/2018   BPH (benign prostatic hyperplasia)    COPD (chronic obstructive pulmonary disease) (HCC)    Hyperlipidemia    Hypertension    Pulmonary nodule    renal stents    Stroke  [facility])    TIA (transient ischemic attack) 03/11/2018     Home Medications Prior to Admission medications   Medication Sig Start Date End Date Taking? Authorizing Provider  clobetasol cream (TEMOVATE) 2.53 % Apply 1 application topically 2 (two) times daily. 11/24/21   [name], MD  baclofen (LIORESAL) 10 MG tablet Take 1 tablet (10 mg total) by mouth 3 (three) times daily. 10/06/21   Frann Rider, NP  Cholecalciferol (VITAMIN D) 50 MCG (2000 UT) CAPS Take by mouth.    [provider]  clopidogrel (PLAVIX) 75 MG tablet TAKE 1 TABLET(75 MG) BY MOUTH DAILY 12/22/21   [name], MD  cyclobenzaprine (FLEXERIL) 5 MG tablet Take 5 mg by mouth at bedtime as needed for muscle spasms.  03/26/18   [provider]  Fluticasone-Umeclidin-Vilant (TRELEGY ELLIPTA) 100-62.5-25 MCG/INH AEPB Inhale 1 puff into the lungs daily. 01/15/21   [name], MD  glucosamine-chondroitin 500-400 MG tablet Take 1 tablet by mouth 3 (three) times daily.    [provider]  methocarbamol (ROBAXIN) 500 MG tablet Take 1 tablet (500 mg total) by mouth 2 (two) times daily as needed. 12/19/21   Aundra Dubin, PA-C  metoprolol succinate (TOPROL-XL) 50 MG 24 hr tablet TAKE 1 TABLET(50 MG) BY MOUTH DAILY WITH OR IMMEDIATELY FOLLOWING A MEAL 10/09/21   [name], MD  ondansetron (ZOFRAN) 4 MG tablet Take 1 tablet (4 mg total) by mouth every 8 (  eight) hours as needed for nausea or vomiting. 12/22/21   Aundra Dubin, PA-C  oxyCODONE-acetaminophen (PERCOCET) 5-325 MG tablet Take 1 tablet by mouth every 6 (six) hours as needed. To  be taken after surgery 12/22/21   Aundra Dubin, PA-C  pantoprazole (PROTONIX) 40 MG tablet Take 1 tablet (40 mg total) by mouth daily. For stomach 10/09/21   [name], MD  rosuvastatin (CRESTOR) 40 MG tablet Take 1 tablet (40 mg total) by mouth daily. For cholesterol 07/08/21   [name], MD  SUPER B COMPLEX/C PO Take by mouth.    [provider]       Allergies    Patient has no known allergies.    Review of Systems   Review of Systems See above Physical Exam Updated Vital Signs BP (!) 149/84    Pulse 76    Temp 98.3 F (36.8 C) (Oral)    Resp 20    SpO2 97%  Physical Exam Vitals and nursing note reviewed.  Constitutional:      General: He is not in acute distress.    Appearance: He is well-developed. He is not diaphoretic.  HENT:     Head: Normocephalic and atraumatic.  Eyes:     Conjunctiva/sclera: Conjunctivae normal.  Cardiovascular:     Rate and Rhythm: Normal rate and regular rhythm.     Heart sounds: Normal heart sounds. No murmur heard.   No friction rub. No gallop.  Pulmonary:     Effort: Pulmonary effort is normal. No respiratory distress.     Breath sounds: Normal breath sounds. No wheezing or rales.  Abdominal:     General: There is no distension.     Palpations: Abdomen is soft.     Tenderness: There is no abdominal tenderness. There is no guarding.  Musculoskeletal:     Cervical back: Normal range of motion.     Comments: Shoulder under sling/immobilizer with dressing in place  Skin:    General: Skin is warm and dry.  Neurological:     Mental Status: He is alert and oriented to person, place, and time.    ED Results / Procedures / Treatments   Labs (all labs ordered are listed, but only abnormal results are displayed) Labs Reviewed  CBC WITH DIFFERENTIAL/PLATELET - Abnormal; Notable for the following components:      Result Value   RBC 3.62 (*)    Hemoglobin 12.3 (*)    HCT 36.8 (*)    MCV 101.7 (*)    Platelets 143 (*)    All other components within normal limits  COMPREHENSIVE METABOLIC PANEL - Abnormal; Notable for the following components:   Calcium 8.4 (*)    Total Protein 6.0 (*)    Albumin 3.3 (*)    All other components within normal limits  BRAIN NATRIURETIC PEPTIDE - Abnormal; Notable for the following components:   B Natriuretic Peptide 143.5 (*)    All other components within  normal limits  D-DIMER, QUANTITATIVE - Abnormal; Notable for the following components:   D-Dimer, Quant 0.98 (*)    All other components within normal limits  TROPONIN I (HIGH SENSITIVITY)  TROPONIN I (HIGH SENSITIVITY)    EKG None  Radiology CT Angio Chest PE W and/or Wo Contrast  Result Date: 12/27/2021 CLINICAL DATA:  Chest pain or SOB, pleurisy or effusion suspected. Worsening shortness of breath. EXAM: CT ANGIOGRAPHY CHEST WITH CONTRAST TECHNIQUE: Multidetector CT imaging of the chest was performed using the standard protocol during bolus  administration of intravenous contrast. Multiplanar CT image reconstructions and MIPs were obtained to evaluate the vascular anatomy. RADIATION DOSE REDUCTION: This exam was performed according to the departmental dose-optimization program which includes automated exposure control, adjustment of the mA and/or kV according to patient size and/or use of iterative reconstruction technique. CONTRAST:  10mL OMNIPAQUE IOHEXOL 350 MG/ML SOLN COMPARISON:  05/11/2014 FINDINGS: Cardiovascular: No filling defects in the pulmonary arteries to suggest pulmonary emboli. Heart is normal size. Aorta is normal caliber. Scattered coronary artery and aortic calcifications. Mediastinum/Nodes: No mediastinal, hilar, or axillary adenopathy. Trachea and esophagus are unremarkable. Thyroid unremarkable. Lungs/Pleura: Left basilar atelectasis. No additional confluent opacities, nodules or effusions. Upper Abdomen: No acute findings Musculoskeletal: Chest wall soft tissues are unremarkable. No acute bony abnormality. Review of the MIP images confirms the above findings. IMPRESSION: No evidence of pulmonary embolus. Coronary artery disease. Left base atelectasis. Aortic Atherosclerosis (ICD10-I70.0). Electronically Signed   By: Rolm Baptise M.D.   On: 12/27/2021 20:26   DG Chest Portable 1 View  Result Date: 12/27/2021 CLINICAL DATA:  Worsening shortness of EXAM: PORTABLE CHEST 1 VIEW  COMPARISON:  07/08/2021 FINDINGS: Anterior cervical fusion. Stable cardiac silhouette. New elevation of the LEFT hemidiaphragm. LEFT lung base poorly evaluated. RIGHT lung clear. No pneumothorax. IMPRESSION: New elevation of the LEFT hemidiaphragm. LEFT lung base poorly evaluated. Electronically Signed   By: Suzy Bouchard M.D.   On: 12/27/2021 16:55    Procedures Procedures    Medications Ordered in ED Medications  albuterol (VENTOLIN HFA) 108 (90 Base) MCG/ACT inhaler 2 puff (2 puffs Inhalation Given 12/27/21 1821)  iohexol (OMNIPAQUE) 350 MG/ML injection 64 mL (64 mLs Intravenous Contrast Given 12/27/21 2023)    ED Course/ Medical Decision Making/ A&P                           Medical Decision Making Amount and/or Complexity of Data Reviewed Labs: ordered. Radiology: ordered.  Risk Prescription drug management.    66 year old male with history of COPD, hypertension, hyperlipidemia, CVA, who presents with concern for shortness of breath which began after having left shoulder surgery 2 days ago.  Differential diagnosis for dyspnea includes ACS, PE, pneumothorax, complication of anesthesia/nerve block/phrenic nerve, COPD exacerbation, CHF exacerbation, anemia, pneumonia, viral etiology such as COVID 19 infection, metabolic abnormality.    Chest x-ray was done and personally evaluated by me which showed new elevation of left hemidiaphragm. No leukocytosis, cough or fever and doubt pneumonia.   EKG was evaluated by me which showed no acute findings.  BNP was not significantly elevated, clinically low suspicoin for CHF.  DDimer tested given recent surgery, worsening dyspnea today and positive, CT PE study completed showing no PE, does show CAD.  Denies CP, troponin negative, doubt ACS.   CBC shows hgb 12.3 from previous of 15.3, postoperatively.    Suspect dyspnea related to underlying lung disease/COPD with phrenic nerve paralysis/hemidiaphragm elevation in setting of recent nerve  block and use of Exparel which can last 2-3 days, consider other nerve injury if not improving. Discussed with anethesia. At this time recommend continued supportive car, may encourage spirometry, albuterol use.  Other contributing factors may be anemia.    Hemodynamically stable with normal oxygen saturations in ED.  Given albuterol inhaler. Given cardiology number for follow up given noted CAD on CT. Patient discharged in stable condition with understanding of reasons to return.         Final Clinical Impression(s) / ED Diagnoses  Final diagnoses:  Phrenic nerve palsy  Chronic obstructive pulmonary disease, unspecified COPD type ([place])  Atelectasis  Dyspnea, unspecified type    Rx / DC Orders ED Discharge Orders     None         Gareth Morgan, MD 12/28/21 0040

## 2021-12-27 NOTE — ED Notes (Signed)
Pt verbalizes understanding of discharge instructions. Opportunity for questions and answers were provided. Pt discharged from the ED.   ?

## 2021-12-27 NOTE — ED Triage Notes (Signed)
Pt here for worsening shob after having rotator cuff surgery on Thursday. PT reports it is worse w/ exertion but is constant. Pt has hx COPD, states his O2 saturation at home was 90% on RA.

## 2021-12-29 ENCOUNTER — Other Ambulatory Visit: Payer: Self-pay | Admitting: Family Medicine

## 2021-12-30 ENCOUNTER — Telehealth: Payer: Self-pay

## 2021-12-30 NOTE — Telephone Encounter (Signed)
Patient's wife called concerning patient's health since having surgery on Thursday, 12/25/2021.  Would like a call back to discuss.  CB# [phone].  Please advise.  Thank you.

## 2021-12-31 NOTE — Telephone Encounter (Signed)
Would you mind giving them a call to find out what's going on.  Wonder if they need to see his PCP or call [facility] to talk to the anesthesia people about it.  The paralysis shouldn't last that long.  Thank you.

## 2021-12-31 NOTE — Telephone Encounter (Signed)
Daniel [name] spoke to patient ?

## 2021-12-31 NOTE — Telephone Encounter (Signed)
Spoke to patient.  He is seeing pcp tomorrow

## 2022-01-01 ENCOUNTER — Ambulatory Visit (INDEPENDENT_AMBULATORY_CARE_PROVIDER_SITE_OTHER): Payer: Medicare HMO | Admitting: Family Medicine

## 2022-01-01 ENCOUNTER — Encounter: Payer: Self-pay | Admitting: Physician Assistant

## 2022-01-01 ENCOUNTER — Ambulatory Visit (INDEPENDENT_AMBULATORY_CARE_PROVIDER_SITE_OTHER): Payer: Medicare HMO

## 2022-01-01 ENCOUNTER — Other Ambulatory Visit: Payer: Self-pay

## 2022-01-01 ENCOUNTER — Ambulatory Visit (INDEPENDENT_AMBULATORY_CARE_PROVIDER_SITE_OTHER): Payer: Medicare HMO | Admitting: Physician Assistant

## 2022-01-01 ENCOUNTER — Encounter: Payer: Self-pay | Admitting: Family Medicine

## 2022-01-01 VITALS — BP 113/64 | HR 73 | Ht 70.5 in | Wt 217.0 lb

## 2022-01-01 DIAGNOSIS — R0602 Shortness of breath: Secondary | ICD-10-CM

## 2022-01-01 DIAGNOSIS — Z9889 Other specified postprocedural states: Secondary | ICD-10-CM

## 2022-01-01 MED ORDER — HYDROCODONE-ACETAMINOPHEN 5-325 MG PO TABS: 1.0000 | ORAL_TABLET | Freq: Three times a day (TID) | ORAL | 0 refills | Status: DC | PRN

## 2022-01-01 NOTE — Progress Notes (Signed)
? ?Post-Op Visit Note ?  ?Patient: Daniel [name]           ?Date of Birth: Jul 18, 1956           ?MRN: [id number] ?Visit Date: 01/01/2022 ?PCP: [name], MD ? ? ?Assessment & Plan: ? ?Chief Complaint:  ?Chief Complaint  ?Patient presents with  ? Left Shoulder - Follow-up  ?  Left shoulder arthroscopy 12/25/2021  ? ?Visit Diagnoses:  ?1. S/P rotator cuff repair   ?2. S/P arthroscopy of left shoulder   ? ? ?Plan: Patient is a pleasant 66 year old gentleman who comes in today 1 week status post left shoulder open biceps tenodesis and rotator cuff repair 12/25/2021.  He has been doing well in regards to the shoulder.  He has however been experiencing shortness of breath since the initial postoperative period in PACU.  He did undergo a block with Exparel prior to surgery.  He has underlying COPD but notes that his typical symptoms increasingly worsened to the point where he was seen in the ED Saturday night.  He underwent CTA which was negative for PE.  He notes that his symptoms have slightly improved over the past few days.  He is scheduled to see his primary care provider this afternoon.  In regards to the shoulder, he has been taking ibuprofen and Tylenol as well as an occasional Norco.  He has been compliant wearing his sling.  Examination of his left shoulder reveals well-healing surgical incisions with nylon sutures in place.  No evidence of infection or cellulitis.  He does have moderate ecchymosis and swelling to the left upper extremity.  He is neurovascularly intact distally.  Today, portal sutures were removed and Steri-Strips applied.  The open incision was recovered.  He will follow-up with [place] next week for suture removal.  In the meantime, he will continue wearing his sling for the next 5 weeks.  Physical therapy referral has been made.  Call with concerns or questions in the meantime. ? ?Follow-Up Instructions: Return in about 5 weeks (around 02/05/2022).  ? ?Orders:  ?No orders of the defined types  were placed in this encounter. ? ?No orders of the defined types were placed in this encounter. ? ? ?Imaging: ?No new imaging ? ?PMFS History: ?Patient Active Problem List  ? Diagnosis Date Noted  ? Nontraumatic tear of supraspinatus tendon, left 11/12/2021  ? Tendinopathy of left biceps 11/12/2021  ? Restless leg syndrome 09/09/2018  ? Right sided weakness 03/11/2018  ? Alcohol abuse 03/11/2018  ? Hypertension 02/07/2018  ? Cervical stenosis of spine 01/17/2016  ? BPH (benign prostatic hyperplasia) 07/20/2013  ? Abnormal chest CT 07/20/2013  ? Hyperlipidemia 07/20/2013  ? COPD (chronic obstructive pulmonary disease) ([place]) 12/08/2011  ? Smoker 12/08/2011  ? Multiple pulmonary nodules 12/08/2011  ? ?Past Medical History:  ?Diagnosis Date  ? Acute ischemic stroke ([place]) 03/12/2018  ? BPH (benign prostatic hyperplasia)   ? COPD (chronic obstructive pulmonary disease) ([place])   ? Hyperlipidemia   ? Hypertension   ? Pulmonary nodule   ? renal stents   ? Stroke [facility])   ? TIA (transient ischemic attack) 03/11/2018  ?  ?Family History  ?Problem Relation Age of Onset  ? Emphysema Father   ? Cancer Father   ? Lung cancer Mother   ?  ?Past Surgical History:  ?Procedure Laterality Date  ? CERVICAL FUSION    ? NO PAST SURGERIES    ? renal stents    ? ?Social History  ? ?Occupational  History  ? Occupation: Museum/gallery curator  ?Tobacco Use  ? Smoking status: Former  ?  Packs/day: 1.00  ?  Years: 42.00  ?  Pack years: 42.00  ?  Types: Cigarettes  ? Smokeless tobacco: Never  ?Substance and Sexual Activity  ? Alcohol use: Never  ?  Comment: not dirnking now  ? Drug use: Never  ? Sexual activity: Not on file  ? ? ? ?

## 2022-01-01 NOTE — Progress Notes (Signed)
? ?BP 113/64   Pulse 73   Ht 5' 10.5" (1.791 m)   Wt 217 lb (98.4 kg)   SpO2 99%   BMI 30.70 kg/m?   ? ?Subjective:  ? ?Patient ID: Daniel [name], male    DOB: 1956-06-23, 66 y.o.   MRN: [id number] ? ?HPI: ?Daniel [name] is a 66 y.o. male presenting on 01/01/2022 for Shortness of Breath ? ? ?HPI ?Patient is coming today with complaints of shortness of breath after surgery.  He had his rotator cuff surgery done just over a week ago.  He did have nerve block for the surgery rather than full sedation.  It looks like he was seen in the emergency department 2 days after the surgery about 5 days ago and was looked at for shortness of breath and had a chest x-ray and then a CT scan and there was some concern about left hemidiaphragm paralysis.  He says he still is somewhat short of breath and it is improved but he still more short of breath than his baseline. ? ?Relevant past medical, surgical, family and social history reviewed and updated as indicated. Interim medical history since our last visit reviewed. ?Allergies and medications reviewed and updated. ? ?Review of Systems  ?Constitutional:  Negative for chills and fever.  ?HENT:  Negative for congestion.   ?Eyes:  Negative for visual disturbance.  ?Respiratory:  Positive for shortness of breath. Negative for cough and wheezing.   ?Cardiovascular:  Positive for leg swelling. Negative for chest pain.  ?Musculoskeletal:  Negative for back pain and gait problem.  ?Skin:  Negative for rash.  ?Neurological:  Negative for dizziness, weakness and light-headedness.  ?All other systems reviewed and are negative. ? ?Per HPI unless specifically indicated above ? ? ?Allergies as of 01/01/2022   ?No Known Allergies ?  ? ?  ?Medication List  ?  ? ?  ? Accurate as of January 01, 2022  2:39 PM. If you have any questions, ask your nurse or doctor.  ?  ?  ? ?  ? ?baclofen 10 MG tablet ?Commonly known as: LIORESAL ?Take 1 tablet (10 mg total) by mouth 3 (three) times daily. ?   ?clobetasol cream 0.05 % ?Commonly known as: TEMOVATE ?Apply 1 application topically 2 (two) times daily. ?  ?clopidogrel 75 MG tablet ?Commonly known as: PLAVIX ?TAKE 1 TABLET(75 MG) BY MOUTH DAILY ?  ?cyclobenzaprine 5 MG tablet ?Commonly known as: FLEXERIL ?Take 5 mg by mouth at bedtime as needed for muscle spasms. ?  ?glucosamine-chondroitin 500-400 MG tablet ?Take 1 tablet by mouth 3 (three) times daily. ?  ?HYDROcodone-acetaminophen 5-325 MG tablet ?Commonly known as: Norco ?Take 1 tablet by mouth 3 (three) times daily as needed. ?Started by: Aundra Dubin, PA-C ?  ?methocarbamol 500 MG tablet ?Commonly known as: Robaxin ?Take 1 tablet (500 mg total) by mouth 2 (two) times daily as needed. ?  ?metoprolol succinate 50 MG 24 hr tablet ?Commonly known as: TOPROL-XL ?TAKE 1 TABLET(50 MG) BY MOUTH DAILY WITH OR IMMEDIATELY FOLLOWING A MEAL ?  ?ondansetron 4 MG tablet ?Commonly known as: Zofran ?Take 1 tablet (4 mg total) by mouth every 8 (eight) hours as needed for nausea or vomiting. ?  ?oxyCODONE-acetaminophen 5-325 MG tablet ?Commonly known as: Percocet ?Take 1 tablet by mouth every 6 (six) hours as needed. To  be taken after surgery ?  ?pantoprazole 40 MG tablet ?Commonly known as: PROTONIX ?Take 1 tablet (40 mg total) by mouth daily. For stomach ?  ?  rosuvastatin 40 MG tablet ?Commonly known as: Crestor ?Take 1 tablet (40 mg total) by mouth daily. For cholesterol ?  ?SUPER B COMPLEX/C PO ?Take by mouth. ?  ?Trelegy Ellipta 100-62.5-25 MCG/ACT Aepb ?Generic drug: Fluticasone-Umeclidin-Vilant ?INHALE 1 PUFF INTO THE LUNGS DAILY ?  ?Vitamin D 50 MCG (2000 UT) Caps ?Take by mouth. ?  ? ?  ? ? ? ?Objective:  ? ?BP 113/64   Pulse 73   Ht 5' 10.5" (1.791 m)   Wt 217 lb (98.4 kg)   SpO2 99%   BMI 30.70 kg/m?   ?Wt Readings from Last 3 Encounters:  ?01/01/22 217 lb (98.4 kg)  ?10/09/21 213 lb 6.4 oz (96.8 kg)  ?10/06/21 215 lb 4 oz (97.6 kg)  ?  ?Physical Exam ?Vitals and nursing note reviewed.  ?Constitutional:    ?   General: He is not in acute distress. ?   Appearance: He is well-developed. He is not diaphoretic.  ?Eyes:  ?   General: No scleral icterus.    ?   Right eye: No discharge.  ?   Conjunctiva/sclera: Conjunctivae normal.  ?   Pupils: Pupils are equal, round, and reactive to light.  ?Neck:  ?   Thyroid: No thyromegaly.  ?Cardiovascular:  ?   Rate and Rhythm: Normal rate and regular rhythm.  ?   Heart sounds: Normal heart sounds. No murmur heard. ?Pulmonary:  ?   Effort: Pulmonary effort is normal. No respiratory distress.  ?   Breath sounds: Normal breath sounds. No wheezing.  ?Musculoskeletal:     ?   General: Normal range of motion.  ?   Cervical back: Neck supple.  ?Lymphadenopathy:  ?   Cervical: No cervical adenopathy.  ?Skin: ?   General: Skin is warm and dry.  ?   Findings: No rash.  ?Neurological:  ?   Mental Status: He is alert and oriented to person, place, and time.  ?   Coordination: Coordination normal.  ?Psychiatric:     ?   Behavior: Behavior normal.  ? ? ?Chest x-ray: Looks clear today, looks much better than x-ray in the ER. ? ?Assessment & Plan:  ? ?Problem List Items Addressed This Visit   ?None ?Visit Diagnoses   ? ? Shortness of breath    -  Primary  ? Relevant Orders  ? DG Chest 2 View  ? CBC with Differential/Platelet  ? CMP14+EGFR  ? ?  ?  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  His breathing is improving, recommended that he follow-up with PCP but also let us know if his breathing does not continue to improve or if it worsens at all. ?Follow up plan: ?Return if symptoms worsen or fail to improve, for 1 to 13-monthfollow-up with PCP on breathing. ? ?Counseling provided for all of the vaccine components ?Orders Placed This Encounter  ?Procedures  ? DG Chest 2 View  ? CBC with Differential/Platelet  ? CMP14+EGFR  ? ? ?JCaryl Pina MD ?[place]?01/01/2022, 2:39 PM ? ? ?  ?

## 2022-01-02 LAB — CBC WITH DIFFERENTIAL/PLATELET
Basophils Absolute: 0 10*3/uL (ref 0.0–0.2)
Basos: 1 %
EOS (ABSOLUTE): 0.1 10*3/uL (ref 0.0–0.4)
Eos: 1 %
Hematocrit: 38.9 % (ref 37.5–51.0)
Hemoglobin: 13.5 g/dL (ref 13.0–17.7)
Immature Grans (Abs): 0 10*3/uL (ref 0.0–0.1)
Immature Granulocytes: 0 %
Lymphocytes Absolute: 1.2 10*3/uL (ref 0.7–3.1)
Lymphs: 19 %
MCH: 32.9 pg (ref 26.6–33.0)
MCHC: 34.7 g/dL (ref 31.5–35.7)
MCV: 95 fL (ref 79–97)
Monocytes Absolute: 0.7 10*3/uL (ref 0.1–0.9)
Monocytes: 11 %
Neutrophils Absolute: 4.4 10*3/uL (ref 1.4–7.0)
Neutrophils: 68 %
Platelets: 231 10*3/uL (ref 150–450)
RBC: 4.1 x10E6/uL — ABNORMAL LOW (ref 4.14–5.80)
RDW: 13.2 % (ref 11.6–15.4)
WBC: 6.3 10*3/uL (ref 3.4–10.8)

## 2022-01-02 LAB — CMP14+EGFR
ALT: 22 IU/L (ref 0–44)
AST: 25 IU/L (ref 0–40)
Albumin/Globulin Ratio: 1.8 (ref 1.2–2.2)
Albumin: 4.5 g/dL (ref 3.8–4.8)
Alkaline Phosphatase: 81 IU/L (ref 44–121)
BUN/Creatinine Ratio: 11 (ref 10–24)
BUN: 12 mg/dL (ref 8–27)
Bilirubin Total: 1.5 mg/dL — ABNORMAL HIGH (ref 0.0–1.2)
CO2: 20 mmol/L (ref 20–29)
Calcium: 9.2 mg/dL (ref 8.6–10.2)
Chloride: 103 mmol/L (ref 96–106)
Creatinine, Ser: 1.08 mg/dL (ref 0.76–1.27)
Globulin, Total: 2.5 g/dL (ref 1.5–4.5)
Glucose: 94 mg/dL (ref 70–99)
Potassium: 4.4 mmol/L (ref 3.5–5.2)
Sodium: 139 mmol/L (ref 134–144)
Total Protein: 7 g/dL (ref 6.0–8.5)
eGFR: 76 mL/min/{1.73_m2} (ref >59)

## 2022-01-05 ENCOUNTER — Telehealth: Payer: Self-pay | Admitting: Orthopaedic Surgery

## 2022-01-05 NOTE — Telephone Encounter (Signed)
P.T. referral & OP note faxed to [facility] physical therapy [phone] ?

## 2022-01-08 DIAGNOSIS — M75122 Complete rotator cuff tear or rupture of left shoulder, not specified as traumatic: Secondary | ICD-10-CM | POA: Diagnosis not present

## 2022-01-09 ENCOUNTER — Other Ambulatory Visit: Payer: Self-pay

## 2022-01-09 ENCOUNTER — Encounter: Payer: Self-pay | Admitting: Orthopaedic Surgery

## 2022-01-09 ENCOUNTER — Ambulatory Visit (INDEPENDENT_AMBULATORY_CARE_PROVIDER_SITE_OTHER): Payer: Medicare HMO | Admitting: Physician Assistant

## 2022-01-09 DIAGNOSIS — M67814 Other specified disorders of tendon, left shoulder: Secondary | ICD-10-CM

## 2022-01-09 DIAGNOSIS — Z9889 Other specified postprocedural states: Secondary | ICD-10-CM

## 2022-01-09 NOTE — Progress Notes (Signed)
? ?  Post-Op Visit Note ?  ?Patient: Daniel [name]           ?Date of Birth: 06/21/1956           ?MRN: [id number] ?Visit Date: 01/09/2022 ?PCP: [name], MD ? ? ?Assessment & Plan: ? ?Chief Complaint:  ?Chief Complaint  ?Patient presents with  ? Left Shoulder - Pain  ? ?Visit Diagnoses:  ?1. S/P rotator cuff repair   ?2. Biceps tendonosis of left shoulder   ? ? ?Plan: Patient is a pleasant 66 year old gentleman who comes in today 2 weeks status post left shoulder arthroscopic debridement and open biceps tenodesis and rotator cuff repair 12/25/2021.  He has been doing well in regards to the left shoulder.  He has been compliant wearing his sling.  He started physical therapy yesterday.  In regards to the postoperative shortness of breath, he feels he is getting back to baseline.  He has been seeing his primary care provider for this to thinks this was likely a result of the block.  Examination of the left shoulder reveals fully healed surgical scar with nylon sutures in place.  No evidence of infection or cellulitis.  At this point, sutures removed and Steri-Strips applied.  Continue wearing the sling for another 4 weeks.  Continue with physical therapy.  Follow-up with [place] in 4 weeks time for recheck.  Call with concerns or questions. ? ?Follow-Up Instructions: Return in about 4 weeks (around 02/06/2022).  ? ?Orders:  ?No orders of the defined types were placed in this encounter. ? ?No orders of the defined types were placed in this encounter. ? ? ?Imaging: ?No new imaging ? ?PMFS History: ?Patient Active Problem List  ? Diagnosis Date Noted  ? Nontraumatic tear of supraspinatus tendon, left 11/12/2021  ? Tendinopathy of left biceps 11/12/2021  ? Restless leg syndrome 09/09/2018  ? Right sided weakness 03/11/2018  ? Alcohol abuse 03/11/2018  ? Hypertension 02/07/2018  ? Cervical stenosis of spine 01/17/2016  ? BPH (benign prostatic hyperplasia) 07/20/2013  ? Abnormal chest CT 07/20/2013  ? Hyperlipidemia  07/20/2013  ? COPD (chronic obstructive pulmonary disease) ([place]) 12/08/2011  ? Smoker 12/08/2011  ? Multiple pulmonary nodules 12/08/2011  ? ?Past Medical History:  ?Diagnosis Date  ? Acute ischemic stroke ([place]) 03/12/2018  ? BPH (benign prostatic hyperplasia)   ? COPD (chronic obstructive pulmonary disease) ([place])   ? Hyperlipidemia   ? Hypertension   ? Pulmonary nodule   ? renal stents   ? Stroke [facility])   ? TIA (transient ischemic attack) 03/11/2018  ?  ?Family History  ?Problem Relation Age of Onset  ? Emphysema Father   ? Cancer Father   ? Lung cancer Mother   ?  ?Past Surgical History:  ?Procedure Laterality Date  ? CERVICAL FUSION    ? NO PAST SURGERIES    ? renal stents    ? ?Social History  ? ?Occupational History  ? Occupation: Museum/gallery curator  ?Tobacco Use  ? Smoking status: Former  ?  Packs/day: 1.00  ?  Years: 42.00  ?  Pack years: 42.00  ?  Types: Cigarettes  ? Smokeless tobacco: Never  ?Substance and Sexual Activity  ? Alcohol use: Never  ?  Comment: not dirnking now  ? Drug use: Never  ? Sexual activity: Not on file  ? ? ? ?

## 2022-01-10 DIAGNOSIS — M75122 Complete rotator cuff tear or rupture of left shoulder, not specified as traumatic: Secondary | ICD-10-CM | POA: Diagnosis not present

## 2022-01-13 DIAGNOSIS — M75122 Complete rotator cuff tear or rupture of left shoulder, not specified as traumatic: Secondary | ICD-10-CM | POA: Diagnosis not present

## 2022-01-16 DIAGNOSIS — M75122 Complete rotator cuff tear or rupture of left shoulder, not specified as traumatic: Secondary | ICD-10-CM | POA: Diagnosis not present

## 2022-01-20 DIAGNOSIS — M75122 Complete rotator cuff tear or rupture of left shoulder, not specified as traumatic: Secondary | ICD-10-CM | POA: Diagnosis not present

## 2022-01-23 DIAGNOSIS — M75122 Complete rotator cuff tear or rupture of left shoulder, not specified as traumatic: Secondary | ICD-10-CM | POA: Diagnosis not present

## 2022-01-26 ENCOUNTER — Other Ambulatory Visit: Payer: Self-pay | Admitting: Family Medicine

## 2022-01-27 DIAGNOSIS — M75122 Complete rotator cuff tear or rupture of left shoulder, not specified as traumatic: Secondary | ICD-10-CM | POA: Diagnosis not present

## 2022-01-30 DIAGNOSIS — M75122 Complete rotator cuff tear or rupture of left shoulder, not specified as traumatic: Secondary | ICD-10-CM | POA: Diagnosis not present

## 2022-02-03 DIAGNOSIS — M75122 Complete rotator cuff tear or rupture of left shoulder, not specified as traumatic: Secondary | ICD-10-CM | POA: Diagnosis not present

## 2022-02-05 ENCOUNTER — Encounter: Payer: Self-pay | Admitting: Orthopaedic Surgery

## 2022-02-05 ENCOUNTER — Ambulatory Visit (INDEPENDENT_AMBULATORY_CARE_PROVIDER_SITE_OTHER): Payer: Medicare HMO | Admitting: Orthopaedic Surgery

## 2022-02-05 DIAGNOSIS — Z9889 Other specified postprocedural states: Secondary | ICD-10-CM | POA: Insufficient documentation

## 2022-02-05 DIAGNOSIS — M67814 Other specified disorders of tendon, left shoulder: Secondary | ICD-10-CM

## 2022-02-05 NOTE — Progress Notes (Signed)
? ?  Post-Op Visit Note ?  ?Patient: Daniel [name]           ?Date of Birth: 05/20/1956           ?MRN: [id number] ?Visit Date: 02/05/2022 ?PCP: [name], MD ? ? ?Assessment & Plan: ? ?Chief Complaint:  ?Chief Complaint  ?Patient presents with  ? Left Shoulder - Follow-up  ?  Left shoulder arthroscopy 12/25/2021  ? ?Visit Diagnoses:  ?1. S/P rotator cuff repair   ?2. Biceps tendonosis of left shoulder   ?3. S/P arthroscopy of left shoulder   ? ? ?Plan: [name] is 6 weeks status post left rotator cuff repair biceps tenodesis.  Is currently doing physical therapy at [facility] twice a week in addition to home exercises.  He is taking ibuprofen twice a day as needed.  No real complaints.  He has been increasing his activity at home. ? ?Examination of left shoulder shows fully healed surgical scars.  Very mild Popeye deformity.  Passive abduction to 50 degrees external rotation to 15 degrees for flexion to 95 degrees.  He has good activation of his cuff. ? ?Patient is recovering well from the surgery and progressing as expected.  He will continue to do outpatient PT and home exercises.  Activity restrictions were reviewed with the patient.  Recheck in 6 weeks.  At this point he can wean the sling as well. ? ?Follow-Up Instructions: Return in about 6 weeks (around 03/19/2022).  ? ?Orders:  ?No orders of the defined types were placed in this encounter. ? ?No orders of the defined types were placed in this encounter. ? ? ?Imaging: ?No results found. ? ?PMFS History: ?Patient Active Problem List  ? Diagnosis Date Noted  ? S/P rotator cuff repair 02/05/2022  ? Biceps tendonosis of left shoulder 02/05/2022  ? S/P arthroscopy of left shoulder 02/05/2022  ? Nontraumatic tear of supraspinatus tendon, left 11/12/2021  ? Tendinopathy of left biceps 11/12/2021  ? Restless leg syndrome 09/09/2018  ? Right sided weakness 03/11/2018  ? Alcohol abuse 03/11/2018  ? Hypertension 02/07/2018  ? Cervical stenosis of spine  01/17/2016  ? BPH (benign prostatic hyperplasia) 07/20/2013  ? Abnormal chest CT 07/20/2013  ? Hyperlipidemia 07/20/2013  ? COPD (chronic obstructive pulmonary disease) ([place]) 12/08/2011  ? Smoker 12/08/2011  ? Multiple pulmonary nodules 12/08/2011  ? ?Past Medical History:  ?Diagnosis Date  ? Acute ischemic stroke ([place]) 03/12/2018  ? BPH (benign prostatic hyperplasia)   ? COPD (chronic obstructive pulmonary disease) ([place])   ? Hyperlipidemia   ? Hypertension   ? Pulmonary nodule   ? renal stents   ? Stroke [facility])   ? TIA (transient ischemic attack) 03/11/2018  ?  ?Family History  ?Problem Relation Age of Onset  ? Emphysema Father   ? Cancer Father   ? Lung cancer Mother   ?  ?Past Surgical History:  ?Procedure Laterality Date  ? CERVICAL FUSION    ? NO PAST SURGERIES    ? renal stents    ? ?Social History  ? ?Occupational History  ? Occupation: Museum/gallery curator  ?Tobacco Use  ? Smoking status: Former  ?  Packs/day: 1.00  ?  Years: 42.00  ?  Pack years: 42.00  ?  Types: Cigarettes  ? Smokeless tobacco: Never  ?Substance and Sexual Activity  ? Alcohol use: Never  ?  Comment: not dirnking now  ? Drug use: Never  ? Sexual activity: Not on file  ? ? ? ?

## 2022-02-06 DIAGNOSIS — M75122 Complete rotator cuff tear or rupture of left shoulder, not specified as traumatic: Secondary | ICD-10-CM | POA: Diagnosis not present

## 2022-02-12 DIAGNOSIS — M75122 Complete rotator cuff tear or rupture of left shoulder, not specified as traumatic: Secondary | ICD-10-CM | POA: Diagnosis not present

## 2022-02-13 DIAGNOSIS — M75122 Complete rotator cuff tear or rupture of left shoulder, not specified as traumatic: Secondary | ICD-10-CM | POA: Diagnosis not present

## 2022-02-17 DIAGNOSIS — M75122 Complete rotator cuff tear or rupture of left shoulder, not specified as traumatic: Secondary | ICD-10-CM | POA: Diagnosis not present

## 2022-02-20 DIAGNOSIS — M75122 Complete rotator cuff tear or rupture of left shoulder, not specified as traumatic: Secondary | ICD-10-CM | POA: Diagnosis not present

## 2022-02-24 DIAGNOSIS — M75122 Complete rotator cuff tear or rupture of left shoulder, not specified as traumatic: Secondary | ICD-10-CM | POA: Diagnosis not present

## 2022-02-25 ENCOUNTER — Other Ambulatory Visit: Payer: Self-pay | Admitting: Family Medicine

## 2022-02-25 MED ORDER — TRELEGY ELLIPTA 100-62.5-25 MCG/ACT IN AEPB: 1.0000 | INHALATION_SPRAY | Freq: Every day | RESPIRATORY_TRACT | 0 refills | Status: DC

## 2022-02-25 NOTE — Telephone Encounter (Signed)
Appt made on 03-03-2022 w/Stacks ?

## 2022-02-25 NOTE — Addendum Note (Signed)
Addended by: Antonietta Barcelona D on: 02/25/2022 04:59 PM ? ? Modules accepted: Orders ? ?

## 2022-02-25 NOTE — Telephone Encounter (Signed)
Stacks. NTBS 30 days given 01/26/22 ?

## 2022-02-27 DIAGNOSIS — M75122 Complete rotator cuff tear or rupture of left shoulder, not specified as traumatic: Secondary | ICD-10-CM | POA: Diagnosis not present

## 2022-03-03 ENCOUNTER — Encounter: Payer: Self-pay | Admitting: Family Medicine

## 2022-03-03 ENCOUNTER — Ambulatory Visit (INDEPENDENT_AMBULATORY_CARE_PROVIDER_SITE_OTHER): Payer: Medicare HMO | Admitting: Family Medicine

## 2022-03-03 VITALS — BP 117/66 | HR 72 | Temp 97.6°F | Ht 70.5 in | Wt 210.0 lb

## 2022-03-03 DIAGNOSIS — I10 Essential (primary) hypertension: Secondary | ICD-10-CM

## 2022-03-03 DIAGNOSIS — J449 Chronic obstructive pulmonary disease, unspecified: Secondary | ICD-10-CM

## 2022-03-03 DIAGNOSIS — E785 Hyperlipidemia, unspecified: Secondary | ICD-10-CM

## 2022-03-03 DIAGNOSIS — M75122 Complete rotator cuff tear or rupture of left shoulder, not specified as traumatic: Secondary | ICD-10-CM | POA: Diagnosis not present

## 2022-03-03 MED ORDER — CLOPIDOGREL BISULFATE 75 MG PO TABS: ORAL_TABLET | ORAL | 3 refills | Status: DC

## 2022-03-03 MED ORDER — TRELEGY ELLIPTA 100-62.5-25 MCG/ACT IN AEPB: 1.0000 | INHALATION_SPRAY | Freq: Every day | RESPIRATORY_TRACT | 3 refills | Status: DC

## 2022-03-03 NOTE — Progress Notes (Signed)
? ?Subjective:  ?Patient ID: Daniel [name], male    DOB: August 03, 1956  Age: 66 y.o. MRN: [id number] ? ?CC: Medication Refill ? ? ?HPI ?Daniel [name] presents for . presents for  follow-up of hypertension. Patient has no history of headache chest pain or shortness of breath or recent cough. Patient also denies symptoms of TIA such as focal numbness or weakness. Patient denies side effects from medication. States taking it regularly. ? ? in for follow-up of elevated cholesterol. Doing well without complaints on current medication. Denies side effects of statin including myalgia and arthralgia and nausea. Currently no chest pain, shortness of breath or other cardiovascular related symptoms noted. ? ?COPD stable. Needs longer term supply of the trelegy. Breathing still short since surgery in Feb. Pulse ox runs 95-99 daily on RA by home measurement. ? ? ?  01/01/2022  ?  1:22 PM 10/09/2021  ?  2:06 PM 07/08/2021  ?  9:24 AM  ?Depression screen PHQ 2/9  ?Decreased Interest 0 0 0  ?Down, Depressed, Hopeless 0 0 0  ?PHQ - 2 Score 0 0 0  ? ? ?History ?Daniel [name] has a past medical history of Acute ischemic stroke ([place]) (03/12/2018), BPH (benign prostatic hyperplasia), COPD (chronic obstructive pulmonary disease) ([place]), Hyperlipidemia, Hypertension, Pulmonary nodule, renal stents, Stroke ([place]), and TIA (transient ischemic attack) (03/11/2018).  ? ?He has a past surgical history that includes No past surgeries; renal stents; and Cervical fusion.  ? ?His family history includes Cancer in his father; Emphysema in his father; Lung cancer in his mother.He reports that he has quit smoking. His smoking use included cigarettes. He has a 42.00 pack-year smoking history. He has never used smokeless tobacco. He reports that he does not drink alcohol and does not use drugs. ? ? ? ?ROS ?Review of Systems  ?Constitutional:  Negative for fever.  ?Respiratory:  Negative for shortness of breath.   ?Cardiovascular:  Negative for chest pain.   ?Musculoskeletal:  Negative for arthralgias.  ?Skin:  Negative for rash.  ? ?Objective:  ?BP 117/66   Pulse 72   Temp 97.6 ?F (36.4 ?C)   Ht 5' 10.5" (1.791 m)   Wt 210 lb (95.3 kg)   SpO2 97%   BMI 29.71 kg/m?  ? ?BP Readings from Last 3 Encounters:  ?03/03/22 117/66  ?01/01/22 113/64  ?12/27/21 (!) 149/84  ? ? ?Wt Readings from Last 3 Encounters:  ?03/03/22 210 lb (95.3 kg)  ?01/01/22 217 lb (98.4 kg)  ?10/09/21 213 lb 6.4 oz (96.8 kg)  ? ? ? ?Physical Exam ?Vitals reviewed.  ?Constitutional:   ?   Appearance: He is well-developed.  ?HENT:  ?   Head: Normocephalic and atraumatic.  ?   Right Ear: External ear normal.  ?   Left Ear: External ear normal.  ?   Mouth/Throat:  ?   Pharynx: No oropharyngeal exudate or posterior oropharyngeal erythema.  ?Eyes:  ?   Pupils: Pupils are equal, round, and reactive to light.  ?Cardiovascular:  ?   Rate and Rhythm: Normal rate and regular rhythm.  ?   Heart sounds: No murmur heard. ?Pulmonary:  ?   Effort: No respiratory distress.  ?   Breath sounds: Normal breath sounds.  ?Musculoskeletal:  ?   Cervical back: Normal range of motion and neck supple.  ?Neurological:  ?   Mental Status: He is alert and oriented to person, place, and time.  ? ? ? ? ?Assessment & Plan:  ? ?Daniel [name] was seen today for medication  refill. ? ?Diagnoses and all orders for this visit: ? ?Chronic obstructive pulmonary disease, unspecified COPD type ([place]) ?-     CBC with Differential/Platelet ?-     CMP14+EGFR ? ?Primary hypertension ?-     CBC with Differential/Platelet ?-     CMP14+EGFR ?-     Lipid panel ? ?Hyperlipidemia, unspecified hyperlipidemia type ?-     CBC with Differential/Platelet ?-     CMP14+EGFR ?-     Lipid panel ? ?Other orders ?-     clopidogrel (PLAVIX) 75 MG tablet; TAKE 1 TABLET(75 MG) BY MOUTH DAILY ?-     Fluticasone-Umeclidin-Vilant (TRELEGY ELLIPTA) 100-62.5-25 MCG/ACT AEPB; Inhale 1 puff into the lungs daily. ? ? ? ? ? ? ?I am having Daniel Devol "[name]" maintain his  SUPER B COMPLEX/C PO, glucosamine-chondroitin, Vitamin D, rosuvastatin, baclofen, metoprolol succinate, pantoprazole, clobetasol cream, methocarbamol, ondansetron, clopidogrel, and Trelegy Ellipta. ? ?Allergies as of 03/03/2022   ?No Known Allergies ?  ? ?  ?Medication List  ?  ? ?  ? Accurate as of Mar 03, 2022  8:16 PM. If you have any questions, ask your nurse or doctor.  ?  ?  ? ?  ? ?baclofen 10 MG tablet ?Commonly known as: LIORESAL ?Take 1 tablet (10 mg total) by mouth 3 (three) times daily. ?  ?clobetasol cream 0.05 % ?Commonly known as: TEMOVATE ?Apply 1 application topically 2 (two) times daily. ?  ?clopidogrel 75 MG tablet ?Commonly known as: PLAVIX ?TAKE 1 TABLET(75 MG) BY MOUTH DAILY ?  ?glucosamine-chondroitin 500-400 MG tablet ?Take 1 tablet by mouth 3 (three) times daily. ?  ?methocarbamol 500 MG tablet ?Commonly known as: Robaxin ?Take 1 tablet (500 mg total) by mouth 2 (two) times daily as needed. ?  ?metoprolol succinate 50 MG 24 hr tablet ?Commonly known as: TOPROL-XL ?TAKE 1 TABLET(50 MG) BY MOUTH DAILY WITH OR IMMEDIATELY FOLLOWING A MEAL ?  ?ondansetron 4 MG tablet ?Commonly known as: Zofran ?Take 1 tablet (4 mg total) by mouth every 8 (eight) hours as needed for nausea or vomiting. ?  ?pantoprazole 40 MG tablet ?Commonly known as: PROTONIX ?Take 1 tablet (40 mg total) by mouth daily. For stomach ?  ?rosuvastatin 40 MG tablet ?Commonly known as: Crestor ?Take 1 tablet (40 mg total) by mouth daily. For cholesterol ?  ?SUPER B COMPLEX/C PO ?Take by mouth. ?  ?Trelegy Ellipta 100-62.5-25 MCG/ACT Aepb ?Generic drug: Fluticasone-Umeclidin-Vilant ?Inhale 1 puff into the lungs daily. ?  ?Vitamin D 50 MCG (2000 UT) Caps ?Take by mouth. ?  ? ?  ? ? ? ?Follow-up: Return in about 6 months (around 09/03/2022) for COPD, hypertension, cholesterol. ? ?[name], M.D. ?

## 2022-03-04 LAB — CBC WITH DIFFERENTIAL/PLATELET
Basophils Absolute: 0 10*3/uL (ref 0.0–0.2)
Basos: 0 %
EOS (ABSOLUTE): 0.1 10*3/uL (ref 0.0–0.4)
Eos: 1 %
Hematocrit: 44.1 % (ref 37.5–51.0)
Hemoglobin: 15 g/dL (ref 13.0–17.7)
Immature Grans (Abs): 0 10*3/uL (ref 0.0–0.1)
Immature Granulocytes: 0 %
Lymphocytes Absolute: 1.5 10*3/uL (ref 0.7–3.1)
Lymphs: 22 %
MCH: 32.3 pg (ref 26.6–33.0)
MCHC: 34 g/dL (ref 31.5–35.7)
MCV: 95 fL (ref 79–97)
Monocytes Absolute: 0.6 10*3/uL (ref 0.1–0.9)
Monocytes: 8 %
Neutrophils Absolute: 4.7 10*3/uL (ref 1.4–7.0)
Neutrophils: 69 %
Platelets: 189 10*3/uL (ref 150–450)
RBC: 4.65 x10E6/uL (ref 4.14–5.80)
RDW: 13.5 % (ref 11.6–15.4)
WBC: 6.9 10*3/uL (ref 3.4–10.8)

## 2022-03-04 LAB — LIPID PANEL
Chol/HDL Ratio: 2.5 ratio (ref 0.0–5.0)
Cholesterol, Total: 138 mg/dL (ref 100–199)
HDL: 56 mg/dL (ref >39)
LDL Chol Calc (NIH): 58 mg/dL (ref 0–99)
Triglycerides: 139 mg/dL (ref 0–149)
VLDL Cholesterol Cal: 24 mg/dL (ref 5–40)

## 2022-03-04 LAB — CMP14+EGFR
ALT: 29 IU/L (ref 0–44)
AST: 30 IU/L (ref 0–40)
Albumin/Globulin Ratio: 2.1 (ref 1.2–2.2)
Albumin: 4.7 g/dL (ref 3.8–4.8)
Alkaline Phosphatase: 83 IU/L (ref 44–121)
BUN/Creatinine Ratio: 11 (ref 10–24)
BUN: 12 mg/dL (ref 8–27)
Bilirubin Total: 1.2 mg/dL (ref 0.0–1.2)
CO2: 24 mmol/L (ref 20–29)
Calcium: 9.6 mg/dL (ref 8.6–10.2)
Chloride: 101 mmol/L (ref 96–106)
Creatinine, Ser: 1.11 mg/dL (ref 0.76–1.27)
Globulin, Total: 2.2 g/dL (ref 1.5–4.5)
Glucose: 89 mg/dL (ref 70–99)
Potassium: 4.2 mmol/L (ref 3.5–5.2)
Sodium: 139 mmol/L (ref 134–144)
Total Protein: 6.9 g/dL (ref 6.0–8.5)
eGFR: 73 mL/min/{1.73_m2} (ref >59)

## 2022-03-04 NOTE — Progress Notes (Signed)
Hello Micahel,  Your lab result is normal and/or stable.Some minor variations that are not significant are commonly marked abnormal, but do not represent any medical problem for you.  Best regards, Ravinder Lukehart, M.D.

## 2022-03-06 DIAGNOSIS — M75122 Complete rotator cuff tear or rupture of left shoulder, not specified as traumatic: Secondary | ICD-10-CM | POA: Diagnosis not present

## 2022-03-10 DIAGNOSIS — M75122 Complete rotator cuff tear or rupture of left shoulder, not specified as traumatic: Secondary | ICD-10-CM | POA: Diagnosis not present

## 2022-03-13 DIAGNOSIS — M75122 Complete rotator cuff tear or rupture of left shoulder, not specified as traumatic: Secondary | ICD-10-CM | POA: Diagnosis not present

## 2022-03-17 ENCOUNTER — Telehealth: Payer: Self-pay | Admitting: Family Medicine

## 2022-03-17 DIAGNOSIS — M75122 Complete rotator cuff tear or rupture of left shoulder, not specified as traumatic: Secondary | ICD-10-CM | POA: Diagnosis not present

## 2022-03-17 NOTE — Telephone Encounter (Signed)
?  No answer unable to leave a message for patient to call back and schedule Medicare Annual Wellness Visit (AWV) to be completed by video or phone. ? ?No hx of AWV eligible for AWVI per [facility] as of 01/31/2022 ? ?Please schedule at anytime with [place] --- Karle Starch ? ?50 Minutes appointment  ? ?Any questions, please call me at [phone]   ?

## 2022-03-19 ENCOUNTER — Ambulatory Visit: Payer: Medicare HMO | Admitting: Orthopaedic Surgery

## 2022-03-19 ENCOUNTER — Encounter: Payer: Self-pay | Admitting: Orthopaedic Surgery

## 2022-03-19 DIAGNOSIS — M67814 Other specified disorders of tendon, left shoulder: Secondary | ICD-10-CM

## 2022-03-19 DIAGNOSIS — Z9889 Other specified postprocedural states: Secondary | ICD-10-CM

## 2022-03-19 NOTE — Progress Notes (Signed)
   Post-Op Visit Note   Patient: Daniel [name]           Date of Birth: May 29, 1956           MRN: [id number] Visit Date: 03/19/2022 PCP: [name], MD   Assessment & Plan:  Chief Complaint:  Chief Complaint  Patient presents with   Left Shoulder - Follow-up    Left shoulder arthroscopy 12/25/2021   Visit Diagnoses:  1. S/P rotator cuff repair   2. Biceps tendonosis of left shoulder     Plan: Patient is 3 months status post rotator cuff repair and biceps tenodesis.  Currently doing physical therapy at [facility].  He is making excellent progress.  He has no real complaints.  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  Examination of the left shoulder shows fully healed surgical scars.  Range of motion has improved significantly.  PT progress notes were reviewed for his range of motion.  Minimal pain or discomfort.  At this point patient can continue to do PT for another month at twice a week.  He understands he needs to be careful with lifting I would like to limit him to about 10 to 20 pounds for another 6 weeks.  Recheck at that time.  Follow-Up Instructions: Return in about 6 weeks (around 04/30/2022).   Orders:  No orders of the defined types were placed in this encounter.  No orders of the defined types were placed in this encounter.   Imaging: No results found.  PMFS History: Patient Active Problem List   Diagnosis Date Noted   S/P rotator cuff repair 02/05/2022   Biceps tendonosis of left shoulder 02/05/2022   S/P arthroscopy of left shoulder 02/05/2022   Nontraumatic tear of supraspinatus tendon, left 11/12/2021   Tendinopathy of left biceps 11/12/2021   Restless leg syndrome 09/09/2018   Right sided weakness 03/11/2018   Alcohol abuse 03/11/2018   Hypertension 02/07/2018   Cervical stenosis of spine 01/17/2016   BPH (benign prostatic hyperplasia) 07/20/2013   Abnormal chest CT 07/20/2013   Hyperlipidemia 07/20/2013   COPD (chronic  obstructive pulmonary disease) ([place]) 12/08/2011   Smoker 12/08/2011   Multiple pulmonary nodules 12/08/2011   Past Medical History:  Diagnosis Date   Acute ischemic stroke ([place]) 03/12/2018   BPH (benign prostatic hyperplasia)    COPD (chronic obstructive pulmonary disease) (HCC)    Hyperlipidemia    Hypertension    Pulmonary nodule    renal stents    Stroke ([place])    TIA (transient ischemic attack) 03/11/2018    Family History  Problem Relation Age of Onset   Emphysema Father    Cancer Father    Lung cancer Mother     Past Surgical History:  Procedure Laterality Date   CERVICAL FUSION     NO PAST SURGERIES     renal stents     Social History   Occupational History   Occupation: Museum/gallery curator  Tobacco Use   Smoking status: Former    Packs/day: 1.00    Years: 42.00    Pack years: 42.00    Types: Cigarettes   Smokeless tobacco: Never  Substance and Sexual Activity   Alcohol use: Never    Comment: not dirnking now   Drug use: Never   Sexual activity: Not on file

## 2022-03-20 DIAGNOSIS — M75122 Complete rotator cuff tear or rupture of left shoulder, not specified as traumatic: Secondary | ICD-10-CM | POA: Diagnosis not present

## 2022-03-24 DIAGNOSIS — M75122 Complete rotator cuff tear or rupture of left shoulder, not specified as traumatic: Secondary | ICD-10-CM | POA: Diagnosis not present

## 2022-03-27 DIAGNOSIS — M75122 Complete rotator cuff tear or rupture of left shoulder, not specified as traumatic: Secondary | ICD-10-CM | POA: Diagnosis not present

## 2022-03-31 DIAGNOSIS — M75122 Complete rotator cuff tear or rupture of left shoulder, not specified as traumatic: Secondary | ICD-10-CM | POA: Diagnosis not present

## 2022-04-03 DIAGNOSIS — M75122 Complete rotator cuff tear or rupture of left shoulder, not specified as traumatic: Secondary | ICD-10-CM | POA: Diagnosis not present

## 2022-04-10 DIAGNOSIS — M75122 Complete rotator cuff tear or rupture of left shoulder, not specified as traumatic: Secondary | ICD-10-CM | POA: Diagnosis not present

## 2022-04-14 DIAGNOSIS — M75122 Complete rotator cuff tear or rupture of left shoulder, not specified as traumatic: Secondary | ICD-10-CM | POA: Diagnosis not present

## 2022-04-15 ENCOUNTER — Ambulatory Visit: Payer: Medicare HMO | Admitting: Dermatology

## 2022-04-15 DIAGNOSIS — D1801 Hemangioma of skin and subcutaneous tissue: Secondary | ICD-10-CM

## 2022-04-15 DIAGNOSIS — L821 Other seborrheic keratosis: Secondary | ICD-10-CM

## 2022-04-15 DIAGNOSIS — B356 Tinea cruris: Secondary | ICD-10-CM

## 2022-04-15 DIAGNOSIS — L409 Psoriasis, unspecified: Secondary | ICD-10-CM | POA: Diagnosis not present

## 2022-04-15 DIAGNOSIS — L2084 Intrinsic (allergic) eczema: Secondary | ICD-10-CM | POA: Diagnosis not present

## 2022-04-15 DIAGNOSIS — Z1283 Encounter for screening for malignant neoplasm of skin: Secondary | ICD-10-CM

## 2022-04-15 DIAGNOSIS — B353 Tinea pedis: Secondary | ICD-10-CM | POA: Diagnosis not present

## 2022-04-15 MED ORDER — VTAMA 1 % EX CREA: 1.0000 | TOPICAL_CREAM | Freq: Every day | CUTANEOUS | 0 refills | Status: DC

## 2022-04-15 NOTE — Patient Instructions (Signed)
Get Triple paste AF otc in the diaper rash section of the store

## 2022-04-17 DIAGNOSIS — M75122 Complete rotator cuff tear or rupture of left shoulder, not specified as traumatic: Secondary | ICD-10-CM | POA: Diagnosis not present

## 2022-04-21 DIAGNOSIS — M75122 Complete rotator cuff tear or rupture of left shoulder, not specified as traumatic: Secondary | ICD-10-CM | POA: Diagnosis not present

## 2022-04-23 ENCOUNTER — Encounter: Payer: Self-pay | Admitting: Physician Assistant

## 2022-04-23 ENCOUNTER — Ambulatory Visit (INDEPENDENT_AMBULATORY_CARE_PROVIDER_SITE_OTHER): Payer: Medicare HMO | Admitting: Physician Assistant

## 2022-04-23 VITALS — BP 98/69 | HR 61 | Temp 97.1°F | Ht 70.5 in | Wt 199.8 lb

## 2022-04-23 DIAGNOSIS — M545 Low back pain, unspecified: Secondary | ICD-10-CM

## 2022-04-23 MED ORDER — MELOXICAM 15 MG PO TABS: 15.0000 mg | ORAL_TABLET | Freq: Every day | ORAL | 0 refills | Status: DC

## 2022-04-23 NOTE — Patient Instructions (Signed)
Acute Back Pain, Adult Acute back pain is sudden and usually short-lived. It is often caused by an injury to the muscles and tissues in the back. The injury may result from: A muscle, tendon, or ligament getting overstretched or torn. Ligaments are tissues that connect bones to each other. Lifting something improperly can cause a back strain. Wear and tear (degeneration) of the spinal disks. Spinal disks are circular tissue that provide cushioning between the bones of the spine (vertebrae). Twisting motions, such as while playing sports or doing yard work. A hit to the back. Arthritis. You may have a physical exam, lab tests, and imaging tests to find the cause of your pain. Acute back pain usually goes away with rest and home care. Follow these instructions at home: Managing pain, stiffness, and swelling Take over-the-counter and prescription medicines only as told by your health care provider. Treatment may include medicines for pain and inflammation that are taken by mouth or applied to the skin, or muscle relaxants. Your health care provider may recommend applying ice during the first 24-48 hours after your pain starts. To do this: Put ice in a plastic bag. Place a towel between your skin and the bag. Leave the ice on for 20 minutes, 2-3 times a day. Remove the ice if your skin turns bright red. This is very important. If you cannot feel pain, heat, or cold, you have a greater risk of damage to the area. If directed, apply heat to the affected area as often as told by your health care provider. Use the heat source that your health care provider recommends, such as a moist heat pack or a heating pad. Place a towel between your skin and the heat source. Leave the heat on for 20-30 minutes. Remove the heat if your skin turns bright red. This is especially important if you are unable to feel pain, heat, or cold. You have a greater risk of getting burned. Activity  Do not stay in bed. Staying in  bed for more than 1-2 days can delay your recovery. Sit up and stand up straight. Avoid leaning forward when you sit or hunching over when you stand. If you work at a desk, sit close to it so you do not need to lean over. Keep your chin tucked in. Keep your neck drawn back, and keep your elbows bent at a 90-degree angle (right angle). Sit high and close to the steering wheel when you drive. Add lower back (lumbar) support to your car seat, if needed. Take short walks on even surfaces as soon as you are able. Try to increase the length of time you walk each day. Do not sit, drive, or stand in one place for more than 30 minutes at a time. Sitting or standing for long periods of time can put stress on your back. Do not drive or use heavy machinery while taking prescription pain medicine. Use proper lifting techniques. When you bend and lift, use positions that put less stress on your back: Bend your knees. Keep the load close to your body. Avoid twisting. Exercise regularly as told by your health care provider. Exercising helps your back heal faster and helps prevent back injuries by keeping muscles strong and flexible. Work with a physical therapist to make a safe exercise program, as recommended by your health care provider. Do any exercises as told by your physical therapist. Lifestyle Maintain a healthy weight. Extra weight puts stress on your back and makes it difficult to have good   posture. Avoid activities or situations that make you feel anxious or stressed. Stress and anxiety increase muscle tension and can make back pain worse. Learn ways to manage anxiety and stress, such as through exercise. General instructions Sleep on a firm mattress in a comfortable position. Try lying on your side with your knees slightly bent. If you lie on your back, put a pillow under your knees. Keep your head and neck in a straight line with your spine (neutral position) when using electronic equipment like  smartphones or pads. To do this: Raise your smartphone or pad to look at it instead of bending your head or neck to look down. Put the smartphone or pad at the level of your face while looking at the screen. Follow your treatment plan as told by your health care provider. This may include: Cognitive or behavioral therapy. Acupuncture or massage therapy. Meditation or yoga. Contact a health care provider if: You have pain that is not relieved with rest or medicine. You have increasing pain going down into your legs or buttocks. Your pain does not improve after 2 weeks. You have pain at night. You lose weight without trying. You have a fever or chills. You develop nausea or vomiting. You develop abdominal pain. Get help right away if: You develop new bowel or bladder control problems. You have unusual weakness or numbness in your arms or legs. You feel faint. These symptoms may represent a serious problem that is an emergency. Do not wait to see if the symptoms will go away. Get medical help right away. Call your local emergency services (911 in the [place] Do not drive yourself to the hospital. Summary Acute back pain is sudden and usually short-lived. Use proper lifting techniques. When you bend and lift, use positions that put less stress on your back. Take over-the-counter and prescription medicines only as told by your health care provider, and apply heat or ice as told. This information is not intended to replace advice given to you by your health care provider. Make sure you discuss any questions you have with your health care provider. Document Revised: 01/10/2021 Document Reviewed: 01/10/2021 Elsevier Patient Education  2023 Elsevier Inc.  

## 2022-04-24 DIAGNOSIS — M75122 Complete rotator cuff tear or rupture of left shoulder, not specified as traumatic: Secondary | ICD-10-CM | POA: Diagnosis not present

## 2022-04-30 ENCOUNTER — Ambulatory Visit (INDEPENDENT_AMBULATORY_CARE_PROVIDER_SITE_OTHER): Payer: Medicare HMO

## 2022-04-30 ENCOUNTER — Encounter: Payer: Self-pay | Admitting: Orthopaedic Surgery

## 2022-04-30 ENCOUNTER — Ambulatory Visit: Payer: Medicare HMO | Admitting: Orthopaedic Surgery

## 2022-04-30 DIAGNOSIS — Z9889 Other specified postprocedural states: Secondary | ICD-10-CM | POA: Diagnosis not present

## 2022-04-30 DIAGNOSIS — M5441 Lumbago with sciatica, right side: Secondary | ICD-10-CM | POA: Diagnosis not present

## 2022-04-30 MED ORDER — PREDNISONE 10 MG (21) PO TBPK: ORAL_TABLET | ORAL | 3 refills | Status: DC

## 2022-04-30 NOTE — Progress Notes (Signed)
Office Visit Note   Patient: Daniel [name]           Date of Birth: January 19, 1956           MRN: [id number] Visit Date: 04/30/2022              Requested by: [name], MD [place],  [place] [id number] PCP: [name], MD   Assessment & Plan: Visit Diagnoses:  1. Acute right-sided low back pain with right-sided sciatica   2. S/P rotator cuff repair   3. S/P arthroscopy of left shoulder     Plan: Impression is lumbar facet disease.  Meloxicam was not helpful.  I will send in prednisone Dosepak.  In regards to his shoulder he has made an excellent recovery and can follow-up as needed.  Follow-Up Instructions: No follow-ups on file.   Orders:  Orders Placed This Encounter  Procedures   XR Lumbar Spine 2-3 Views   Meds ordered this encounter  Medications   predniSONE (STERAPRED UNI-PAK 21 TAB) 10 MG (21) TBPK tablet    Sig: Take as directed    Dispense:  21 tablet    Refill:  3      Procedures: No procedures performed   Clinical Data: No additional findings.   Subjective: Chief Complaint  Patient presents with   Left Shoulder - Follow-up    Left shoulder scope 12/25/2021   Lower Back - Pain    HPI Daniel [name] is coming in for evaluation of right-sided lower back pain for about a week.  Denies any injuries.  Denies groin pain or radicular symptoms.  Denies any red flag symptoms.  He is status post rotator cuff repair and biceps tenodesis on 12/25/2021.  Doing well in that regard.  Review of Systems  Constitutional: Negative.   All other systems reviewed and are negative.    Objective: Vital Signs: There were no vitals taken for this visit.  Physical Exam Vitals and nursing note reviewed.  Constitutional:      Appearance: He is well-developed.  Pulmonary:     Effort: Pulmonary effort is normal.  Abdominal:     Palpations: Abdomen is soft.  Skin:    General: Skin is warm.  Neurological:     Mental Status: He is alert and  oriented to person, place, and time.  Psychiatric:        Behavior: Behavior normal.        Thought Content: Thought content normal.        Judgment: Judgment normal.     Ortho Exam Examination of the left shoulder shows excellent flexion abduction external rotation.  Mild limitation with with internal rotation.  No significant Popeye deformity.  Strength is appropriate to manual muscle testing.  Examination lumbar spine shows no focal motor or sensory deficits of the lower extremity.  Slight tenderness to the right paraspinous muscles. Specialty Comments:  No specialty comments available.  Imaging: XR Lumbar Spine 2-3 Views  Result Date: 04/30/2022 Lumbar spondylosis and facet disease    PMFS History: Patient Active Problem List   Diagnosis Date Noted   S/P rotator cuff repair 02/05/2022   Biceps tendonosis of left shoulder 02/05/2022   S/P arthroscopy of left shoulder 02/05/2022   Nontraumatic tear of supraspinatus tendon, left 11/12/2021   Tendinopathy of left biceps 11/12/2021   Restless leg syndrome 09/09/2018   Right sided weakness 03/11/2018   Alcohol abuse 03/11/2018   Hypertension 02/07/2018   Cervical stenosis of spine 01/17/2016  BPH (benign prostatic hyperplasia) 07/20/2013   Abnormal chest CT 07/20/2013   Hyperlipidemia 07/20/2013   COPD (chronic obstructive pulmonary disease) ([place]) 12/08/2011   Smoker 12/08/2011   Multiple pulmonary nodules 12/08/2011   Past Medical History:  Diagnosis Date   Acute ischemic stroke ([place]) 03/12/2018   BPH (benign prostatic hyperplasia)    COPD (chronic obstructive pulmonary disease) (HCC)    Hyperlipidemia    Hypertension    Pulmonary nodule    renal stents    Stroke [facility])    TIA (transient ischemic attack) 03/11/2018    Family History  Problem Relation Age of Onset   Emphysema Father    Cancer Father    Lung cancer Mother     Past Surgical History:  Procedure Laterality Date   CERVICAL FUSION     NO PAST  SURGERIES     renal stents     Social History   Occupational History   Occupation: Museum/gallery curator  Tobacco Use   Smoking status: Former    Packs/day: 1.00    Years: 42.00    Total pack years: 42.00    Types: Cigarettes   Smokeless tobacco: Never  Substance and Sexual Activity   Alcohol use: Never    Comment: not dirnking now   Drug use: Never   Sexual activity: Not on file

## 2022-05-03 ENCOUNTER — Other Ambulatory Visit: Payer: Self-pay | Admitting: Dermatology

## 2022-05-03 DIAGNOSIS — L409 Psoriasis, unspecified: Secondary | ICD-10-CM

## 2022-05-06 MED ORDER — VTAMA 1 % EX CREA: 1.0000 | TOPICAL_CREAM | Freq: Every day | CUTANEOUS | 0 refills | Status: DC

## 2022-05-06 NOTE — Telephone Encounter (Signed)
Patient aware not covered by medicare samples when he runs out

## 2022-05-08 ENCOUNTER — Encounter: Payer: Self-pay | Admitting: Dermatology

## 2022-05-08 NOTE — Progress Notes (Signed)
   New Patient   Subjective  Daniel [name] is a 66 y.o. male who presents for the following: New Patient (Initial Visit) (Here for new patient visit. Skin exam. Concerns crusty lesion on hands / arms. Also has toenail fungus on big toe nails. Also groin rash. Treatment Lamisil topical. Using clobetasol on hands for his eczema. ).  General skin examination, several rashes and growths that he would like examined. Location:  Duration:  Quality:  Associated Signs/Symptoms: Modifying Factors:  Severity:  Timing: Context:    The following portions of the chart were reviewed this encounter and updated as appropriate:  Tobacco  Allergies  Meds  Problems  Med Hx  Surg Hx  Fam Hx      Objective  Well appearing patient in no apparent distress; mood and affect are within normal limits. Generalized, Mid Back Full body skin exam.  No atypical pigmented lesions (all checked with dermoscopy), no nonmelanoma skin cancer.  Generalized, Left Forehead Small plaque psoriasiform dermatitis mainly on extremities  Mid Back, Right Upper Back Textured flattopped brown 5 to 6 mm papules  Mid Back Multiple 1 mm smooth red dermal papules  Left Inguinal Area, Right Inguinal Area None marginated dermatitis, has been using topical Lamisil so KOH/culture deferred.  Left Foot - Anterior No lifting, thickening, opacification distal great toenail.  Some moccasin scale.  Fingernails unaffected.  Left Hand - Anterior, Right Hand - Anterior Patches that more resemble chronic eczema than psoriasis.  There is also a minor component of UV damage.    A full examination was performed including scalp, head, eyes, ears, nose, lips, neck, chest, axillae, abdomen, back, buttocks, bilateral upper extremities, bilateral lower extremities, hands, feet, fingers, toes, fingernails, and toenails. All findings within normal limits unless otherwise noted below.   Assessment & Plan  Skin exam for malignant neoplasm  (2) Mid Back; Generalized  Yearly skin exams.  Seborrheic keratosis (2) Right Upper Back; Mid Back  Leave if stable  Hemangioma of skin Mid Back  No intervention necessary  Psoriasis Left Forehead; Generalized  Gave patients samples of [employer] and knows that this will probably wont be covered.  If out-of-pocket cost is unreasonable he will contact us for a substitute.  Related Medications Tapinarof ([employer]) 1 % CREA Apply 1 Application topically daily.  Jock itch (2) Left Inguinal Area; Right Inguinal Area  Tripple paste AF daily after bathing for 3 weeks.  Tinea pedis of right foot Right Foot - Anterior  Tinea pedis of left foot Left Foot - Anterior  Discussed essentially all options for toenail fungus and the difficulty of distinguishing psoriasis with secondary fungus.  No additional intervention recommended.  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

## 2022-06-01 ENCOUNTER — Encounter: Payer: Self-pay | Admitting: Dermatology

## 2022-06-16 ENCOUNTER — Ambulatory Visit: Payer: Medicare HMO | Admitting: Dermatology

## 2022-06-24 ENCOUNTER — Ambulatory Visit (INDEPENDENT_AMBULATORY_CARE_PROVIDER_SITE_OTHER): Payer: Medicare HMO | Admitting: Family Medicine

## 2022-06-24 ENCOUNTER — Encounter: Payer: Self-pay | Admitting: Family Medicine

## 2022-06-24 DIAGNOSIS — J449 Chronic obstructive pulmonary disease, unspecified: Secondary | ICD-10-CM | POA: Diagnosis not present

## 2022-06-24 DIAGNOSIS — U071 COVID-19: Secondary | ICD-10-CM

## 2022-06-24 MED ORDER — NIRMATRELVIR/RITONAVIR (PAXLOVID)TABLET: 3.0000 | ORAL_TABLET | Freq: Two times a day (BID) | ORAL | 0 refills | Status: AC

## 2022-06-24 MED ORDER — AZITHROMYCIN 250 MG PO TABS: ORAL_TABLET | ORAL | 0 refills | Status: DC

## 2022-06-24 MED ORDER — DEXAMETHASONE 4 MG PO TABS: 4.0000 mg | ORAL_TABLET | Freq: Every day | ORAL | 0 refills | Status: AC

## 2022-06-24 NOTE — Progress Notes (Signed)
   Virtual Visit  Note Due to COVID-19 pandemic this visit was conducted virtually. This visit type was conducted due to national recommendations for restrictions regarding the COVID-19 Pandemic (e.g. social distancing, sheltering in place) in an effort to limit this patient's exposure and mitigate transmission in our community. All issues noted in this document were discussed and addressed.  A physical exam was not performed with this format.  I connected with Daniel [name] on 06/24/22 at 0903 by telephone and verified that I am speaking with the correct person using two identifiers. Daniel [name] is currently located at work and his wife is currently with him during the visit. The provider, Gwenlyn Perking, FNP is located in their office at time of visit.  I discussed the limitations, risks, security and privacy concerns of performing an evaluation and management service by telephone and the availability of in person appointments. I also discussed with the patient that there may be a patient responsible charge related to this service. The patient expressed understanding and agreed to proceed.  CC: Covid positive  History and Present Illness:  HPI Daniel [name] reports a positive home Covid test this morning. He reports a sore throat, congesiton, and dry cough. Denies chest pain, edema, fever, or chills. Shorntess of breath is at baseline given his COPD. He has been vaccinated and has had a booster within the last month. He does have a history of COPD exacerbations.    ROS As per HPI.   Observations/Objective: Discussed symptomatic care and return precautions.   Assessment and Plan: Daniel [name] was seen today for covid positive.  Diagnoses and all orders for this visit:  COVID-19 Paxlovid as below. Discussed to hold statin while taking paxlovid. No other significant interactions with medications. Zpak and dexamethasone sent in with comorbidity of COPD. Go to ER for increased shortness of  breath or chest pain.  -     nirmatrelvir/ritonavir EUA (PAXLOVID) 20 x 150 MG & 10 x '100MG'$  TABS; Take 3 tablets by mouth 2 (two) times daily for 5 days. (Take nirmatrelvir 150 mg two tablets twice daily for 5 days and ritonavir 100 mg one tablet twice daily for 5 days) Patient GFR is >60  Chronic obstructive pulmonary disease, unspecified COPD type (HCC) -     azithromycin (ZITHROMAX Z-PAK) 250 MG tablet; As directed -     dexamethasone (DECADRON) 4 MG tablet; Take 1 tablet (4 mg total) by mouth daily for 5 days.   Follow Up Instructions: As needed.     I discussed the assessment and treatment plan with the patient. The patient was provided an opportunity to ask questions and all were answered. The patient agreed with the plan and demonstrated an understanding of the instructions.   The patient was advised to call back or seek an in-person evaluation if the symptoms worsen or if the condition fails to improve as anticipated.  The above assessment and management plan was discussed with the patient. The patient verbalized understanding of and has agreed to the management plan. Patient is aware to call the clinic if symptoms persist or worsen. Patient is aware when to return to the clinic for a follow-up visit. Patient educated on when it is appropriate to go to the emergency department.   Time call ended:  0912  I provided 11 minutes of  non face-to-face time during this encounter.    Gwenlyn Perking, FNP

## 2022-07-17 ENCOUNTER — Other Ambulatory Visit: Payer: Self-pay | Admitting: Family Medicine

## 2022-07-28 DIAGNOSIS — J449 Chronic obstructive pulmonary disease, unspecified: Secondary | ICD-10-CM | POA: Diagnosis not present

## 2022-07-28 DIAGNOSIS — S39012A Strain of muscle, fascia and tendon of lower back, initial encounter: Secondary | ICD-10-CM | POA: Diagnosis not present

## 2022-07-28 DIAGNOSIS — Z79899 Other long term (current) drug therapy: Secondary | ICD-10-CM | POA: Diagnosis not present

## 2022-07-28 DIAGNOSIS — Z87891 Personal history of nicotine dependence: Secondary | ICD-10-CM | POA: Diagnosis not present

## 2022-07-28 DIAGNOSIS — Z8673 Personal history of transient ischemic attack (TIA), and cerebral infarction without residual deficits: Secondary | ICD-10-CM | POA: Diagnosis not present

## 2022-07-28 DIAGNOSIS — I10 Essential (primary) hypertension: Secondary | ICD-10-CM | POA: Diagnosis not present

## 2022-07-28 DIAGNOSIS — M47816 Spondylosis without myelopathy or radiculopathy, lumbar region: Secondary | ICD-10-CM | POA: Diagnosis not present

## 2022-07-28 DIAGNOSIS — M5441 Lumbago with sciatica, right side: Secondary | ICD-10-CM | POA: Diagnosis not present

## 2022-07-28 DIAGNOSIS — E785 Hyperlipidemia, unspecified: Secondary | ICD-10-CM | POA: Diagnosis not present

## 2022-07-28 DIAGNOSIS — M1990 Unspecified osteoarthritis, unspecified site: Secondary | ICD-10-CM | POA: Diagnosis not present

## 2022-07-28 DIAGNOSIS — M5431 Sciatica, right side: Secondary | ICD-10-CM | POA: Diagnosis not present

## 2022-07-30 ENCOUNTER — Ambulatory Visit: Payer: Medicare HMO | Admitting: Family Medicine

## 2022-07-30 ENCOUNTER — Ambulatory Visit (INDEPENDENT_AMBULATORY_CARE_PROVIDER_SITE_OTHER): Payer: Medicare HMO | Admitting: Family Medicine

## 2022-07-30 ENCOUNTER — Encounter: Payer: Self-pay | Admitting: Family Medicine

## 2022-07-30 VITALS — BP 114/71 | HR 66 | Temp 97.9°F | Ht 70.5 in | Wt 189.0 lb

## 2022-07-30 DIAGNOSIS — M5440 Lumbago with sciatica, unspecified side: Secondary | ICD-10-CM | POA: Diagnosis not present

## 2022-07-30 MED ORDER — METHYLPREDNISOLONE ACETATE 80 MG/ML IJ SUSP
80.0000 mg | Freq: Once | INTRAMUSCULAR | Status: AC
  Administered 2022-07-30: 80 mg via INTRA_ARTICULAR

## 2022-07-30 NOTE — Progress Notes (Signed)
BP 114/71   Pulse 66   Temp 97.9 F (36.6 C)   Ht 5' 10.5" (1.791 m)   Wt 189 lb (85.7 kg)   SpO2 98%   BMI 26.74 kg/m    Subjective:   Patient ID: Daniel [name], male    DOB: [birth year], 66 y.o.   MRN: [id number]  HPI: Daniel [name] is a 66 y.o. male presenting on 07/30/2022 for Back Pain (Radiates down leg)   HPI Lower back pain. Patient is coming in with lower back pain.  He has been having this off and on for at least the past 2 or 3 months and its been bothering him some but he has been able to manage it.  He does have some hydrocodone and some muscle relaxers that he has been using to manage along with Tylenol but then over the past 3 days it has been worse.  He was very active 3 days ago and doing a lot of outside work and physical work and it started hurting that day after and then it was worse the next day to where he was having trouble even getting up.  He says the pain is not in his left lower back and goes all the way down the back of his left leg.  He did go to the ED in [place] couple days ago and had an x-ray that came back normal.  Relevant past medical, surgical, family and social history reviewed and updated as indicated. Interim medical history since our last visit reviewed. Allergies and medications reviewed and updated.  Review of Systems  Constitutional:  Negative for chills and fever.  Respiratory:  Negative for shortness of breath and wheezing.   Cardiovascular:  Negative for chest pain and leg swelling.  Musculoskeletal:  Positive for arthralgias and back pain. Negative for gait problem and myalgias.  Skin:  Negative for rash.  Neurological:  Negative for weakness and numbness.  All other systems reviewed and are negative.   Per HPI unless specifically indicated above   Allergies as of 07/30/2022   No Known Allergies      Medication List        Accurate as of July 30, 2022 10:43 AM. If you have any questions, ask your nurse  or doctor.          STOP taking these medications    azithromycin 250 MG tablet Commonly known as: Zithromax Z-Pak Stopped by: Fransisca Kaufmann Ameliarose Shark, MD   clobetasol cream 0.05 % Commonly known as: TEMOVATE Stopped by: Worthy Rancher, MD   meloxicam 15 MG tablet Commonly known as: MOBIC Stopped by: Fransisca Kaufmann Tomasina Keasling, MD   ondansetron 4 MG tablet Commonly known as: Zofran Stopped by: Worthy Rancher, MD   Vtama 1 % Crea Generic drug: Tapinarof Stopped by: Fransisca Kaufmann Matti Minney, MD       TAKE these medications    baclofen 10 MG tablet Commonly known as: LIORESAL Take 1 tablet (10 mg total) by mouth 3 (three) times daily.   clopidogrel 75 MG tablet Commonly known as: PLAVIX TAKE 1 TABLET(75 MG) BY MOUTH DAILY   glucosamine-chondroitin 500-400 MG tablet Take 1 tablet by mouth 3 (three) times daily.   methocarbamol 500 MG tablet Commonly known as: Robaxin Take 1 tablet (500 mg total) by mouth 2 (two) times daily as needed.   metoprolol succinate 50 MG 24 hr tablet Commonly known as: TOPROL-XL TAKE 1 TABLET(50 MG) BY MOUTH DAILY WITH OR IMMEDIATELY FOLLOWING A MEAL  pantoprazole 40 MG tablet Commonly known as: PROTONIX Take 1 tablet (40 mg total) by mouth daily. For stomach   rosuvastatin 40 MG tablet Commonly known as: CRESTOR TAKE 1 TABLET(40 MG) BY MOUTH DAILY FOR CHOLESTEROL   SUPER B COMPLEX/C PO Take by mouth.   Trelegy Ellipta 100-62.5-25 MCG/ACT Aepb Generic drug: Fluticasone-Umeclidin-Vilant Inhale 1 puff into the lungs daily.   Vitamin D 50 MCG (2000 UT) Caps Take by mouth.         Objective:   BP 114/71   Pulse 66   Temp 97.9 F (36.6 C)   Ht 5' 10.5" (1.791 m)   Wt 189 lb (85.7 kg)   SpO2 98%   BMI 26.74 kg/m   Wt Readings from Last 3 Encounters:  07/30/22 189 lb (85.7 kg)  04/23/22 199 lb 12.8 oz (90.6 kg)  03/03/22 210 lb (95.3 kg)    Physical Exam Vitals and nursing note reviewed.  Constitutional:      General:  He is not in acute distress.    Appearance: He is well-developed. He is not diaphoretic.  Eyes:     General: No scleral icterus.    Conjunctiva/sclera: Conjunctivae normal.  Neck:     Thyroid: No thyromegaly.  Musculoskeletal:        General: Normal range of motion.     Cervical back: Neck supple.     Lumbar back: No tenderness or bony tenderness. Normal range of motion. Negative right straight leg raise test and negative left straight leg raise test.     Comments: Pain with extension and rotation of the back to the right, no pain on palpation  Lymphadenopathy:     Cervical: No cervical adenopathy.  Skin:    General: Skin is warm and dry.     Findings: No rash.  Neurological:     Mental Status: He is alert and oriented to person, place, and time.     Coordination: Coordination normal.  Psychiatric:        Behavior: Behavior normal.       Assessment & Plan:   Problem List Items Addressed This Visit   None Visit Diagnoses     Back pain of lumbar region with sciatica    -  Primary   Relevant Medications   methylPREDNISolone acetate (DEPO-MEDROL) injection 80 mg (Start on 07/30/2022 10:45 AM)       Patient has most relaxers, continue with diet, will do Depo 80 today see if we can calm this down, if not he may need to go back to see his orthopedic in consider MRI  He is to continue with muscle relaxers and give a list of stretches that he can do. Follow up plan: Return if symptoms worsen or fail to improve.  Counseling provided for all of the vaccine components No orders of the defined types were placed in this encounter.   Caryl Pina, MD [facility] 07/30/2022, 10:43 AM

## 2022-08-02 ENCOUNTER — Encounter: Payer: Self-pay | Admitting: Family Medicine

## 2022-08-03 NOTE — Telephone Encounter (Signed)
Pt saw Dr Warrick Parisian about this. Can Dettingers covering provider Lenna Gilford) order MRI since Dettinger is out sick?  Please advise and call wife.  [phone]

## 2022-08-03 NOTE — Telephone Encounter (Signed)
Have him come to the office tomorrow at 9:25

## 2022-08-04 ENCOUNTER — Encounter: Payer: Self-pay | Admitting: Family Medicine

## 2022-08-04 ENCOUNTER — Ambulatory Visit (INDEPENDENT_AMBULATORY_CARE_PROVIDER_SITE_OTHER): Payer: Medicare HMO | Admitting: Family Medicine

## 2022-08-04 VITALS — BP 102/62 | HR 60 | Temp 98.0°F | Ht 70.5 in | Wt 184.2 lb

## 2022-08-04 DIAGNOSIS — M5416 Radiculopathy, lumbar region: Secondary | ICD-10-CM | POA: Diagnosis not present

## 2022-08-04 MED ORDER — METHOCARBAMOL 500 MG PO TABS: 500.0000 mg | ORAL_TABLET | Freq: Four times a day (QID) | ORAL | 2 refills | Status: DC | PRN

## 2022-08-04 MED ORDER — BETAMETHASONE SOD PHOS & ACET 6 (3-3) MG/ML IJ SUSP
6.0000 mg | Freq: Once | INTRAMUSCULAR | Status: AC
  Administered 2022-08-04: 6 mg via INTRAMUSCULAR

## 2022-08-04 MED ORDER — PREDNISONE 10 MG PO TABS: ORAL_TABLET | ORAL | 0 refills | Status: DC

## 2022-08-04 NOTE — Progress Notes (Signed)
Subjective:  Patient ID: Daniel [name], male    DOB: 02-06-1956  Age: 66 y.o. MRN: [id number]  CC: Back Pain and Referral   HPI Daniel [name] presents for right sided lumbar pain. Onset 3 mos ago. Was improving until he had a lot of wood splitting to do last week. Had an injection last week that gave minimal relief. He tried to mow the next day. Pain is at 5/10 at rest. Getting up to bathroom flares pain to 8/10.      08/04/2022    9:31 AM 07/30/2022   10:27 AM 04/23/2022   10:00 AM  Depression screen PHQ 2/9  Decreased Interest 0 0 0  Down, Depressed, Hopeless 0 0 0  PHQ - 2 Score 0 0 0  Altered sleeping  0   Tired, decreased energy  0   Change in appetite  0   Feeling bad or failure about yourself   0   Trouble concentrating  0   Moving slowly or fidgety/restless  0   Suicidal thoughts  0   PHQ-9 Score  0   Difficult doing work/chores  Not difficult at all     History Daniel [name] has a past medical history of Acute ischemic stroke ([place]) (03/12/2018), BPH (benign prostatic hyperplasia), COPD (chronic obstructive pulmonary disease) ([place]), Hyperlipidemia, Hypertension, Pulmonary nodule, renal stents, Stroke ([place]), and TIA (transient ischemic attack) (03/11/2018).   He has a past surgical history that includes No past surgeries; renal stents; and Cervical fusion.   His family history includes Cancer in his father; Emphysema in his father; Lung cancer in his mother.He reports that he has quit smoking. His smoking use included cigarettes. He has a 42.00 pack-year smoking history. He has never used smokeless tobacco. He reports that he does not drink alcohol and does not use drugs.    ROS Review of Systems  Constitutional:  Negative for fever.  Respiratory:  Negative for shortness of breath.   Cardiovascular:  Negative for chest pain.  Musculoskeletal:  Positive for arthralgias, back pain and myalgias.  Skin:  Negative for rash.  Neurological:  Positive for numbness (right  medial leg).    Objective:  BP 102/62   Pulse 60   Temp 98 F (36.7 C)   Ht 5' 10.5" (1.791 m)   Wt 184 lb 3.2 oz (83.6 kg)   SpO2 98%   BMI 26.06 kg/m   BP Readings from Last 3 Encounters:  08/04/22 102/62  07/30/22 114/71  04/23/22 98/69    Wt Readings from Last 3 Encounters:  08/04/22 184 lb 3.2 oz (83.6 kg)  07/30/22 189 lb (85.7 kg)  04/23/22 199 lb 12.8 oz (90.6 kg)     Physical Exam Vitals reviewed.  Constitutional:      Appearance: He is well-developed.  HENT:     Head: Normocephalic and atraumatic.     Right Ear: External ear normal.     Left Ear: External ear normal.     Mouth/Throat:     Pharynx: No oropharyngeal exudate or posterior oropharyngeal erythema.  Eyes:     Pupils: Pupils are equal, round, and reactive to light.  Cardiovascular:     Rate and Rhythm: Normal rate and regular rhythm.     Heart sounds: No murmur heard. Pulmonary:     Effort: No respiratory distress.     Breath sounds: Normal breath sounds.  Musculoskeletal:     Cervical back: Normal range of motion and neck supple.     Comments: Palpable spasm  at right lumbar 4-5 area. SLR negative  Neurological:     Mental Status: He is alert and oriented to person, place, and time.       Assessment & Plan:   Daniel [name] was seen today for back pain and referral.  Diagnoses and all orders for this visit:  Lumbar radiculopathy, acute -     betamethasone acetate-betamethasone sodium phosphate (CELESTONE) injection 6 mg -     MR LUMBAR SPINE WO CONTRAST; Future -     Ambulatory referral to Physical Therapy -     Ambulatory referral to Orthopedics  Other orders -     methocarbamol (ROBAXIN) 500 MG tablet; Take 1 tablet (500 mg total) by mouth every 6 (six) hours as needed. -     predniSONE (DELTASONE) 10 MG tablet; Take 5 daily for 3 days followed by 4,3,2 and 1 for 3 days each.       I have changed Daniel Rhine "[name]"'s methocarbamol. I am also having him start on  predniSONE. Additionally, I am having him maintain his SUPER B COMPLEX/C PO, glucosamine-chondroitin, Vitamin D, baclofen, metoprolol succinate, pantoprazole, clopidogrel, Trelegy Ellipta, and rosuvastatin. We will continue to administer betamethasone acetate-betamethasone sodium phosphate.  Allergies as of 08/04/2022   No Known Allergies      Medication List        Accurate as of August 04, 2022 10:18 AM. If you have any questions, ask your nurse or doctor.          baclofen 10 MG tablet Commonly known as: LIORESAL Take 1 tablet (10 mg total) by mouth 3 (three) times daily.   clopidogrel 75 MG tablet Commonly known as: PLAVIX TAKE 1 TABLET(75 MG) BY MOUTH DAILY   glucosamine-chondroitin 500-400 MG tablet Take 1 tablet by mouth 3 (three) times daily.   methocarbamol 500 MG tablet Commonly known as: Robaxin Take 1 tablet (500 mg total) by mouth every 6 (six) hours as needed. What changed: when to take this Changed by: [name], MD   metoprolol succinate 50 MG 24 hr tablet Commonly known as: TOPROL-XL TAKE 1 TABLET(50 MG) BY MOUTH DAILY WITH OR IMMEDIATELY FOLLOWING A MEAL   pantoprazole 40 MG tablet Commonly known as: PROTONIX Take 1 tablet (40 mg total) by mouth daily. For stomach   predniSONE 10 MG tablet Commonly known as: DELTASONE Take 5 daily for 3 days followed by 4,3,2 and 1 for 3 days each. Started by: [name], MD   rosuvastatin 40 MG tablet Commonly known as: CRESTOR TAKE 1 TABLET(40 MG) BY MOUTH DAILY FOR CHOLESTEROL   SUPER B COMPLEX/C PO Take by mouth.   Trelegy Ellipta 100-62.5-25 MCG/ACT Aepb Generic drug: Fluticasone-Umeclidin-Vilant Inhale 1 puff into the lungs daily.   Vitamin D 50 MCG (2000 UT) Caps Take by mouth.         Follow-up: Return in about 2 weeks (around 08/18/2022).  [name], M.D.

## 2022-08-09 ENCOUNTER — Ambulatory Visit (HOSPITAL_COMMUNITY)
Admission: RE | Admit: 2022-08-09 | Discharge: 2022-08-09 | Disposition: A | Discharge status: Home / Self Care | Payer: Medicare HMO | Source: Ambulatory Visit | Attending: Family Medicine | Admitting: Family Medicine

## 2022-08-09 DIAGNOSIS — M5416 Radiculopathy, lumbar region: Secondary | ICD-10-CM | POA: Insufficient documentation

## 2022-08-09 DIAGNOSIS — M5126 Other intervertebral disc displacement, lumbar region: Secondary | ICD-10-CM | POA: Diagnosis not present

## 2022-08-09 DIAGNOSIS — M545 Low back pain, unspecified: Secondary | ICD-10-CM | POA: Diagnosis not present

## 2022-08-11 ENCOUNTER — Encounter: Payer: Self-pay | Admitting: Family Medicine

## 2022-08-11 DIAGNOSIS — I700 Atherosclerosis of aorta: Secondary | ICD-10-CM | POA: Insufficient documentation

## 2022-08-17 DIAGNOSIS — M5416 Radiculopathy, lumbar region: Secondary | ICD-10-CM | POA: Diagnosis not present

## 2022-08-18 ENCOUNTER — Encounter: Payer: Self-pay | Admitting: Family Medicine

## 2022-08-18 ENCOUNTER — Other Ambulatory Visit: Payer: Self-pay | Admitting: Orthopedic Surgery

## 2022-08-19 ENCOUNTER — Ambulatory Visit: Payer: Medicare HMO | Admitting: Family Medicine

## 2022-08-25 NOTE — Telephone Encounter (Signed)
Medical clearance form and note form NP faxed to [facility], received confirmation.

## 2022-08-25 NOTE — Telephone Encounter (Signed)
New surgical clearance received for Lumbar fusion and decompression. Placed on NP desk for review and signature.

## 2022-08-25 NOTE — Telephone Encounter (Signed)
Please provide this information with clearance form  As long as patient has been stable from stroke standpoint without any recent stroke/TIA symptoms, he is cleared to proceed with procedure currently scheduled on 11/8. He can stop Plavix 3 to 5 days prior to procedure with small but acceptable risk of recurrent stroke while off therapy, recommend restarting immediately after or once hemodynamically stable.   Clearance form signed and placed in outbox.  Thank you.

## 2022-08-28 NOTE — Pre-Procedure Instructions (Signed)
Surgical Instructions    Your procedure is scheduled on Wednesday, November 8th.  Report to [facility] Main Entrance "A" at 6:30 A.M., then check in with the Admitting office.  Call this number if you have problems the morning of surgery:  [phone]   If you have any questions prior to your surgery date call [phone]: Open Monday-Friday 8am-4pm    Remember:  Do not eat after midnight the night before your surgery  You may drink clear liquids until 5:30 a.m. the morning of your surgery.   Clear liquids allowed are: Water, Non-Citrus Juices (without pulp), Carbonated Beverages, Clear Tea, Black Coffee Only (NO MILK, CREAM OR POWDERED CREAMER of any kind), and Gatorade.   Enhanced Recovery after Surgery for Orthopedics Enhanced Recovery after Surgery is a protocol used to improve the stress on your body and your recovery after surgery.  Patient Instructions  The day of surgery (if you do NOT have diabetes):  Drink ONE (1) Pre-Surgery Clear Ensure by 5:30 am the morning of surgery   This drink was given to you during your hospital  pre-op appointment visit. Nothing else to drink after completing the  Pre-Surgery Clear Ensure.         If you have questions, please contact your surgeon's office.     Take these medicines the morning of surgery with A SIP OF WATER  baclofen (LIORESAL)  Fluticasone-Umeclidin-Vilant (TRELEGY ELLIPTA)  metoprolol succinate (TOPROL-XL) Oxycodone HCl  pantoprazole (PROTONIX)  rosuvastatin (CRESTOR)   Take these medications AS NEEDED Albuterol Inhaler-Please bring all inhalers with you the day of surgery.  HYDROcodone-acetaminophen (NORCO/VICODIN)  methocarbamol (ROBAXIN)  Follow your surgeon's instructions on when to stop clopidogrel (PLAVIX).  If no instructions were given by your surgeon then you will need to call the office to get those instructions.    As of today, STOP taking any Aspirin (unless otherwise instructed by your surgeon)  Aleve, Naproxen, Ibuprofen, Motrin, Advil, Goody's, BC's, all herbal medications, fish oil, and all vitamins. This includes: diclofenac Sodium (VOLTAREN) 1 % GEL.                    Do NOT Smoke (Tobacco/Vaping) for 24 hours prior to your procedure.  If you use a CPAP at night, you may bring your mask/headgear for your overnight stay.   Contacts, glasses, piercing's, hearing aid's, dentures or partials may not be worn into surgery, please bring cases for these belongings.    For patients admitted to the hospital, discharge time will be determined by your treatment team.   Patients discharged the day of surgery will not be allowed to drive home, and someone needs to stay with them for 24 hours.  SURGICAL WAITING ROOM VISITATION Patients having surgery or a procedure may have no more than 2 support people in the waiting area - these visitors may rotate.   Children under the age of 74 must have an adult with them who is not the patient. If the patient needs to stay at the hospital during part of their recovery, the visitor guidelines for inpatient rooms apply. Pre-op nurse will coordinate an appropriate time for 1 support person to accompany patient in pre-op.  This support person may not rotate.   Please refer to the [facility] website for the visitor guidelines for Inpatients (after your surgery is over and you are in a regular room).    Special instructions:   [facility]- Preparing For Surgery  Before surgery, you can play an important role.  Because skin is not sterile, your skin needs to be as free of germs as possible. You can reduce the number of germs on your skin by washing with CHG (chlorahexidine gluconate) Soap before surgery.  CHG is an antiseptic cleaner which kills germs and bonds with the skin to continue killing germs even after washing.    Oral Hygiene is also important to reduce your risk of infection.  Remember - BRUSH YOUR TEETH THE MORNING OF SURGERY WITH YOUR REGULAR  TOOTHPASTE  Please do not use if you have an allergy to CHG or antibacterial soaps. If your skin becomes reddened/irritated stop using the CHG.  Do not shave (including legs and underarms) for at least 48 hours prior to first CHG shower. It is OK to shave your face.  Please follow these instructions carefully.   Shower the NIGHT BEFORE SURGERY and the MORNING OF SURGERY  If you chose to wash your hair, wash your hair first as usual with your normal shampoo.  After you shampoo, rinse your hair and body thoroughly to remove the shampoo.  Use CHG Soap as you would any other liquid soap. You can apply CHG directly to the skin and wash gently with a scrungie or a clean washcloth.   Apply the CHG Soap to your body ONLY FROM THE NECK DOWN.  Do not use on open wounds or open sores. Avoid contact with your eyes, ears, mouth and genitals (private parts). Wash Face and genitals (private parts)  with your normal soap.   Wash thoroughly, paying special attention to the area where your surgery will be performed.  Thoroughly rinse your body with warm water from the neck down.  DO NOT shower/wash with your normal soap after using and rinsing off the CHG Soap.  Pat yourself dry with a CLEAN TOWEL.  Wear CLEAN PAJAMAS to bed the night before surgery  Place CLEAN SHEETS on your bed the night before your surgery  DO NOT SLEEP WITH PETS.   Day of Surgery: Take a shower with CHG soap. Do not wear jewelry. Do not wear lotions, powders, colognes, or deodorant. Men may shave face and neck. Do not bring valuables to the hospital.  [facility] is not responsible for any belongings or valuables. Wear Clean/Comfortable clothing the morning of surgery Remember to brush your teeth WITH YOUR REGULAR TOOTHPASTE.   Please read over the following fact sheets that you were given.    If you received a COVID test during your pre-op visit  it is requested that you wear a mask when out in public, stay away  from anyone that may not be feeling well and notify your surgeon if you develop symptoms. If you have been in contact with anyone that has tested positive in the last 10 days please notify you surgeon.

## 2022-08-29 ENCOUNTER — Other Ambulatory Visit: Payer: Self-pay | Admitting: Family Medicine

## 2022-08-29 DIAGNOSIS — U071 COVID-19: Secondary | ICD-10-CM

## 2022-08-31 ENCOUNTER — Other Ambulatory Visit: Payer: Self-pay

## 2022-08-31 ENCOUNTER — Encounter (HOSPITAL_COMMUNITY): Payer: Self-pay

## 2022-08-31 ENCOUNTER — Encounter (HOSPITAL_COMMUNITY)
Admission: RE | Admit: 2022-08-31 | Discharge: 2022-08-31 | Disposition: A | Discharge status: Home / Self Care | Payer: Medicare HMO | Source: Ambulatory Visit | Attending: Orthopedic Surgery | Admitting: Orthopedic Surgery

## 2022-08-31 VITALS — BP 121/75 | HR 63 | Temp 97.4°F | Resp 17 | Ht 70.5 in | Wt 189.6 lb

## 2022-08-31 DIAGNOSIS — Z01818 Encounter for other preprocedural examination: Secondary | ICD-10-CM

## 2022-08-31 DIAGNOSIS — I2510 Atherosclerotic heart disease of native coronary artery without angina pectoris: Secondary | ICD-10-CM

## 2022-08-31 DIAGNOSIS — Z01812 Encounter for preprocedural laboratory examination: Secondary | ICD-10-CM | POA: Insufficient documentation

## 2022-08-31 HISTORY — DX: Other complications of anesthesia, initial encounter: T88.59XA

## 2022-08-31 LAB — COMPREHENSIVE METABOLIC PANEL
ALT: 43 U/L (ref 0–44)
AST: 41 U/L (ref 15–41)
Albumin: 4.3 g/dL (ref 3.5–5.0)
Alkaline Phosphatase: 62 U/L (ref 38–126)
Anion gap: 11 (ref 5–15)
BUN: 13 mg/dL (ref 8–23)
CO2: 25 mmol/L (ref 22–32)
Calcium: 9.4 mg/dL (ref 8.9–10.3)
Chloride: 101 mmol/L (ref 98–111)
Creatinine, Ser: 1.16 mg/dL (ref 0.61–1.24)
GFR, Estimated: >60 mL/min (ref >60)
Glucose, Bld: 111 mg/dL — ABNORMAL HIGH (ref 70–99)
Potassium: 4 mmol/L (ref 3.5–5.1)
Sodium: 137 mmol/L (ref 135–145)
Total Bilirubin: 1.9 mg/dL — ABNORMAL HIGH (ref 0.3–1.2)
Total Protein: 7 g/dL (ref 6.5–8.1)

## 2022-08-31 LAB — CBC
HCT: 48 % (ref 39.0–52.0)
Hemoglobin: 16.5 g/dL (ref 13.0–17.0)
MCH: 33.7 pg (ref 26.0–34.0)
MCHC: 34.4 g/dL (ref 30.0–36.0)
MCV: 98.2 fL (ref 80.0–100.0)
Platelets: 160 10*3/uL (ref 150–400)
RBC: 4.89 MIL/uL (ref 4.22–5.81)
RDW: 13.3 % (ref 11.5–15.5)
WBC: 5.5 10*3/uL (ref 4.0–10.5)
nRBC: 0 % (ref 0.0–0.2)

## 2022-08-31 LAB — TYPE AND SCREEN
ABO/RH(D): O POS
Antibody Screen: NEGATIVE

## 2022-08-31 LAB — SURGICAL PCR SCREEN
MRSA, PCR: NEGATIVE
Staphylococcus aureus: POSITIVE — AB

## 2022-08-31 NOTE — Progress Notes (Signed)
PCP - Dr. [name] Cardiologist - denies Vascular-Dr. Christell Faith  PPM/ICD - n/a  Chest x-ray - 01/02/22 EKG - 12/09/21 Stress Test - denies ECHO - denies Cardiac Cath - denies  Sleep Study - denies CPAP - denies  Last dose of GLP1 agonist-  n/a GLP1 instructions: n/a  Blood Thinner Instructions: Per surgeon's office, hold Plavix 3-5 days pre-op. Pt will hold 5 days, last dose 09/03/22.  Aspirin Instructions: n/a  ERAS Protcol -clear liquids until 0530 DOS.  PRE-SURGERY Ensure or G2- (1) ensure given.  COVID TEST- No, pt tested positive on 06/24/22. Note from PCP in epic on 06/24/22.   Anesthesia review: Yes, hx of stroke, COPD and pulm nodule.   Patient denies shortness of breath, fever, cough and chest pain at PAT appointment   All instructions explained to the patient, with a verbal understanding of the material. Patient agrees to go over the instructions while at home for a better understanding. Patient also instructed to self quarantine after being tested for COVID-19. The opportunity to ask questions was provided.

## 2022-09-01 NOTE — Anesthesia Preprocedure Evaluation (Addendum)
Anesthesia Evaluation  Patient identified by MRN, date of birth, ID band Patient awake    Reviewed: Allergy & Precautions, H&P , NPO status , Patient's Chart, lab work & pertinent test results, reviewed documented beta blocker date and time   Airway Mallampati: III  TM Distance: >3 FB Neck ROM: Full    Dental  (+) Poor Dentition, Missing, Chipped, Dental Advisory Given   Pulmonary COPD,  COPD inhaler, former smoker 42 pack year history    Pulmonary exam normal breath sounds clear to auscultation       Cardiovascular hypertension, Pt. on medications and Pt. on home beta blockers  Rhythm:Regular Rate:Normal  Echo 2019 LVEF 60-65%, normal wall thickness, normal wall motion, normal    diastolic function, trivial MR, moderate biatrial enlargement,    trivial TR, RVSP 40 mmHg, dilated IVC.     Neuro/Psych CVA (2019, plavix LD:), No Residual Symptoms  negative psych ROS   GI/Hepatic negative GI ROS,,,(+)       alcohol use  Endo/Other  negative endocrine ROS    Renal/GU Renal InsufficiencyRenal diseaseCr 1.16  negative genitourinary   Musculoskeletal negative musculoskeletal ROS (+)    Abdominal   Peds negative pediatric ROS (+)  Hematology negative hematology ROS (+) Hb 16.5   Anesthesia Other Findings   Reproductive/Obstetrics negative OB ROS                             Anesthesia Physical Anesthesia Plan  ASA: 3  Anesthesia Plan: General   Post-op Pain Management: Tylenol PO (pre-op)* and Ketamine IV*   Induction: Intravenous  PONV Risk Score and Plan: 2 and Ondansetron, Dexamethasone, Midazolam and Treatment may vary due to age or medical condition  Airway Management Planned: Oral ETT and Video Laryngoscope Planned  Additional Equipment: None  Intra-op Plan:   Post-operative Plan: Extubation in OR  Informed Consent: I have reviewed the patients History and Physical, chart,  labs and discussed the procedure including the risks, benefits and alternatives for the proposed anesthesia with the patient or authorized representative who has indicated his/her understanding and acceptance.     Dental advisory given  Plan Discussed with: CRNA  Anesthesia Plan Comments: ( )        Anesthesia Quick Evaluation

## 2022-09-01 NOTE — Progress Notes (Signed)
Anesthesia Chart Review:  History of CVA 2019, maintained on Plavix.  Echo at that time showed EF 60 to 65%, normal wall motion, no significant valvular abnormalities.  Carotid plaque showed minimal stenosis.  Patient reports last dose Plavix 09/03/2022.  Former smoker with associated COPD.  He is maintained on Trelegy Ellipta and as needed albuterol.  Preop labs reviewed, total bilirubin mildly elevated 1.9 (review of records shows chronic mild elevations), otherwise unremarkable.  EKG 12/27/2021: NSR.  Rate 61.  Inferior infarct, age-indeterminate.  Cannot rule out anterior infarct, age undetermined.  CTA chest 12/27/2021: IMPRESSION: No evidence of pulmonary embolus.   Coronary artery disease.   Left base atelectasis.   Aortic Atherosclerosis (ICD10-I70.0).  Carotid duplex 03/11/2018: Final Interpretation:  Right Carotid: Velocities in the right ICA are consistent with a 1-39% stenosis.   Left Carotid: Velocities in the left ICA are consistent with a 1-39% stenosis.   Vertebrals:  Bilateral vertebral arteries demonstrate antegrade flow.  Subclavians: Normal flow hemodynamics were seen in bilateral subclavian arteries.   TTE 03/11/2018: - Left ventricle: The cavity size was normal. Wall thickness was    normal. Systolic function was normal. The estimated ejection    fraction was in the range of 60% to 65%. Wall motion was normal;    there were no regional wall motion abnormalities. Left    ventricular diastolic function parameters were normal.  - Aortic valve: Sclerosis without stenosis. There was no    regurgitation.  - Mitral valve: Mildly thickened leaflets . There was trivial    regurgitation.  - Left atrium: Moderately dilated.  - Right atrium: Moderately dilated.  - Tricuspid valve: There was trivial regurgitation.  - Pulmonary arteries: PA peak pressure: 40 mm Hg (S).  - Inferior vena cava: The vessel was dilated. The respirophasic    diameter changes were blunted (<  50%), consistent with elevated    central venous pressure.   Impressions:   - LVEF 60-65%, normal wall thickness, normal wall motion, normal    diastolic function, trivial MR, moderate biatrial enlargement,    trivial TR, RVSP 40 mmHg, dilated IVC.     Wynonia Musty [facility] Short Stay Center/Anesthesiology Phone [phone] 09/01/2022 2:30 PM

## 2022-09-07 ENCOUNTER — Other Ambulatory Visit: Payer: Self-pay | Admitting: Physician Assistant

## 2022-09-09 ENCOUNTER — Ambulatory Visit (HOSPITAL_COMMUNITY): Payer: Medicare HMO | Admitting: Physician Assistant

## 2022-09-09 ENCOUNTER — Ambulatory Visit (HOSPITAL_COMMUNITY): Payer: Medicare HMO

## 2022-09-09 ENCOUNTER — Encounter (HOSPITAL_COMMUNITY): Payer: Self-pay | Admitting: Orthopedic Surgery

## 2022-09-09 ENCOUNTER — Inpatient Hospital Stay (HOSPITAL_COMMUNITY)
Admission: AD | Admit: 2022-09-09 | Discharge: 2022-09-10 | DRG: 455 | Disposition: A | Discharge status: Home / Self Care | Payer: Medicare HMO | Attending: Orthopedic Surgery | Admitting: Orthopedic Surgery

## 2022-09-09 ENCOUNTER — Other Ambulatory Visit: Payer: Self-pay

## 2022-09-09 ENCOUNTER — Encounter (HOSPITAL_COMMUNITY)
Admission: AD | Disposition: A | Discharge status: Home / Self Care | Payer: Self-pay | Source: Home / Self Care | Attending: Orthopedic Surgery

## 2022-09-09 ENCOUNTER — Ambulatory Visit (HOSPITAL_BASED_OUTPATIENT_CLINIC_OR_DEPARTMENT_OTHER): Payer: Medicare HMO | Admitting: Anesthesiology

## 2022-09-09 DIAGNOSIS — Z7902 Long term (current) use of antithrombotics/antiplatelets: Secondary | ICD-10-CM | POA: Diagnosis not present

## 2022-09-09 DIAGNOSIS — Z79899 Other long term (current) drug therapy: Secondary | ICD-10-CM | POA: Diagnosis not present

## 2022-09-09 DIAGNOSIS — M4316 Spondylolisthesis, lumbar region: Secondary | ICD-10-CM | POA: Diagnosis not present

## 2022-09-09 DIAGNOSIS — M5116 Intervertebral disc disorders with radiculopathy, lumbar region: Principal | ICD-10-CM | POA: Diagnosis present

## 2022-09-09 DIAGNOSIS — Z87891 Personal history of nicotine dependence: Secondary | ICD-10-CM | POA: Diagnosis not present

## 2022-09-09 DIAGNOSIS — J449 Chronic obstructive pulmonary disease, unspecified: Secondary | ICD-10-CM | POA: Diagnosis not present

## 2022-09-09 DIAGNOSIS — M48061 Spinal stenosis, lumbar region without neurogenic claudication: Secondary | ICD-10-CM | POA: Diagnosis present

## 2022-09-09 DIAGNOSIS — M4326 Fusion of spine, lumbar region: Secondary | ICD-10-CM | POA: Diagnosis not present

## 2022-09-09 DIAGNOSIS — E785 Hyperlipidemia, unspecified: Secondary | ICD-10-CM | POA: Diagnosis present

## 2022-09-09 DIAGNOSIS — Z981 Arthrodesis status: Secondary | ICD-10-CM

## 2022-09-09 DIAGNOSIS — M5416 Radiculopathy, lumbar region: Secondary | ICD-10-CM | POA: Diagnosis not present

## 2022-09-09 DIAGNOSIS — Z8673 Personal history of transient ischemic attack (TIA), and cerebral infarction without residual deficits: Secondary | ICD-10-CM | POA: Diagnosis not present

## 2022-09-09 DIAGNOSIS — Z825 Family history of asthma and other chronic lower respiratory diseases: Secondary | ICD-10-CM

## 2022-09-09 DIAGNOSIS — I10 Essential (primary) hypertension: Secondary | ICD-10-CM

## 2022-09-09 DIAGNOSIS — Z801 Family history of malignant neoplasm of trachea, bronchus and lung: Secondary | ICD-10-CM

## 2022-09-09 HISTORY — PX: TRANSFORAMINAL LUMBAR INTERBODY FUSION (TLIF) WITH PEDICLE SCREW FIXATION 1 LEVEL: SHX6141

## 2022-09-09 LAB — ABO/RH: ABO/RH(D): O POS

## 2022-09-09 SURGERY — TRANSFORAMINAL LUMBAR INTERBODY FUSION (TLIF) WITH PEDICLE SCREW FIXATION 1 LEVEL
Anesthesia: General | Site: Spine Lumbar | Laterality: Right

## 2022-09-09 MED ORDER — MIDAZOLAM HCL 2 MG/2ML IJ SOLN
INTRAMUSCULAR | Status: AC
  Filled 2022-09-09: qty 2

## 2022-09-09 MED ORDER — PHENYLEPHRINE 80 MCG/ML (10ML) SYRINGE FOR IV PUSH (FOR BLOOD PRESSURE SUPPORT)
PREFILLED_SYRINGE | INTRAVENOUS | Status: AC
  Filled 2022-09-09: qty 10

## 2022-09-09 MED ORDER — PANTOPRAZOLE SODIUM 40 MG PO TBEC
40.0000 mg | DELAYED_RELEASE_TABLET | Freq: Every day | ORAL | Status: DC
  Administered 2022-09-10: 40 mg via ORAL
  Filled 2022-09-09: qty 1

## 2022-09-09 MED ORDER — ONDANSETRON HCL 4 MG/2ML IJ SOLN: 4.0000 mg | Freq: Four times a day (QID) | INTRAMUSCULAR | Status: DC | PRN

## 2022-09-09 MED ORDER — 0.9 % SODIUM CHLORIDE (POUR BTL) OPTIME
TOPICAL | Status: DC | PRN
  Administered 2022-09-09: 1000 mL

## 2022-09-09 MED ORDER — ONDANSETRON HCL 4 MG PO TABS: 4.0000 mg | ORAL_TABLET | Freq: Four times a day (QID) | ORAL | Status: DC | PRN

## 2022-09-09 MED ORDER — METOPROLOL SUCCINATE ER 50 MG PO TB24
50.0000 mg | ORAL_TABLET | Freq: Every day | ORAL | Status: DC
  Administered 2022-09-09: 50 mg via ORAL
  Filled 2022-09-09: qty 1

## 2022-09-09 MED ORDER — SODIUM CHLORIDE 0.9 % IV SOLN
250.0000 mL | INTRAVENOUS | Status: DC
  Administered 2022-09-09: 250 mL via INTRAVENOUS

## 2022-09-09 MED ORDER — ROSUVASTATIN CALCIUM 20 MG PO TABS
40.0000 mg | ORAL_TABLET | Freq: Every day | ORAL | Status: DC
  Administered 2022-09-09: 40 mg via ORAL
  Filled 2022-09-09 (×2): qty 2

## 2022-09-09 MED ORDER — OXYCODONE HCL 5 MG/5ML PO SOLN: 5.0000 mg | Freq: Once | ORAL | Status: DC | PRN

## 2022-09-09 MED ORDER — CEFAZOLIN SODIUM-DEXTROSE 2-4 GM/100ML-% IV SOLN
2.0000 g | Freq: Three times a day (TID) | INTRAVENOUS | Status: AC
  Administered 2022-09-09 (×2): 2 g via INTRAVENOUS
  Filled 2022-09-09 (×2): qty 100

## 2022-09-09 MED ORDER — EPHEDRINE 5 MG/ML INJ
INTRAVENOUS | Status: AC
  Filled 2022-09-09: qty 5

## 2022-09-09 MED ORDER — ORAL CARE MOUTH RINSE: 15.0000 mL | Freq: Once | OROMUCOSAL | Status: AC

## 2022-09-09 MED ORDER — POVIDONE-IODINE 7.5 % EX SOLN: Freq: Once | CUTANEOUS | Status: DC

## 2022-09-09 MED ORDER — MENTHOL 3 MG MT LOZG: 1.0000 | LOZENGE | OROMUCOSAL | Status: DC | PRN

## 2022-09-09 MED ORDER — SODIUM CHLORIDE 0.9% FLUSH: 3.0000 mL | INTRAVENOUS | Status: DC | PRN

## 2022-09-09 MED ORDER — FLUTICASONE FUROATE-VILANTEROL 100-25 MCG/ACT IN AEPB
1.0000 | INHALATION_SPRAY | Freq: Every day | RESPIRATORY_TRACT | Status: DC
  Administered 2022-09-10: 1 via RESPIRATORY_TRACT
  Filled 2022-09-09: qty 28

## 2022-09-09 MED ORDER — OXYCODONE HCL 5 MG PO TABS: 5.0000 mg | ORAL_TABLET | Freq: Once | ORAL | Status: DC | PRN

## 2022-09-09 MED ORDER — ONDANSETRON HCL 4 MG/2ML IJ SOLN
INTRAMUSCULAR | Status: DC | PRN
  Administered 2022-09-09: 4 mg via INTRAVENOUS

## 2022-09-09 MED ORDER — METHOCARBAMOL 500 MG PO TABS: 500.0000 mg | ORAL_TABLET | Freq: Four times a day (QID) | ORAL | 2 refills | Status: DC | PRN

## 2022-09-09 MED ORDER — BUPIVACAINE LIPOSOME 1.3 % IJ SUSP
INTRAMUSCULAR | Status: AC
  Filled 2022-09-09: qty 20

## 2022-09-09 MED ORDER — BUPIVACAINE-EPINEPHRINE 0.25% -1:200000 IJ SOLN
INTRAMUSCULAR | Status: DC | PRN
  Administered 2022-09-09: 10 mL

## 2022-09-09 MED ORDER — MORPHINE SULFATE (PF) 2 MG/ML IV SOLN: 1.0000 mg | INTRAVENOUS | Status: DC | PRN

## 2022-09-09 MED ORDER — KETAMINE HCL 50 MG/ML IJ SOLN
INTRAMUSCULAR | Status: DC | PRN
  Administered 2022-09-09: 30 mg via INTRAMUSCULAR

## 2022-09-09 MED ORDER — PROPOFOL 10 MG/ML IV BOLUS
INTRAVENOUS | Status: AC
  Filled 2022-09-09: qty 20

## 2022-09-09 MED ORDER — ALUM & MAG HYDROXIDE-SIMETH 200-200-20 MG/5ML PO SUSP: 30.0000 mL | Freq: Four times a day (QID) | ORAL | Status: DC | PRN

## 2022-09-09 MED ORDER — FLEET ENEMA 7-19 GM/118ML RE ENEM: 1.0000 | ENEMA | Freq: Once | RECTAL | Status: DC | PRN

## 2022-09-09 MED ORDER — PHENYLEPHRINE 80 MCG/ML (10ML) SYRINGE FOR IV PUSH (FOR BLOOD PRESSURE SUPPORT)
PREFILLED_SYRINGE | INTRAVENOUS | Status: DC | PRN
  Administered 2022-09-09 (×3): 80 ug via INTRAVENOUS

## 2022-09-09 MED ORDER — DEXAMETHASONE SODIUM PHOSPHATE 10 MG/ML IJ SOLN
INTRAMUSCULAR | Status: DC | PRN
  Administered 2022-09-09: 10 mg via INTRAVENOUS

## 2022-09-09 MED ORDER — ROCURONIUM BROMIDE 10 MG/ML (PF) SYRINGE
PREFILLED_SYRINGE | INTRAVENOUS | Status: DC | PRN
  Administered 2022-09-09: 100 mg via INTRAVENOUS

## 2022-09-09 MED ORDER — DOCUSATE SODIUM 100 MG PO CAPS
100.0000 mg | ORAL_CAPSULE | Freq: Two times a day (BID) | ORAL | Status: DC
  Administered 2022-09-09 – 2022-09-10 (×3): 100 mg via ORAL
  Filled 2022-09-09 (×3): qty 1

## 2022-09-09 MED ORDER — ALBUTEROL SULFATE (2.5 MG/3ML) 0.083% IN NEBU: 2.5000 mg | INHALATION_SOLUTION | Freq: Four times a day (QID) | RESPIRATORY_TRACT | Status: DC | PRN

## 2022-09-09 MED ORDER — AMISULPRIDE (ANTIEMETIC) 5 MG/2ML IV SOLN: 10.0000 mg | Freq: Once | INTRAVENOUS | Status: DC | PRN

## 2022-09-09 MED ORDER — HEMOSTATIC AGENTS (NO CHARGE) OPTIME
TOPICAL | Status: DC | PRN
  Administered 2022-09-09: 1 via TOPICAL

## 2022-09-09 MED ORDER — FENTANYL CITRATE (PF) 250 MCG/5ML IJ SOLN
INTRAMUSCULAR | Status: DC | PRN
  Administered 2022-09-09 (×2): 50 ug via INTRAVENOUS
  Administered 2022-09-09: 100 ug via INTRAVENOUS
  Administered 2022-09-09: 50 ug via INTRAVENOUS

## 2022-09-09 MED ORDER — HYDROMORPHONE HCL 1 MG/ML IJ SOLN: 0.2500 mg | INTRAMUSCULAR | Status: DC | PRN

## 2022-09-09 MED ORDER — CEFAZOLIN SODIUM-DEXTROSE 2-4 GM/100ML-% IV SOLN
2.0000 g | INTRAVENOUS | Status: AC
  Administered 2022-09-09: 2 g via INTRAVENOUS
  Filled 2022-09-09: qty 100

## 2022-09-09 MED ORDER — HYDROMORPHONE HCL 1 MG/ML IJ SOLN
INTRAMUSCULAR | Status: AC
  Filled 2022-09-09: qty 0.5

## 2022-09-09 MED ORDER — LIDOCAINE 2% (20 MG/ML) 5 ML SYRINGE
INTRAMUSCULAR | Status: AC
  Filled 2022-09-09: qty 5

## 2022-09-09 MED ORDER — BACLOFEN 10 MG PO TABS
10.0000 mg | ORAL_TABLET | Freq: Three times a day (TID) | ORAL | Status: DC
  Administered 2022-09-09 – 2022-09-10 (×3): 10 mg via ORAL
  Filled 2022-09-09 (×3): qty 1

## 2022-09-09 MED ORDER — SENNOSIDES-DOCUSATE SODIUM 8.6-50 MG PO TABS: 1.0000 | ORAL_TABLET | Freq: Every evening | ORAL | Status: DC | PRN

## 2022-09-09 MED ORDER — OXYCODONE-ACETAMINOPHEN 5-325 MG PO TABS
1.0000 | ORAL_TABLET | ORAL | Status: DC | PRN
  Administered 2022-09-09 – 2022-09-10 (×4): 2 via ORAL
  Filled 2022-09-09 (×4): qty 2

## 2022-09-09 MED ORDER — BUPIVACAINE LIPOSOME 1.3 % IJ SUSP
INTRAMUSCULAR | Status: DC | PRN
  Administered 2022-09-09: 20 mL

## 2022-09-09 MED ORDER — LIDOCAINE 2% (20 MG/ML) 5 ML SYRINGE
INTRAMUSCULAR | Status: DC | PRN
  Administered 2022-09-09: 60 mg via INTRAVENOUS

## 2022-09-09 MED ORDER — ONDANSETRON HCL 4 MG/2ML IJ SOLN
INTRAMUSCULAR | Status: AC
  Filled 2022-09-09: qty 2

## 2022-09-09 MED ORDER — THROMBIN 20000 UNITS EX SOLR
CUTANEOUS | Status: DC | PRN
  Administered 2022-09-09: 20000 [IU] via TOPICAL

## 2022-09-09 MED ORDER — METHOCARBAMOL 1000 MG/10ML IJ SOLN: 500.0000 mg | Freq: Four times a day (QID) | INTRAVENOUS | Status: DC | PRN

## 2022-09-09 MED ORDER — SODIUM CHLORIDE 0.9% FLUSH
3.0000 mL | Freq: Two times a day (BID) | INTRAVENOUS | Status: DC
  Administered 2022-09-09: 3 mL via INTRAVENOUS

## 2022-09-09 MED ORDER — KETAMINE HCL 50 MG/5ML IJ SOSY
PREFILLED_SYRINGE | INTRAMUSCULAR | Status: AC
  Filled 2022-09-09: qty 5

## 2022-09-09 MED ORDER — ROCURONIUM BROMIDE 10 MG/ML (PF) SYRINGE
PREFILLED_SYRINGE | INTRAVENOUS | Status: AC
  Filled 2022-09-09: qty 10

## 2022-09-09 MED ORDER — ACETAMINOPHEN 650 MG RE SUPP: 650.0000 mg | RECTAL | Status: DC | PRN

## 2022-09-09 MED ORDER — ZOLPIDEM TARTRATE 5 MG PO TABS: 5.0000 mg | ORAL_TABLET | Freq: Every evening | ORAL | Status: DC | PRN

## 2022-09-09 MED ORDER — METHOCARBAMOL 500 MG PO TABS
500.0000 mg | ORAL_TABLET | Freq: Four times a day (QID) | ORAL | Status: DC | PRN
  Administered 2022-09-09: 1000 mg via ORAL
  Administered 2022-09-09 – 2022-09-10 (×2): 500 mg via ORAL
  Filled 2022-09-09: qty 1
  Filled 2022-09-09: qty 2
  Filled 2022-09-09: qty 1

## 2022-09-09 MED ORDER — POTASSIUM CHLORIDE IN NACL 20-0.9 MEQ/L-% IV SOLN: INTRAVENOUS | Status: DC

## 2022-09-09 MED ORDER — LACTATED RINGERS IV SOLN: INTRAVENOUS | Status: DC

## 2022-09-09 MED ORDER — ONDANSETRON HCL 4 MG/2ML IJ SOLN: 4.0000 mg | Freq: Once | INTRAMUSCULAR | Status: DC | PRN

## 2022-09-09 MED ORDER — HYDROMORPHONE HCL 1 MG/ML IJ SOLN
INTRAMUSCULAR | Status: DC | PRN
  Administered 2022-09-09 (×2): .5 mg via INTRAVENOUS

## 2022-09-09 MED ORDER — SUCCINYLCHOLINE CHLORIDE 200 MG/10ML IV SOSY
PREFILLED_SYRINGE | INTRAVENOUS | Status: AC
  Filled 2022-09-09: qty 10

## 2022-09-09 MED ORDER — DEXAMETHASONE SODIUM PHOSPHATE 10 MG/ML IJ SOLN
INTRAMUSCULAR | Status: AC
  Filled 2022-09-09: qty 1

## 2022-09-09 MED ORDER — CHLORHEXIDINE GLUCONATE 0.12 % MT SOLN
15.0000 mL | Freq: Once | OROMUCOSAL | Status: AC
  Administered 2022-09-09: 15 mL via OROMUCOSAL
  Filled 2022-09-09: qty 15

## 2022-09-09 MED ORDER — ACETAMINOPHEN 500 MG PO TABS
1000.0000 mg | ORAL_TABLET | Freq: Once | ORAL | Status: AC
  Administered 2022-09-09: 500 mg via ORAL
  Filled 2022-09-09: qty 2

## 2022-09-09 MED ORDER — MIDAZOLAM HCL 2 MG/2ML IJ SOLN
INTRAMUSCULAR | Status: DC | PRN
  Administered 2022-09-09: 2 mg via INTRAVENOUS

## 2022-09-09 MED ORDER — PHENOL 1.4 % MT LIQD: 1.0000 | OROMUCOSAL | Status: DC | PRN

## 2022-09-09 MED ORDER — PROPOFOL 10 MG/ML IV BOLUS
INTRAVENOUS | Status: DC | PRN
  Administered 2022-09-09: 150 mg via INTRAVENOUS

## 2022-09-09 MED ORDER — FENTANYL CITRATE (PF) 250 MCG/5ML IJ SOLN
INTRAMUSCULAR | Status: AC
  Filled 2022-09-09: qty 5

## 2022-09-09 MED ORDER — HYDROCODONE-ACETAMINOPHEN 5-325 MG PO TABS: 1.0000 | ORAL_TABLET | ORAL | Status: DC | PRN

## 2022-09-09 MED ORDER — METHYLENE BLUE 1 % INJ SOLN
INTRAVENOUS | Status: AC
  Filled 2022-09-09: qty 10

## 2022-09-09 MED ORDER — BISACODYL 5 MG PO TBEC: 5.0000 mg | DELAYED_RELEASE_TABLET | Freq: Every day | ORAL | Status: DC | PRN

## 2022-09-09 MED ORDER — SUGAMMADEX SODIUM 200 MG/2ML IV SOLN
INTRAVENOUS | Status: DC | PRN
  Administered 2022-09-09: 200 mg via INTRAVENOUS
  Administered 2022-09-09: 100 mg via INTRAVENOUS

## 2022-09-09 MED ORDER — EPHEDRINE SULFATE-NACL 50-0.9 MG/10ML-% IV SOSY
PREFILLED_SYRINGE | INTRAVENOUS | Status: DC | PRN
  Administered 2022-09-09: 5 mg via INTRAVENOUS
  Administered 2022-09-09 (×3): 10 mg via INTRAVENOUS

## 2022-09-09 MED ORDER — OXYCODONE-ACETAMINOPHEN 5-325 MG PO TABS: 1.0000 | ORAL_TABLET | ORAL | 0 refills | Status: DC | PRN

## 2022-09-09 MED ORDER — ACETAMINOPHEN 325 MG PO TABS: 650.0000 mg | ORAL_TABLET | ORAL | Status: DC | PRN

## 2022-09-09 MED ORDER — UMECLIDINIUM BROMIDE 62.5 MCG/ACT IN AEPB
1.0000 | INHALATION_SPRAY | Freq: Every day | RESPIRATORY_TRACT | Status: DC
  Filled 2022-09-09: qty 7

## 2022-09-09 MED ORDER — BUPIVACAINE-EPINEPHRINE (PF) 0.25% -1:200000 IJ SOLN
INTRAMUSCULAR | Status: AC
  Filled 2022-09-09: qty 30

## 2022-09-09 MED ORDER — THROMBIN 20000 UNITS EX SOLR
CUTANEOUS | Status: AC
  Filled 2022-09-09: qty 20000

## 2022-09-09 SURGICAL SUPPLY — 90 items
AGENT HMST KT MTR STRL THRMB (HEMOSTASIS)
APL SKNCLS STERI-STRIP NONHPOA (GAUZE/BANDAGES/DRESSINGS) ×1
BAG COUNTER SPONGE SURGICOUNT (BAG) ×1 IMPLANT
BAG SPNG CNTER NS LX DISP (BAG) ×1
BENZOIN TINCTURE PRP APPL 2/3 (GAUZE/BANDAGES/DRESSINGS) ×1 IMPLANT
BIT DRILL NEURO 2X3.1 SFT TUCH (MISCELLANEOUS) IMPLANT
BLADE CLIPPER SURG (BLADE) IMPLANT
BUR PRESCISION 1.7 ELITE (BURR) ×1 IMPLANT
BUR ROUND FLUTED 5 RND (BURR) ×1 IMPLANT
BUR ROUND PRECISION 4.0 (BURR) IMPLANT
BUR SABER RD CUTTING 3.0 (BURR) IMPLANT
CAGE SABLE 10X30 9-16 8D (Cage) IMPLANT
CANNULA GRAFT BNE VG PRE-FILL (Bone Implant) IMPLANT
CLSR STERI-STRIP ANTIMIC 1/2X4 (GAUZE/BANDAGES/DRESSINGS) IMPLANT
CNTNR URN SCR LID CUP LEK RST (MISCELLANEOUS) ×1 IMPLANT
CONT SPEC 4OZ STRL OR WHT (MISCELLANEOUS) ×1
COVER BACK TABLE 60X90IN (DRAPES) ×1 IMPLANT
COVER MAYO STAND STRL (DRAPES) ×2 IMPLANT
COVER SURGICAL LIGHT HANDLE (MISCELLANEOUS) ×1 IMPLANT
DISPENSER GRAFT BNE VG (MISCELLANEOUS) IMPLANT
DISPENSER VIVIGEN BONE GRAFT (MISCELLANEOUS) ×1 IMPLANT
DRAIN CHANNEL 15F RND FF W/TCR (WOUND CARE) IMPLANT
DRAPE C-ARM 42X72 X-RAY (DRAPES) ×1 IMPLANT
DRAPE C-ARMOR (DRAPES) IMPLANT
DRAPE POUCH INSTRU U-SHP 10X18 (DRAPES) ×1 IMPLANT
DRAPE SURG 17X23 STRL (DRAPES) ×4 IMPLANT
DRILL NEURO 2X3.1 SOFT TOUCH (MISCELLANEOUS) ×1
DURAPREP 26ML APPLICATOR (WOUND CARE) ×1 IMPLANT
ELECT BLADE 4.0 EZ CLEAN MEGAD (MISCELLANEOUS) ×1
ELECT CAUTERY BLADE 6.4 (BLADE) ×1 IMPLANT
ELECT REM PT RETURN 9FT ADLT (ELECTROSURGICAL) ×1
ELECTRODE BLDE 4.0 EZ CLN MEGD (MISCELLANEOUS) ×1 IMPLANT
ELECTRODE REM PT RTRN 9FT ADLT (ELECTROSURGICAL) ×1 IMPLANT
EVACUATOR SILICONE 100CC (DRAIN) IMPLANT
FILTER STRAW FLUID ASPIR (MISCELLANEOUS) ×1 IMPLANT
GAUZE 4X4 16PLY ~~LOC~~+RFID DBL (SPONGE) ×1 IMPLANT
GAUZE SPONGE 4X4 12PLY STRL (GAUZE/BANDAGES/DRESSINGS) ×1 IMPLANT
GLOVE BIO SURGEON STRL SZ7 (GLOVE) ×1 IMPLANT
GLOVE BIO SURGEON STRL SZ8 (GLOVE) ×1 IMPLANT
GLOVE BIOGEL PI IND STRL 7.0 (GLOVE) ×1 IMPLANT
GLOVE BIOGEL PI IND STRL 8 (GLOVE) ×1 IMPLANT
GLOVE SURG ENC MOIS LTX SZ6.5 (GLOVE) ×1 IMPLANT
GOWN STRL REUS W/ TWL LRG LVL3 (GOWN DISPOSABLE) ×2 IMPLANT
GOWN STRL REUS W/ TWL XL LVL3 (GOWN DISPOSABLE) ×1 IMPLANT
GOWN STRL REUS W/TWL LRG LVL3 (GOWN DISPOSABLE) ×2
GOWN STRL REUS W/TWL XL LVL3 (GOWN DISPOSABLE) ×1
GRAFT BONE CANNULA VIVIGEN 3 (Bone Implant) ×4 IMPLANT
IV CATH 14GX2 1/4 (CATHETERS) ×1 IMPLANT
KIT BASIN OR (CUSTOM PROCEDURE TRAY) ×1 IMPLANT
KIT POSITION SURG JACKSON T1 (MISCELLANEOUS) ×1 IMPLANT
KIT TURNOVER KIT B (KITS) ×1 IMPLANT
MARKER SKIN DUAL TIP RULER LAB (MISCELLANEOUS) ×2 IMPLANT
NDL 18GX1X1/2 (RX/OR ONLY) (NEEDLE) ×1 IMPLANT
NDL 22X1.5 STRL (OR ONLY) (MISCELLANEOUS) ×2 IMPLANT
NDL HYPO 25GX1X1/2 BEV (NEEDLE) ×1 IMPLANT
NDL SPNL 18GX3.5 QUINCKE PK (NEEDLE) ×2 IMPLANT
NEEDLE 18GX1X1/2 (RX/OR ONLY) (NEEDLE) ×1 IMPLANT
NEEDLE 22X1.5 STRL (OR ONLY) (MISCELLANEOUS) ×2 IMPLANT
NEEDLE HYPO 25GX1X1/2 BEV (NEEDLE) ×1 IMPLANT
NEEDLE SPNL 18GX3.5 QUINCKE PK (NEEDLE) ×2 IMPLANT
NS IRRIG 1000ML POUR BTL (IV SOLUTION) ×1 IMPLANT
PACK LAMINECTOMY ORTHO (CUSTOM PROCEDURE TRAY) ×1 IMPLANT
PACK UNIVERSAL I (CUSTOM PROCEDURE TRAY) ×1 IMPLANT
PAD ARMBOARD 7.5X6 YLW CONV (MISCELLANEOUS) ×2 IMPLANT
PATTIES SURGICAL .5 X1 (DISPOSABLE) ×1 IMPLANT
PATTIES SURGICAL .5X1.5 (GAUZE/BANDAGES/DRESSINGS) ×1 IMPLANT
PENCIL BUTTON HOLSTER BLD 10FT (ELECTRODE) IMPLANT
ROD PRE BENT EXP 40MM (Rod) IMPLANT
SCREW SET SINGLE INNER (Screw) IMPLANT
SCREW VIPER CORT FIX 6.00X30 (Screw) IMPLANT
SPONGE INTESTINAL PEANUT (DISPOSABLE) ×1 IMPLANT
SPONGE SURGIFOAM ABS GEL 100 (HEMOSTASIS) ×1 IMPLANT
STRIP CLOSURE SKIN 1/2X4 (GAUZE/BANDAGES/DRESSINGS) ×2 IMPLANT
SURGIFLO W/THROMBIN 8M KIT (HEMOSTASIS) IMPLANT
SUT MNCRL AB 4-0 PS2 18 (SUTURE) ×1 IMPLANT
SUT VIC AB 0 CT1 18XCR BRD 8 (SUTURE) ×1 IMPLANT
SUT VIC AB 0 CT1 8-18 (SUTURE) ×1
SUT VIC AB 1 CT1 18XCR BRD 8 (SUTURE) ×1 IMPLANT
SUT VIC AB 1 CT1 8-18 (SUTURE) ×1
SUT VIC AB 2-0 CT2 18 VCP726D (SUTURE) ×1 IMPLANT
SYR 20ML LL LF (SYRINGE) ×2 IMPLANT
SYR BULB IRRIG 60ML STRL (SYRINGE) ×1 IMPLANT
SYR CONTROL 10ML LL (SYRINGE) ×2 IMPLANT
SYR TB 1ML LUER SLIP (SYRINGE) ×1 IMPLANT
TAP EXPEDIUM DL 5.0 (INSTRUMENTS) IMPLANT
TAP EXPEDIUM DL 6.0 (INSTRUMENTS) IMPLANT
TAPE CLOTH SURG 4X10 WHT LF (GAUZE/BANDAGES/DRESSINGS) IMPLANT
TRAY FOLEY MTR SLVR 16FR STAT (SET/KITS/TRAYS/PACK) ×1 IMPLANT
WATER STERILE IRR 1000ML POUR (IV SOLUTION) ×1 IMPLANT
YANKAUER SUCT BULB TIP NO VENT (SUCTIONS) ×1 IMPLANT

## 2022-09-09 NOTE — Op Note (Signed)
PATIENT NAME: Daniel [name]   MEDICAL RECORD NO.:   [id number]   DATE OF BIRTH: [birth year]   DATE OF PROCEDURE: 09/09/2022                               OPERATIVE REPORT     PREOPERATIVE DIAGNOSES: 1. Right-sided lumbar radiculopathy. 2. L4-5 spinal stenosis. 3. L4/5 spondylolisthesis    POSTOPERATIVE DIAGNOSES: 1. Right-sided lumbar radiculopathy. 2. L4-5 spinal stenosis. 3. L4/5 spondylolisthesis   PROCEDURES: 1. L4/5 decompression 2. Right-sided L4-5 transforaminal lumbar interbody fusion. 3. Left-sided L4-5 posterolateral fusion. 4. Insertion of interbody device x1 (Globus expandable intervertebral spacer). 5. Placement of segmental posterior instrumentation L4, L5 bilaterally  6. Use of local autograft. 7. Use of morselized allograft - Vivigen 8. Intraoperative use of fluoroscopy.   SURGEON:  Phylliss Bob, MD.   ASSISTANTPricilla Holm, PA-C.   ANESTHESIA:  General endotracheal anesthesia.   COMPLICATIONS:  None.   DISPOSITION:  Stable.   ESTIMATED BLOOD LOSS:  100cc   INDICATIONS FOR SURGERY:  Briefly,  Mr. Wallen is a pleasant 66 -year-old male who did present to me with severe and ongoing pain in the right leg. I did feel that the symptoms were secondary to the findings noted above.  He did fail conservative care, and did wish to proceed with the surgery noted above.    OPERATIVE DETAILS:  On 09/09/2022, the patient was brought to surgery and general endotracheal anesthesia was administered.  The patient was placed prone on a well-padded flat Jackson bed with a spinal frame.  Antibiotics were given and a time-out procedure was performed. The back was prepped and draped in the usual fashion.  A midline incision was made overlying the L4-5 intervertebral spaces.  The fascia was incised at the midline.  The paraspinal musculature was bluntly swept laterally.  Anatomic landmarks for the pedicles were exposed. Using fluoroscopy, I did cannulate the L4  and L5 pedicles bilaterally, using a medial to lateral cortical trajectory technique.  At this point, 6 x 30 mm screws were placed into the left pedicles, and a 40 mm rod was placed into the tulip heads of the screw, and caps were also placed. Distraction was then applied across the L4-5 intervertebral space, and the caps were then provisionally tightened.  On the right side, bone wax was placed into the cannulated pedicle holes.  I then proceeded with the decompressive aspect of the procedure at the L4-5 level.  A partial facetectomy was performed bilaterally at L4-5, decompressing the L4-5 intervertebral space.  On the right side, there was a foraminal extraforaminal disc herniation clearly noted, immediately beneath the exiting right L4 nerve.  Multiple herniated fragments were removed, thereby decompressing the exiting right L4 nerve. I was very pleased with the decompression. With an assistant holding medial retraction of the traversing right L5 nerve, I did perform an annulotomy at the posterolateral aspect of the L4-5 intervertebral space.  I then used a series of curettes and pituitary rongeurs to perform a thorough and complete intervertebral diskectomy.  The intervertebral space was then liberally packed with autograft as well as allograft in the form of Vivigen, as was the appropriate-sized intervertebral spacer. The spacer was then tamped into position in the usual fashion, and expanded to approximately 14.4 mm in height.  I was very pleased with the press-fit of the spacer.  I then placed 6 mm screws on the right at L4 and L5.  A 40-mm rod was then placed and caps were placed. The distraction was then released on the contralateral side.  All caps were then locked.  The wound was copiously irrigated with a total of approximately 3 L prior to placing the bone graft.  Additional autograft and allograft was then packed into the posterolateral gutter on the left side to help aid in the success of  the fusion.  The wound was explored for any undue bleeding and there was no substantial bleeding encountered.  Gel-Foam was placed over the laminectomy site.  The wound was then closed in layers using #1 Vicryl followed by 2-0 Vicryl, followed by 4-0 Monocryl.  Benzoin and Steri-Strips were applied followed by sterile dressing.   Of note, Pricilla Holm was my assistant throughout surgery, and did aid in retraction, suctioning, placement of the hardware, and closure.       Phylliss Bob, MD

## 2022-09-09 NOTE — Anesthesia Procedure Notes (Signed)
Procedure Name: Intubation Date/Time: 09/09/2022 8:40 AM  Performed by: Lind Guest, CRNAPre-anesthesia Checklist: Patient identified, Emergency Drugs available, Suction available, Patient being monitored and Timeout performed Patient Re-evaluated:Patient Re-evaluated prior to induction Oxygen Delivery Method: Circle system utilized Preoxygenation: Pre-oxygenation with 100% oxygen Induction Type: IV induction Ventilation: Mask ventilation without difficulty Laryngoscope Size: Glidescope and 4 Grade View: Grade I Tube type: Oral Tube size: 7.5 mm Number of attempts: 1 Placement Confirmation: ETT inserted through vocal cords under direct vision, positive ETCO2 and breath sounds checked- equal and bilateral Secured at: 23 cm Tube secured with: Tape Dental Injury: Teeth and Oropharynx as per pre-operative assessment

## 2022-09-09 NOTE — H&P (Signed)
PREOPERATIVE H&P  Chief Complaint: Right leg pain  HPI: Daniel [name] is a 66 y.o. 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   BPH (benign prostatic hyperplasia)    COPD (chronic obstructive pulmonary disease) ([place])    Hyperlipidemia    Hypertension    Pulmonary nodule    renal stents    Stroke [facility])    TIA (transient ischemic attack) 03/11/2018   Past Surgical History:  Procedure Laterality Date   CERVICAL FUSION     PTERYGIUM EXCISION  2020   x2   renal stents     ROTATOR CUFF REPAIR Left 12/2021   Social History   Socioeconomic History   Marital status: Married    Spouse name: Not on file   Number of children: Not on file   Years of education: Not on file   Highest education level: Not on file  Occupational History   Occupation: builder  Tobacco Use   Smoking status: Former    Packs/day: 1.00    Years: 42.00    Total pack years: 42.00    Types: Cigarettes   Smokeless tobacco: Never  Vaping Use   Vaping Use: Never used  Substance and Sexual Activity   Alcohol use: Yes    Alcohol/week: 21.0 standard drinks of alcohol    Types: 21 Shots of liquor per week    Comment: 3-3oz shots of bourbon a day   Drug use: Never   Sexual activity: Not on file  Other Topics Concern   Not on file  Social History Narrative   Not on file   Social Determinants of Health   Financial Resource Strain: Not on file  Food Insecurity: Not on file  Transportation Needs: Not on file  Physical Activity: Not on file  Stress: Not on file  Social  Connections: Not on file   Family History  Problem Relation Age of Onset   Emphysema Father    Cancer Father    Lung cancer Mother    No Known Allergies Prior to Admission medications   Medication Sig Start Date End Date Taking? Authorizing Provider  baclofen (LIORESAL) 10 MG tablet Take 1 tablet (10 mg total) by mouth 3 (three) times daily. 10/06/21  Yes McCue, Janett Billow, NP  clopidogrel (PLAVIX) 75 MG tablet TAKE 1 TABLET(75 MG) BY MOUTH DAILY 03/03/22  Yes Stacks, Cletus Gash, MD  diclofenac Sodium (VOLTAREN) 1 % GEL Apply 1 Application topically 3 (three) times daily as needed (pain).   Yes [provider]  Fluticasone-Umeclidin-Vilant (TRELEGY ELLIPTA) 100-62.5-25 MCG/ACT AEPB Inhale 1 puff into the lungs daily. 03/03/22  Yes [name], MD  GLUCOSAMINE-CHONDROITIN PO Take 2 tablets by mouth daily. With Vit D   Yes [provider]  HYDROcodone-acetaminophen (NORCO/VICODIN) 5-325 MG tablet Take 1 tablet by mouth every 6 (six) hours as needed for severe pain. 08/17/22  Yes [provider]  methocarbamol (ROBAXIN) 500 MG tablet Take 1 tablet (500 mg total) by mouth every 6 (six) hours as needed. Patient taking differently: Take 500 mg by mouth every 6 (six) hours as needed  for muscle spasms. 08/04/22  Yes Stacks, Cletus Gash, MD  metoprolol succinate (TOPROL-XL) 50 MG 24 hr tablet TAKE 1 TABLET(50 MG) BY MOUTH DAILY WITH OR IMMEDIATELY FOLLOWING A MEAL 10/09/21  Yes Stacks, Cletus Gash, MD  Oxycodone HCl 10 MG TABS Take 10 mg by mouth daily as needed (Severe Pain).   Yes [provider]  pantoprazole (PROTONIX) 40 MG tablet Take 1 tablet (40 mg total) by mouth daily. For stomach 10/09/21  Yes Stacks, Cletus Gash, MD  rosuvastatin (CRESTOR) 40 MG tablet TAKE 1 TABLET(40 MG) BY MOUTH DAILY FOR CHOLESTEROL 07/18/22  Yes Stacks, Cletus Gash, MD  SUPER B COMPLEX/C PO Take 1 tablet by mouth daily.   Yes [provider]  albuterol (VENTOLIN HFA) 108 (90 Base) MCG/ACT inhaler Inhale 2  puffs into the lungs every 6 (six) hours as needed for wheezing or shortness of breath.    [provider]  predniSONE (DELTASONE) 10 MG tablet Take 5 daily for 3 days followed by 4,3,2 and 1 for 3 days each. Patient not taking: Reported on 08/26/2022 08/04/22   [name], MD     All other systems have been reviewed and were otherwise negative with the exception of those mentioned in the HPI and as above.  Physical Exam: Vitals:   09/09/22 0705  BP: 113/81  Pulse: 64  Resp: 17  Temp: 98.4 F (36.9 C)  SpO2: 95%    Body mass index is 26.45 kg/m.  General: Alert, no acute distress Cardiovascular: No pedal edema Respiratory: No cyanosis, no use of accessory musculature Skin: No lesions in the area of chief complaint Neurologic: Sensation intact distally Psychiatric: Patient is competent for consent with normal mood and affect Lymphatic: No axillary or cervical lymphadenopathy   Assessment/Plan: Ongoing progressive right L4 radiculopathy, secondary to instability at L4-5 in the form of a spondylolisthesis, in addition to a moderate foraminal/extra foraminal disc herniation, compressing the exiting right L4 nerve Plan for Procedure(s): RIGHT-SIDED LUMBAR 4 - LUMBAR 5 TRANSFORAMINAL LUMBAR INTERBODY FUSION AND DECOMPRESSION WITH INSTRUMENTATION AND ALLOGRAFT   Norva Karvonen, MD 09/09/2022 7:30 AM

## 2022-09-09 NOTE — Transfer of Care (Signed)
Immediate Anesthesia Transfer of Care Note  Patient: Daniel [name]  Procedure(s) Performed: RIGHT-SIDED LUMBAR FOUR THROUGH LUMBAR FIVE TRANSFORAMINAL LUMBAR INTERBODY FUSION AND DECOMPRESSION WITH INSTRUMENTATION AND ALLOGRAFT (Right: Spine Lumbar)  Patient Location: PACU  Anesthesia Type:General  Level of Consciousness: awake and oriented  Airway & Oxygen Therapy: Patient Spontanous Breathing and Patient connected to face mask oxygen  Post-op Assessment: Report given to RN and Post -op Vital signs reviewed and stable  Post vital signs: Reviewed and stable  Last Vitals:  Vitals Value Taken Time  BP 142/92 09/09/22 1154  Temp    Pulse 104 09/09/22 1200  Resp 13 09/09/22 1200  SpO2 95 % 09/09/22 1200  Vitals shown include unvalidated device data.  Last Pain:  Vitals:   09/09/22 0728  TempSrc:   PainSc: 4       Patients Stated Pain Goal: 2 (05/69/79 4801)  Complications: No notable events documented.

## 2022-09-09 NOTE — Anesthesia Postprocedure Evaluation (Signed)
Anesthesia Post Note  Patient: Daniel [name]  Procedure(s) Performed: RIGHT-SIDED LUMBAR FOUR THROUGH LUMBAR FIVE TRANSFORAMINAL LUMBAR INTERBODY FUSION AND DECOMPRESSION WITH INSTRUMENTATION AND ALLOGRAFT (Right: Spine Lumbar)     Patient location during evaluation: PACU Anesthesia Type: General Level of consciousness: awake and alert, oriented and patient cooperative Pain management: pain level controlled Vital Signs Assessment: post-procedure vital signs reviewed and stable Respiratory status: spontaneous breathing, nonlabored ventilation and respiratory function stable Cardiovascular status: blood pressure returned to baseline and stable Postop Assessment: no apparent nausea or vomiting Anesthetic complications: no   No notable events documented.  Last Vitals:  Vitals:   09/09/22 1245 09/09/22 1246  BP: 105/73   Pulse: 84 87  Resp: (!) 9 16  Temp:  37.1 C  SpO2: 97% 97%    Last Pain:  Vitals:   09/09/22 1230  TempSrc:   PainSc: 0-No pain                 Pervis Hocking

## 2022-09-10 NOTE — Evaluation (Signed)
Physical Therapy Evaluation Patient Details Name: Daniel [name] MRN: [id number] DOB: January 16, 1956 Today's Date: 09/10/2022  History of Present Illness  66 y/o male s/p R sided L4-5 transforaminal interbody fusion and decompression with instrumentation and allograft on 09/09/22. PMH: CVA, BPH, COPD, HLD, HTN, renal stents, cervical fusion, and L rotator cuff repair.  Clinical Impression  Patient admitted following above procedure. Patient lives with wife who can assist at discharge. Educated on back precautions, brace wear, progressive walking program, and car transfer, patient verbalized understanding. Patient functioning at modI level with no AD and able to negotiate stairs to access home environment. No further skilled PT needs identified acutely. No PT follow up recommended at this time. PT will sign off.        Recommendations for follow up therapy are one component of a multi-disciplinary discharge planning process, led by the attending physician.  Recommendations may be updated based on patient status, additional functional criteria and insurance authorization.  Follow Up Recommendations No PT follow up      Assistance Recommended at Discharge PRN  Patient can return home with the following       Equipment Recommendations None recommended by PT  Recommendations for Other Services       Functional Status Assessment Patient has had a recent decline in their functional status and demonstrates the ability to make significant improvements in function in a reasonable and predictable amount of time.     Precautions / Restrictions Precautions Precautions: Back Precaution Booklet Issued: Yes (comment) Precaution Comments: All precautions reviewed within the context of ADL Required Braces or Orthoses: Spinal Brace Spinal Brace: Thoracolumbosacral orthotic;Applied in sitting position (May ambulate to bathroom and take a shower without brace) Restrictions Weight Bearing Restrictions:  No      Mobility  Bed Mobility Overal bed mobility: Modified Independent             General bed mobility comments: able to recall log roll technique from OT session    Transfers Overall transfer level: Modified independent Equipment used: None                    Ambulation/Gait Ambulation/Gait assistance: Modified independent (Device/Increase time) Gait Distance (Feet): 300 Feet Assistive device: None Gait Pattern/deviations: Step-through pattern, Decreased stride length Gait velocity: decreased        Stairs Stairs: Yes Stairs assistance: Modified independent (Device/Increase time) Stair Management: One rail Right, Alternating pattern, Forwards Number of Stairs: 4    Wheelchair Mobility    Modified Rankin (Stroke Patients Only)       Balance Overall balance assessment: Mild deficits observed, not formally tested                                           Pertinent Vitals/Pain Pain Assessment Pain Assessment: Faces Faces Pain Scale: Hurts little more Pain Location: back - incision site Pain Descriptors / Indicators: Grimacing, Operative site guarding Pain Intervention(s): Monitored during session    Home Living Family/patient expects to be discharged to:: Private residence Living Arrangements: Spouse/significant other Available Help at Discharge: Family;Available 24 hours/day Type of Home: House Home Access: Stairs to enter Entrance Stairs-Rails: None Entrance Stairs-Number of Steps: 1 Alternate Level Stairs-Number of Steps: 3 steps to kitchen and bath; 2 steps to bedroom Home Layout: Multi-level;Bed/bath upstairs        Prior Function Prior Level of Function : Independent/Modified Independent  Hand Dominance        Extremity/Trunk Assessment   Upper Extremity Assessment Upper Extremity Assessment: Defer to OT evaluation    Lower Extremity Assessment Lower Extremity Assessment:  Generalized weakness    Cervical / Trunk Assessment Cervical / Trunk Assessment: Back Surgery  Communication   Communication: No difficulties  Cognition Arousal/Alertness: Awake/alert Behavior During Therapy: WFL for tasks assessed/performed Overall Cognitive Status: Within Functional Limits for tasks assessed                                          General Comments      Exercises     Assessment/Plan    PT Assessment Patient does not need any further PT services  PT Problem List         PT Treatment Interventions      PT Goals (Current goals can be found in the Care Plan section)  Acute Rehab PT Goals Patient Stated Goal: to go home PT Goal Formulation: All assessment and education complete, DC therapy    Frequency       Co-evaluation               AM-PAC PT "6 Clicks" Mobility  Outcome Measure Help needed turning from your back to your side while in a flat bed without using bedrails?: None Help needed moving from lying on your back to sitting on the side of a flat bed without using bedrails?: None Help needed moving to and from a bed to a chair (including a wheelchair)?: None Help needed standing up from a chair using your arms (e.g., wheelchair or bedside chair)?: None Help needed to walk in hospital room?: None Help needed climbing 3-5 steps with a railing? : None 6 Click Score: 24    End of Session Equipment Utilized During Treatment: Back brace Activity Tolerance: Patient tolerated treatment well Patient left: in bed;with call bell/phone within reach Nurse Communication: Mobility status PT Visit Diagnosis: Muscle weakness (generalized) (M62.81)    Time: 6237-6283 PT Time Calculation (min) (ACUTE ONLY): 11 min   Charges:   PT Evaluation $PT Eval Low Complexity: 1 Low         [name] A. Gilford Rile PT, DPT Acute Rehabilitation Services Office [phone]   Linna Hoff 09/10/2022, 9:24 AM

## 2022-09-10 NOTE — Evaluation (Signed)
Occupational Therapy Evaluation Patient Details Name: Daniel [name] MRN: [id number] DOB: Oct 11, 1956 Today's Date: 09/10/2022   History of Present Illness 66 y/o male s/p R sided L4-5 transforaminal interbody fusion and decompression with instrumentation and allograft on 09/09/22. PMH: CVA, BPH, COPD, HLD, HTN, renal stents, cervical fusion, and L rotator cuff repair.   Clinical Impression   PTA, pt lived with his wife and recently required frequent rest breaks between ADL due to RLE pain. Upon eval, pt reporting RLE pain resolved and minor low back pain consistent with diagnosis. Pt performing all UB ADL with set-up and LB ADL with supervision A for safety. Pt educated and demonstrating use of compensatory techniques for LB dressing, bed mobility, grooming, toileting, and tub shower transfer. Pt educated regarding movement recommendations to increase healing and decrease pain. All questions answered. Recommending discharge home with no OT follow up.      Recommendations for follow up therapy are one component of a multi-disciplinary discharge planning process, led by the attending physician.  Recommendations may be updated based on patient status, additional functional criteria and insurance authorization.   Follow Up Recommendations  No OT follow up    Assistance Recommended at Discharge Intermittent Supervision/Assistance  Patient can return home with the following A little help with walking and/or transfers;A little help with bathing/dressing/bathroom;Assist for transportation;Help with stairs or ramp for entrance    Functional Status Assessment  Patient has had a recent decline in their functional status and demonstrates the ability to make significant improvements in function in a reasonable and predictable amount of time.  Equipment Recommendations  None recommended by OT    Recommendations for Other Services       Precautions / Restrictions Precautions Precautions:  Back Precaution Booklet Issued: Yes (comment) Precaution Comments: All precautions reviewed within the context of ADL Required Braces or Orthoses: Spinal Brace Spinal Brace: Thoracolumbosacral orthotic;Applied in sitting position (May ambulate to bathroom and take a shower without brace) Restrictions Weight Bearing Restrictions: No      Mobility Bed Mobility Overal bed mobility: Needs Assistance Bed Mobility: Sit to Sidelying, Rolling Rolling: Supervision       Sit to sidelying: Supervision General bed mobility comments: Supervision for use of compensatory techniques. Pt EOB on arrival with MD    Transfers Overall transfer level: Needs assistance Equipment used: None Transfers: Sit to/from Stand Sit to Stand: Supervision           General transfer comment: Min guard A for safety      Balance Overall balance assessment: Mild deficits observed, not formally tested                                         ADL either performed or assessed with clinical judgement   ADL Overall ADL's : Needs assistance/impaired Eating/Feeding: Modified independent;Sitting   Grooming: Supervision/safety;Standing Grooming Details (indicate cue type and reason): Supervision for safety; educated regarding compensatory techniques Upper Body Bathing: Supervision/ safety;Standing   Lower Body Bathing: Supervison/ safety;Sit to/from stand Lower Body Bathing Details (indicate cue type and reason): educated regarding use of long handled sponge Upper Body Dressing : Moderate assistance;Standing Upper Body Dressing Details (indicate cue type and reason): Mod A to don brace. No assist to don shirt. Wife can help at home Lower Body Dressing: Sit to/from stand;Supervision/safety Lower Body Dressing Details (indicate cue type and reason): quick to rise Toilet Transfer: Min guard;Ambulation;Regular Toilet  Toileting - Clothing Manipulation Details (indicate cue type and reason):  educated regarding compensatory techniques for posterior pericare Tub/ Shower Transfer: Min guard;Ambulation;Tub transfer Tub/Shower Transfer Details (indicate cue type and reason): Pt demonstrating use of compensatory techniques Functional mobility during ADLs: Min guard General ADL Comments: Min guard A during functional mobility for safety. Mild balance deficits observed and walks briskly. Min cues to slow pace for increased control of balance.     Vision Baseline Vision/History: 0 No visual deficits Ability to See in Adequate Light: 0 Adequate Patient Visual Report: No change from baseline Vision Assessment?: No apparent visual deficits     Perception     Praxis      Pertinent Vitals/Pain Pain Assessment Pain Assessment: Faces Faces Pain Scale: Hurts a little bit Pain Location: operative site Pain Descriptors / Indicators: Operative site guarding Pain Intervention(s): Limited activity within patient's tolerance, Monitored during session     Hand Dominance Right   Extremity/Trunk Assessment Upper Extremity Assessment Upper Extremity Assessment: Defer to OT evaluation   Lower Extremity Assessment Lower Extremity Assessment: Generalized weakness   Cervical / Trunk Assessment Cervical / Trunk Assessment: Back Surgery   Communication Communication Communication: No difficulties   Cognition Arousal/Alertness: Awake/alert Behavior During Therapy: WFL for tasks assessed/performed Overall Cognitive Status: Within Functional Limits for tasks assessed                                 General Comments: Recalling all spinal precautions upon entry     General Comments  VSS    Exercises     Shoulder Instructions      Home Living Family/patient expects to be discharged to:: Private residence Living Arrangements: Spouse/significant other Available Help at Discharge: Family;Available 24 hours/day Type of Home: House Home Access: Stairs to enter [employer] of Steps: 1 Entrance Stairs-Rails: None Home Layout: Multi-level;Bed/bath upstairs Alternate Level Stairs-Number of Steps: 3 steps to kitchen and bath; 2 steps to bedroom Alternate Level Stairs-Rails: None Bathroom Shower/Tub: Teacher, early years/pre: Standard     Home Equipment: None (has access to tub bench, etc due to working at/living at [employer])          Prior Functioning/Environment Prior Level of Function : Independent/Modified Independent             Mobility Comments: no AD, but could only tolerate standing for ~2 minutes at a time most recently due to RLE pain ADLs Comments: Performing with mod I but was recently requiring frequent rest breaks with need to stretch RLE often        OT Problem List: Decreased strength;Pain;Decreased activity tolerance;Impaired balance (sitting and/or standing)      OT Treatment/Interventions:      OT Goals(Current goals can be found in the care plan section) Acute Rehab OT Goals Patient Stated Goal: go home OT Goal Formulation: With patient  OT Frequency:      Co-evaluation              AM-PAC OT "6 Clicks" Daily Activity     Outcome Measure Help from another person eating meals?: None Help from another person taking care of personal grooming?: A Little Help from another person toileting, which includes using toliet, bedpan, or urinal?: A Little Help from another person bathing (including washing, rinsing, drying)?: A Little Help from another person to put on and taking off regular upper body clothing?: A Little Help from another  person to put on and taking off regular lower body clothing?: A Little 6 Click Score: 19   End of Session Equipment Utilized During Treatment: Gait belt;Back brace Nurse Communication: Mobility status  Activity Tolerance: Patient tolerated treatment well Patient left: in bed;with call bell/phone within reach  OT Visit Diagnosis: Unsteadiness on feet  (R26.81);Muscle weakness (generalized) (M62.81);Other abnormalities of gait and mobility (R26.89);Pain Pain - part of body:  (operative site)                Time: 0800-0822 OT Time Calculation (min): 22 min Charges:  OT General Charges $OT Visit: 1 Visit OT Evaluation $OT Eval Low Complexity: 1 Low  Shanda Howells, OTR/L [facility] Acute Rehabilitation Office: [phone]   Lula Olszewski 09/10/2022, 9:29 AM

## 2022-09-10 NOTE — Progress Notes (Signed)
Patient alert and oriented x 4, pt void, surgical site clean and dry no sign of infection. VSS, d/c instructions explain and given to the patient all questions answered. Patient d/c home per order.

## 2022-09-10 NOTE — Progress Notes (Signed)
    Patient doing well  Patient denies leg pain + expected LBP   Physical Exam: Vitals:   09/10/22 0329 09/10/22 0737  BP: 111/73 103/71  Pulse: 64 67  Resp: 18 16  Temp: 98.7 F (37.1 C) 98.5 F (36.9 C)  SpO2: 98% 99%    Dressing in place NVI  POD #1 s/p L4/5 decompression and fusion, doing well  - up with PT/OT, encourage ambulation - Percocet for pain, Robaxin for muscle spasms - d/c home today with f/u in 2 weeks

## 2022-09-14 ENCOUNTER — Encounter (HOSPITAL_COMMUNITY): Payer: Self-pay | Admitting: Orthopedic Surgery

## 2022-09-16 NOTE — Discharge Summary (Signed)
Patient ID: Daniel [name] MRN: [id number] DOB/AGE: 1955/11/12 66 y.o.  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 male who was admitted 09/09/2022 for operative treatment of Radiculopathy, lumbar region. Patient has severe unremitting pain that affects sleep, daily activities, and work/hobbies. After pre-op clearance the patient was taken to the operating room on 09/09/2022 and underwent  Procedure(s): RIGHT-SIDED LUMBAR FOUR THROUGH LUMBAR FIVE TRANSFORAMINAL LUMBAR INTERBODY FUSION AND DECOMPRESSION WITH INSTRUMENTATION AND ALLOGRAFT.    Patient was given perioperative antibiotics:  Anti-infectives (From admission, onward)    Start     Dose/Rate Route Frequency Ordered Stop   09/09/22 1700  ceFAZolin (ANCEF) IVPB 2g/100 mL premix        2 g 200 mL/hr over 30 Minutes Intravenous Every 8 hours 09/09/22 1303 09/10/22 0813   09/09/22 0715  ceFAZolin (ANCEF) IVPB 2g/100 mL premix        2 g 200 mL/hr over 30 Minutes Intravenous On call to O.R. 09/09/22 0701 09/09/22 [id number]        Patient was given sequential compression devices, early ambulation to prevent DVT.  Patient benefited  maximally from hospital stay and there were no complications.    Recent vital signs: BP 103/71 (BP Location: Right Arm)   Pulse 67   Temp 98.5 F (36.9 C) (Oral)   Resp 16   Ht 5' 10.5" (1.791 m)   Wt 84.8 kg   SpO2 99%   BMI 26.45 kg/m    Discharge Medications:   Allergies as of 09/10/2022   No Known Allergies      Medication List     TAKE these medications    albuterol 108 (90 Base) MCG/ACT inhaler Commonly known as: VENTOLIN HFA Inhale 2 puffs into the lungs every 6 (six) hours as needed for wheezing or shortness of breath.   baclofen 10 MG tablet Commonly known as: LIORESAL Take 1 tablet (10 mg total) by mouth 3 (three) times daily.   clopidogrel 75 MG tablet Commonly known as: PLAVIX TAKE 1 TABLET(75 MG) BY MOUTH DAILY   diclofenac Sodium 1 % Gel Commonly known as: VOLTAREN Apply 1 Application topically 3 (three) times daily as needed (pain).   GLUCOSAMINE-CHONDROITIN PO Take 2 tablets by mouth daily. With Vit D   HYDROcodone-acetaminophen 5-325 MG tablet Commonly known as: NORCO/VICODIN Take 1 tablet by mouth every 6 (six) hours as needed for severe pain.   methocarbamol 500 MG tablet Commonly known as: Robaxin Take 1 tablet (500 mg total) by mouth every 6 (six) hours as needed. What changed: reasons to take this   methocarbamol 500 MG tablet Commonly known as: ROBAXIN Take 1 tablet (500 mg total) by mouth every 6 (six) hours as needed for muscle spasms.  What changed: You were already taking a medication with the same name, and this prescription was added. Make sure you understand how and when to take each.   metoprolol succinate 50 MG 24 hr tablet Commonly known as: TOPROL-XL TAKE 1 TABLET(50 MG) BY MOUTH DAILY WITH OR IMMEDIATELY FOLLOWING A MEAL   Oxycodone HCl 10 MG Tabs Take 10 mg by mouth daily as needed (Severe Pain).   oxyCODONE-acetaminophen 5-325 MG tablet Commonly known as: PERCOCET/ROXICET Take 1-2 tablets by mouth every 4 (four)  hours as needed for severe pain.   pantoprazole 40 MG tablet Commonly known as: PROTONIX Take 1 tablet (40 mg total) by mouth daily. For stomach   rosuvastatin 40 MG tablet Commonly known as: CRESTOR TAKE 1 TABLET(40 MG) BY MOUTH DAILY FOR CHOLESTEROL   SUPER B COMPLEX/C PO Take 1 tablet by mouth daily.   Trelegy Ellipta 100-62.5-25 MCG/ACT Aepb Generic drug: Fluticasone-Umeclidin-Vilant Inhale 1 puff into the lungs daily.        Diagnostic Studies: DG Lumbar Spine 2-3 Views  Result Date: 09/09/2022 CLINICAL DATA:  L4-5 decompression and fusion EXAM: LUMBAR SPINE - 2-3 VIEW COMPARISON:  Earlier same day FINDINGS: C-arm images show discectomy with interbody spacer at L4-5. Pedicle screws and posterior rods in place. No unexpected radiographic finding. IMPRESSION: Good appearance following L4-5 discectomy and fusion. Electronically Signed   By: Nelson Chimes M.D.   On: 09/09/2022 11:33   DG Lumbar Spine 1 View  Result Date: 09/09/2022 CLINICAL DATA:  L4-5 fusion. EXAM: LUMBAR SPINE - 1 VIEW COMPARISON:  MRI of August 09, 2022. FINDINGS: Single intraoperative cross-table lateral projection was obtained of the lumbar spine. This demonstrates surgical probes at approximately the L3 and L5 levels. IMPRESSION: Surgical localization as described above. Electronically Signed   By: Marijo Conception M.D.   On: 09/09/2022 11:33   DG C-Arm 1-60 Min-No Report  Result Date: 09/09/2022 Fluoroscopy was utilized by the requesting physician.  No radiographic interpretation.   DG C-Arm 1-60 Min-No Report  Result Date: 09/09/2022 Fluoroscopy was utilized by the requesting physician.  No radiographic interpretation.   DG C-Arm 1-60 Min-No Report  Result Date: 09/09/2022 Fluoroscopy was utilized by the requesting physician.  No radiographic interpretation.    Disposition: Discharge disposition: 01-Home or Self Care        POD #1 s/p L4/5 decompression and fusion, doing well   - up with  PT/OT, encourage ambulation - Percocet for pain, Robaxin for muscle spasms -Scripts for pain sent to pharmacy electronically  -D/C instructions sheet printed and in chart -D/C today  -F/U in office 2 weeks   Signed: Lennie Muckle Ignacia Gentzler 09/16/2022, 1:30 PM

## 2022-09-23 ENCOUNTER — Encounter (HOSPITAL_COMMUNITY): Payer: Self-pay | Admitting: Orthopedic Surgery
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
## 2022-10-07 ENCOUNTER — Encounter: Payer: Self-pay | Admitting: Adult Health

## 2022-10-07 ENCOUNTER — Ambulatory Visit: Payer: Medicare HMO | Admitting: Adult Health

## 2022-10-07 VITALS — BP 104/62 | HR 67 | Ht 70.5 in | Wt 188.0 lb

## 2022-10-07 DIAGNOSIS — R252 Cramp and spasm: Secondary | ICD-10-CM

## 2022-10-07 DIAGNOSIS — Z8673 Personal history of transient ischemic attack (TIA), and cerebral infarction without residual deficits: Secondary | ICD-10-CM | POA: Diagnosis not present

## 2022-10-07 MED ORDER — BACLOFEN 10 MG PO TABS: 10.0000 mg | ORAL_TABLET | Freq: Three times a day (TID) | ORAL | 3 refills | Status: DC

## 2022-10-07 NOTE — Patient Instructions (Addendum)
Your Plan:  Continue baclofen 10 mg 3 times daily as needed for muscle cramping/spasms  Continue current treatment plan for secondary stroke prevention measures and close follow-up with PCP Dr. Livia Snellen for aggressive stroke risk factor management    Follow-up in 1 year or call earlier if needed     Thank you for coming to see [place] at [facility]. I hope we have been able to provide you high quality care today.  You may receive a patient satisfaction survey over the next few weeks. We would appreciate your feedback and comments so that we may continue to improve ourselves and the health of our patients.

## 2022-10-10 ENCOUNTER — Other Ambulatory Visit: Payer: Self-pay | Admitting: Family Medicine

## 2022-10-19 DIAGNOSIS — M5416 Radiculopathy, lumbar region: Secondary | ICD-10-CM | POA: Diagnosis not present

## 2022-11-01 ENCOUNTER — Other Ambulatory Visit: Payer: Self-pay | Admitting: Family Medicine

## 2022-11-03 ENCOUNTER — Inpatient Hospital Stay (HOSPITAL_COMMUNITY): Payer: Medicare HMO

## 2022-11-03 ENCOUNTER — Inpatient Hospital Stay (HOSPITAL_BASED_OUTPATIENT_CLINIC_OR_DEPARTMENT_OTHER)
Admission: EM | Admit: 2022-11-03 | Discharge: 2022-11-06 | DRG: 603 | Disposition: A | Discharge status: Home / Self Care | Payer: Medicare HMO | Attending: Family Medicine | Admitting: Family Medicine

## 2022-11-03 ENCOUNTER — Other Ambulatory Visit: Payer: Self-pay

## 2022-11-03 ENCOUNTER — Ambulatory Visit: Payer: Medicare HMO | Admitting: Family Medicine

## 2022-11-03 ENCOUNTER — Encounter (HOSPITAL_BASED_OUTPATIENT_CLINIC_OR_DEPARTMENT_OTHER): Payer: Self-pay | Admitting: Emergency Medicine

## 2022-11-03 ENCOUNTER — Encounter (HOSPITAL_COMMUNITY): Payer: Self-pay

## 2022-11-03 DIAGNOSIS — I10 Essential (primary) hypertension: Secondary | ICD-10-CM | POA: Diagnosis not present

## 2022-11-03 DIAGNOSIS — N400 Enlarged prostate without lower urinary tract symptoms: Secondary | ICD-10-CM | POA: Diagnosis present

## 2022-11-03 DIAGNOSIS — Z8673 Personal history of transient ischemic attack (TIA), and cerebral infarction without residual deficits: Secondary | ICD-10-CM

## 2022-11-03 DIAGNOSIS — E785 Hyperlipidemia, unspecified: Secondary | ICD-10-CM | POA: Diagnosis not present

## 2022-11-03 DIAGNOSIS — R001 Bradycardia, unspecified: Secondary | ICD-10-CM | POA: Diagnosis not present

## 2022-11-03 DIAGNOSIS — A46 Erysipelas: Secondary | ICD-10-CM | POA: Diagnosis not present

## 2022-11-03 DIAGNOSIS — L409 Psoriasis, unspecified: Secondary | ICD-10-CM | POA: Diagnosis present

## 2022-11-03 DIAGNOSIS — Z87891 Personal history of nicotine dependence: Secondary | ICD-10-CM

## 2022-11-03 DIAGNOSIS — I701 Atherosclerosis of renal artery: Secondary | ICD-10-CM | POA: Diagnosis present

## 2022-11-03 DIAGNOSIS — J449 Chronic obstructive pulmonary disease, unspecified: Secondary | ICD-10-CM | POA: Diagnosis present

## 2022-11-03 DIAGNOSIS — K111 Hypertrophy of salivary gland: Secondary | ICD-10-CM | POA: Diagnosis not present

## 2022-11-03 DIAGNOSIS — Z79899 Other long term (current) drug therapy: Secondary | ICD-10-CM

## 2022-11-03 DIAGNOSIS — L03211 Cellulitis of face: Principal | ICD-10-CM

## 2022-11-03 DIAGNOSIS — Z7902 Long term (current) use of antithrombotics/antiplatelets: Secondary | ICD-10-CM | POA: Diagnosis not present

## 2022-11-03 DIAGNOSIS — Z836 Family history of other diseases of the respiratory system: Secondary | ICD-10-CM

## 2022-11-03 DIAGNOSIS — Z981 Arthrodesis status: Secondary | ICD-10-CM | POA: Diagnosis not present

## 2022-11-03 DIAGNOSIS — K219 Gastro-esophageal reflux disease without esophagitis: Secondary | ICD-10-CM | POA: Diagnosis present

## 2022-11-03 DIAGNOSIS — M948X9 Other specified disorders of cartilage, unspecified sites: Secondary | ICD-10-CM

## 2022-11-03 DIAGNOSIS — I6523 Occlusion and stenosis of bilateral carotid arteries: Secondary | ICD-10-CM | POA: Diagnosis not present

## 2022-11-03 DIAGNOSIS — H61012 Acute perichondritis of left external ear: Secondary | ICD-10-CM | POA: Diagnosis not present

## 2022-11-03 LAB — CBC WITH DIFFERENTIAL/PLATELET
Abs Immature Granulocytes: 0.01 10*3/uL (ref 0.00–0.07)
Basophils Absolute: 0 10*3/uL (ref 0.0–0.1)
Basophils Relative: 1 %
Eosinophils Absolute: 0.2 10*3/uL (ref 0.0–0.5)
Eosinophils Relative: 4 %
HCT: 40.6 % (ref 39.0–52.0)
Hemoglobin: 13.7 g/dL (ref 13.0–17.0)
Immature Granulocytes: 0 %
Lymphocytes Relative: 22 %
Lymphs Abs: 1.2 10*3/uL (ref 0.7–4.0)
MCH: 32.4 pg (ref 26.0–34.0)
MCHC: 33.7 g/dL (ref 30.0–36.0)
MCV: 96 fL (ref 80.0–100.0)
Monocytes Absolute: 0.6 10*3/uL (ref 0.1–1.0)
Monocytes Relative: 11 %
Neutro Abs: 3.4 10*3/uL (ref 1.7–7.7)
Neutrophils Relative %: 62 %
Platelets: 159 10*3/uL (ref 150–400)
RBC: 4.23 MIL/uL (ref 4.22–5.81)
RDW: 13.7 % (ref 11.5–15.5)
WBC: 5.4 10*3/uL (ref 4.0–10.5)
nRBC: 0 % (ref 0.0–0.2)

## 2022-11-03 LAB — BASIC METABOLIC PANEL
Anion gap: 9 (ref 5–15)
BUN: 11 mg/dL (ref 8–23)
CO2: 24 mmol/L (ref 22–32)
Calcium: 9.3 mg/dL (ref 8.9–10.3)
Chloride: 106 mmol/L (ref 98–111)
Creatinine, Ser: 0.86 mg/dL (ref 0.61–1.24)
GFR, Estimated: >60 mL/min (ref >60)
Glucose, Bld: 102 mg/dL — ABNORMAL HIGH (ref 70–99)
Potassium: 3.8 mmol/L (ref 3.5–5.1)
Sodium: 139 mmol/L (ref 135–145)

## 2022-11-03 MED ORDER — UMECLIDINIUM BROMIDE 62.5 MCG/ACT IN AEPB
1.0000 | INHALATION_SPRAY | Freq: Every day | RESPIRATORY_TRACT | Status: DC
  Administered 2022-11-04 – 2022-11-06 (×3): 1 via RESPIRATORY_TRACT
  Filled 2022-11-03: qty 7

## 2022-11-03 MED ORDER — LEVOFLOXACIN IN D5W 750 MG/150ML IV SOLN
750.0000 mg | Freq: Once | INTRAVENOUS | Status: AC
  Administered 2022-11-03: 750 mg via INTRAVENOUS
  Filled 2022-11-03: qty 150

## 2022-11-03 MED ORDER — VANCOMYCIN HCL 1750 MG/350ML IV SOLN
1750.0000 mg | INTRAVENOUS | Status: AC
  Administered 2022-11-03: 1750 mg via INTRAVENOUS
  Filled 2022-11-03: qty 350

## 2022-11-03 MED ORDER — IOHEXOL 300 MG/ML  SOLN
80.0000 mL | Freq: Once | INTRAMUSCULAR | Status: AC | PRN
  Administered 2022-11-03: 80 mL via INTRAVENOUS

## 2022-11-03 MED ORDER — METOPROLOL SUCCINATE ER 50 MG PO TB24
50.0000 mg | ORAL_TABLET | Freq: Every day | ORAL | Status: DC
  Administered 2022-11-03 – 2022-11-06 (×3): 50 mg via ORAL
  Filled 2022-11-03 (×4): qty 1

## 2022-11-03 MED ORDER — VANCOMYCIN HCL IN DEXTROSE 1-5 GM/200ML-% IV SOLN: 1000.0000 mg | Freq: Once | INTRAVENOUS | Status: DC

## 2022-11-03 MED ORDER — SODIUM CHLORIDE 0.9 % IV SOLN
2.0000 g | Freq: Three times a day (TID) | INTRAVENOUS | Status: DC
  Administered 2022-11-04: 2 g via INTRAVENOUS
  Filled 2022-11-03: qty 12.5

## 2022-11-03 MED ORDER — CLOPIDOGREL BISULFATE 75 MG PO TABS
75.0000 mg | ORAL_TABLET | Freq: Every day | ORAL | Status: DC
  Administered 2022-11-03 – 2022-11-06 (×4): 75 mg via ORAL
  Filled 2022-11-03 (×4): qty 1

## 2022-11-03 MED ORDER — ROSUVASTATIN CALCIUM 10 MG PO TABS
40.0000 mg | ORAL_TABLET | Freq: Every day | ORAL | Status: DC
  Administered 2022-11-03 – 2022-11-05 (×3): 40 mg via ORAL
  Filled 2022-11-03 (×3): qty 4

## 2022-11-03 MED ORDER — METHOCARBAMOL 500 MG PO TABS
500.0000 mg | ORAL_TABLET | Freq: Four times a day (QID) | ORAL | Status: DC | PRN
  Administered 2022-11-03 – 2022-11-06 (×7): 500 mg via ORAL
  Filled 2022-11-03 (×7): qty 1

## 2022-11-03 MED ORDER — METHOCARBAMOL 500 MG PO TABS
500.0000 mg | ORAL_TABLET | Freq: Three times a day (TID) | ORAL | Status: DC
  Administered 2022-11-03 (×2): 500 mg via ORAL
  Filled 2022-11-03 (×2): qty 1

## 2022-11-03 MED ORDER — VANCOMYCIN HCL IN DEXTROSE 1-5 GM/200ML-% IV SOLN
1000.0000 mg | Freq: Two times a day (BID) | INTRAVENOUS | Status: DC
  Administered 2022-11-04: 1000 mg via INTRAVENOUS
  Filled 2022-11-03: qty 200

## 2022-11-03 MED ORDER — MUPIROCIN CALCIUM 2 % EX CREA
TOPICAL_CREAM | Freq: Two times a day (BID) | CUTANEOUS | Status: DC
  Filled 2022-11-03: qty 15

## 2022-11-03 MED ORDER — FLUTICASONE FUROATE-VILANTEROL 100-25 MCG/ACT IN AEPB
1.0000 | INHALATION_SPRAY | Freq: Every day | RESPIRATORY_TRACT | Status: DC
  Administered 2022-11-04 – 2022-11-06 (×3): 1 via RESPIRATORY_TRACT
  Filled 2022-11-03: qty 28

## 2022-11-03 MED ORDER — BACLOFEN 10 MG PO TABS
10.0000 mg | ORAL_TABLET | Freq: Three times a day (TID) | ORAL | Status: DC
  Administered 2022-11-03 – 2022-11-06 (×8): 10 mg via ORAL
  Filled 2022-11-03 (×8): qty 1

## 2022-11-03 MED ORDER — SODIUM CHLORIDE 0.9 % IV SOLN
INTRAVENOUS | Status: DC | PRN
  Administered 2022-11-03: 10 mL via INTRAVENOUS

## 2022-11-03 MED ORDER — ENOXAPARIN SODIUM 40 MG/0.4ML IJ SOSY
40.0000 mg | PREFILLED_SYRINGE | INTRAMUSCULAR | Status: DC
  Administered 2022-11-03 – 2022-11-05 (×3): 40 mg via SUBCUTANEOUS
  Filled 2022-11-03 (×3): qty 0.4

## 2022-11-03 MED ORDER — SODIUM CHLORIDE 0.9 % IV SOLN
2.0000 g | Freq: Once | INTRAVENOUS | Status: AC
  Administered 2022-11-03: 2 g via INTRAVENOUS
  Filled 2022-11-03: qty 12.5

## 2022-11-03 NOTE — ED Notes (Signed)
Called [facility] and spoke to Parrish; informed that the patient Bed Assignment is ready

## 2022-11-03 NOTE — Assessment & Plan Note (Signed)
-  normotensive. Continue home metoprolol.

## 2022-11-03 NOTE — Assessment & Plan Note (Signed)
continue statin

## 2022-11-03 NOTE — Assessment & Plan Note (Signed)
-  stable. Continue home bronchodilator.

## 2022-11-03 NOTE — ED Provider Notes (Signed)
[facility] EMERGENCY DEPT Provider Note   CSN: [id number] Arrival date & time: 11/03/22  8250     History  Chief Complaint  Patient presents with   Facial Swelling    Daniel [name] is a 67 y.o. male.  Patient is a 67 year old male with a history of COPD, hypertension, stroke, psoriasis who is presenting today with redness and swelling of the ear and face.  Patient reports it started 30 hours ago initially woke him up with pain and weeping of the left ear.  It gradually worsened throughout the day yesterday he started noticing some redness on his face.  However he woke up this morning with swelling on both sides of his face, redness that extends all the way down to his neck.  He is not having significant itching but reports it does hurt.  He has not noticed having a fever.  He does report that he has quite a bit of psoriasis in his scalp and beard so always scratches some but is never had anything like this before.  Does not recall being bitten by anything and has not used any new creams or lotions.  He denies any difficulty swallowing or breathing.  No swelling inside of the mouth.  The history is provided by the patient and the spouse.       Home Medications Prior to Admission medications   Medication Sig Start Date End Date Taking? Authorizing Provider  albuterol (VENTOLIN HFA) 108 (90 Base) MCG/ACT inhaler Inhale 2 puffs into the lungs every 6 (six) hours as needed for wheezing or shortness of breath.    [provider]  baclofen (LIORESAL) 10 MG tablet Take 1 tablet (10 mg total) by mouth 3 (three) times daily. 10/07/22   Frann Rider, NP  clopidogrel (PLAVIX) 75 MG tablet TAKE 1 TABLET(75 MG) BY MOUTH DAILY 03/03/22   [name], MD  Fluticasone-Umeclidin-Vilant (TRELEGY ELLIPTA) 100-62.5-25 MCG/ACT AEPB Inhale 1 puff into the lungs daily. 03/03/22   [name], MD  GLUCOSAMINE-CHONDROITIN PO Take 2 tablets by mouth daily. With Vit D    [provider]  HYDROcodone-acetaminophen (NORCO/VICODIN) 5-325 MG tablet Take 1 tablet by mouth every 6 (six) hours as needed for severe pain. 08/17/22   [provider]  methocarbamol (ROBAXIN) 500 MG tablet Take 1 tablet (500 mg total) by mouth every 6 (six) hours as needed for muscle spasms. 09/09/22   Phylliss Bob, MD  metoprolol succinate (TOPROL-XL) 50 MG 24 hr tablet TAKE 1 TABLET(50 MG) BY MOUTH DAILY WITH OR IMMEDIATELY FOLLOWING A MEAL 10/09/21   [name], MD  pantoprazole (PROTONIX) 40 MG tablet TAKE 1 TABLET(40 MG) BY MOUTH DAILY FOR STOMACH 10/11/22   [name], MD  rosuvastatin (CRESTOR) 40 MG tablet TAKE 1 TABLET(40 MG) BY MOUTH DAILY FOR CHOLESTEROL 11/03/22   [name], MD  SUPER B COMPLEX/C PO Take 1 tablet by mouth daily.    [provider]      Allergies    Patient has no known allergies.    Review of Systems   Review of Systems  Physical Exam Updated Vital Signs BP 107/72   Pulse (!) 53   Temp 98.2 F (36.8 C)   Resp 12   Ht 5' 10.5" (1.791 m)   Wt 87.1 kg   SpO2 94%   BMI 27.16 kg/m  Physical Exam Vitals and nursing note reviewed.  Constitutional:      General: He is not in acute distress.    Appearance: He  is well-developed.  HENT:     Head: Normocephalic and atraumatic.     Left Ear: Drainage, swelling and tenderness present.     Ears:     Comments: Significant swelling of the left ear involving all the external structures of the ear.  Weeping and crusting noted behind the ear.  No mastoid tenderness or swelling.  Erythema and warmth noted over the ear and also extending over bilateral cheeks, lower face down onto the neck Eyes:     Extraocular Movements: Extraocular movements intact.     Conjunctiva/sclera: Conjunctivae normal.     Pupils: Pupils are equal, round, and reactive to light.  Cardiovascular:     Rate and Rhythm: Normal rate and regular rhythm.     Heart sounds: No murmur heard. Pulmonary:     Effort:  Pulmonary effort is normal. No respiratory distress.     Breath sounds: Normal breath sounds. No wheezing or rales.  Abdominal:     General: There is no distension.     Palpations: Abdomen is soft.     Tenderness: There is no abdominal tenderness. There is no guarding or rebound.  Musculoskeletal:        General: No tenderness. Normal range of motion.     Cervical back: Normal range of motion and neck supple.  Skin:    General: Skin is warm and dry.     Findings: No erythema or rash.  Neurological:     Mental Status: He is alert and oriented to person, place, and time.  Psychiatric:        Behavior: Behavior normal.          ED Results / Procedures / Treatments   Labs (all labs ordered are listed, but only abnormal results are displayed) Labs Reviewed  BASIC METABOLIC PANEL - Abnormal; Notable for the following components:      Result Value   Glucose, Bld 102 (*)    All other components within normal limits  CBC WITH DIFFERENTIAL/PLATELET    EKG None  Radiology No results found.  Procedures Procedures    Medications Ordered in ED Medications  levofloxacin (LEVAQUIN) IVPB 750 mg (750 mg Intravenous New Bag/Given 11/03/22 1151)  0.9 %  sodium chloride infusion (10 mLs Intravenous New Bag/Given 11/03/22 1148)  methocarbamol (ROBAXIN) tablet 500 mg (has no administration in time range)    ED Course/ Medical Decision Making/ A&P                           Medical Decision Making Amount and/or Complexity of Data Reviewed Labs: ordered. Decision-making details documented in ED Course.  Risk Prescription drug management. Decision regarding hospitalization.   Pt with multiple medical problems and comorbidities and presenting today with a complaint that caries a high risk for morbidity and mortality.  Here today with significant erythema and swelling noted of the left ear and the face and neck.  Concern for perichondritis with overlying cellulitis.  Low suspicion at  this time for orbital cellulitis.  All of patient's symptoms started with his ear.  Does not appear to be a classic allergic reaction.  It is painful but not itchy.  Given patient's extent of his symptoms pictures are attached will start IV antibiotics and admit to ensure symptoms are improving.  Discussed with pharmacy and will start Levaquin at this time to cover for Pseudomonas and staph.  He does not have a prior history of MRSA.  12:13 PM I  independently interpreted patient's labs and CBC and BMP are within normal limits.  I discussed the case with the hospitalist and patient will be admitted for further care.  Patient does report he still drinks regularly but he does not feel that he is ever withdrawal.  He reports last week he went several days without any alcohol and felt fine.  Just discussed with the patient to let us know if he starts feeling shaky and his nurse is aware.         Final Clinical Impression(s) / ED Diagnoses Final diagnoses:  Facial cellulitis  Perichondritis    Rx / DC Orders ED Discharge Orders     None         Blanchie Dessert, MD 11/03/22 1214

## 2022-11-03 NOTE — ED Triage Notes (Signed)
Pt arrives to ED with c/o facial swelling that started yesterday.

## 2022-11-03 NOTE — H&P (Signed)
History and Physical    Patient: Daniel [name] QIH:[id number] DOB: [birth year] DOA: 11/03/2022 DOS: the patient was seen and examined on 11/03/2022 PCP: [name], MD  Patient coming from:  [place] ED  Chief Complaint:  Chief Complaint  Patient presents with   Facial Swelling   HPI: Daniel [name] is a 67 y.o. male with medical history significant of CVA, renal artery stenosis s/p stent, HTN, COPD, GERD, BPH, alcohol abuse who presents with facial cellulitis.   Earlier yesterday morning he was scratching behind his left ear and felt something started to drain. Could not see what it was since he was in bed in the dark. Then throughout the day left ear progressively became more red and swallowing and spread across to his face. This morning when he woke up he could barely open his left eye and redness had spread to the right facial region. Also spreading down his left neck with increase edema. He denies any difficulty breathing or swallowing. No changes in hearing or vision. No fever or chills. He had notable superficial skin tear with yellow crusting behind his left ear.    In the ED, he was afebrile, bradycardic with HR in high 50s, normotensive on room air.   No leukocytosis, anemia. BMP unremarkable.   He was started on Levaquin in ED and transfer here for further management.  Review of Systems: As mentioned in the history of present illness. All other systems reviewed and are negative. 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   BPH (benign prostatic hyperplasia)    COPD (chronic obstructive pulmonary disease) (HCC)    Hyperlipidemia    Hypertension    Pulmonary nodule    renal stents    Stroke [facility])    TIA (transient ischemic attack) 03/11/2018   Past Surgical History:  Procedure Laterality Date   CERVICAL FUSION      PTERYGIUM EXCISION  2020   x2   renal stents     ROTATOR CUFF REPAIR Left 12/2021   TRANSFORAMINAL LUMBAR INTERBODY FUSION (TLIF) WITH PEDICLE SCREW FIXATION 1 LEVEL Right 09/09/2022   Procedure: RIGHT-SIDED LUMBAR FOUR THROUGH LUMBAR FIVE TRANSFORAMINAL LUMBAR INTERBODY FUSION AND DECOMPRESSION WITH INSTRUMENTATION AND ALLOGRAFT;  Surgeon: Phylliss Bob, MD;  Location: [facility];  Service: Orthopedics;  Laterality: Right;   Social History:  reports that he has quit smoking. His smoking use included cigarettes. He has a 42.00 pack-year smoking history. He has never used smokeless tobacco. He reports current alcohol use of about 21.0 standard drinks of alcohol per week. He reports that he does not use drugs.  No Known Allergies  Family History  Problem Relation Age of Onset   Emphysema Father    Cancer Father    Lung cancer Mother     Prior to Admission medications   Medication Sig Start Date End Date Taking? Authorizing Provider  albuterol (VENTOLIN HFA) 108 (90 Base) MCG/ACT inhaler Inhale 2 puffs into the lungs every 6 (six) hours as needed for wheezing or shortness of breath.   Yes [provider]  baclofen (LIORESAL) 10 MG tablet Take 1 tablet (10 mg total) by mouth 3 (three) times daily. 10/07/22  Yes McCue, Janett Billow, NP  clopidogrel (PLAVIX) 75 MG tablet TAKE 1 TABLET(75 MG) BY MOUTH DAILY 03/03/22  Yes Stacks, Cletus Gash, MD  Fluticasone-Umeclidin-Vilant (TRELEGY ELLIPTA) 100-62.5-25 MCG/ACT AEPB Inhale 1 puff into the  lungs daily. 03/03/22  Yes [name], MD  GLUCOSAMINE-CHONDROITIN PO Take 2 tablets by mouth daily. With Vit D   Yes [provider]  HYDROcodone-acetaminophen (NORCO/VICODIN) 5-325 MG tablet Take 1 tablet by mouth every 6 (six) hours as needed for severe pain. 08/17/22  Yes [provider]  methocarbamol (ROBAXIN) 500 MG tablet Take 1 tablet (500 mg total) by mouth every 6 (six) hours as needed for muscle spasms. 09/09/22  Yes Phylliss Bob, MD   metoprolol succinate (TOPROL-XL) 50 MG 24 hr tablet TAKE 1 TABLET(50 MG) BY MOUTH DAILY WITH OR IMMEDIATELY FOLLOWING A MEAL 10/09/21  Yes Stacks, Cletus Gash, MD  pantoprazole (PROTONIX) 40 MG tablet TAKE 1 TABLET(40 MG) BY MOUTH DAILY FOR STOMACH 10/11/22  Yes Stacks, Cletus Gash, MD  rosuvastatin (CRESTOR) 40 MG tablet TAKE 1 TABLET(40 MG) BY MOUTH DAILY FOR CHOLESTEROL 11/03/22  Yes Stacks, Cletus Gash, MD  SUPER B COMPLEX/C PO Take 1 tablet by mouth daily.   Yes [provider]    Physical Exam: Vitals:   11/03/22 1342 11/03/22 1630 11/03/22 1746 11/03/22 1825  BP:  110/72  113/68  Pulse:  77  73  Resp:  16  13  Temp: 98.4 F (36.9 C)  98.7 F (37.1 C) 98.2 F (36.8 C)  TempSrc: Oral  Oral Oral  SpO2:  96%  99%  Weight:      Height:       Constitutional: NAD, calm, comfortable, elderly male laying comfortably in bed Eyes: lids and conjunctivae normal ENMT: Mucous membranes are moist.  Crackly dry superficial skin tear with yellow crusting to posterior left ear where his glasses normally sit.  Left ear edema and erythema spreading down to anterior left and right cheek/maxillary region.  Spreading erythema down to left anterior neck.         Neck: normal, supple, left anterior cervical region more edematous especially around the left supra clavicular region. Respiratory: clear to auscultation bilaterally, no wheezing, no crackles. Normal respiratory effort. No accessory muscle use.  Cardiovascular: Regular rate and rhythm, no murmurs / rubs / gallops. No extremity edema.  Abdomen: no tenderness,  Bowel sounds positive.  Musculoskeletal: no clubbing / cyanosis. No joint deformity upper and lower extremities. Good ROM, no contractures. Normal muscle tone.  Skin: no rashes, lesions, ulcers. No induration Neurologic: CN 2-12 grossly intact.  Strength 5/5 in all 4.  Psychiatric: Normal judgment and insight. Alert and oriented x 3. Normal mood.  Telling jokes. Data Reviewed:  See HPI    Assessment and Plan: * Cellulitis of face -rapidly spreading cellulitis from skin tear seen on his posterior ear to across his face and down left anterior cervical region. No respiratory difficulty at this time but left anterior cervical does appear more edematous. Will obtain stat CT soft tissue to rule out any deep tissue infection.  -He was administered Levaquin in ED. Will switch to vancomycin for MRSA coverage and add cefepime for more gram negative and pseudomonal coverage -mupirocin ointment for superficial skin tear on posterior left ear  -continue to monitor clinically  Sinus bradycardia -asymptomatic. Chronically on metoprolol succinate.   Hypertension -normotensive. Continue home metoprolol.  Hyperlipidemia -continue statin  COPD (chronic obstructive pulmonary disease) (HCC) -stable. Continue home bronchodilator.      Advance Care Planning: Full  Consults: none  Family Communication: wife and sister at bedside  Severity of Illness: The appropriate patient status for this patient is INPATIENT. Inpatient status is judged to be reasonable and necessary in order to  provide the required intensity of service to ensure the patient's safety. The patient's presenting symptoms, physical exam findings, and initial radiographic and laboratory data in the context of their chronic comorbidities is felt to place them at high risk for further clinical deterioration. Furthermore, it is not anticipated that the patient will be medically stable for discharge from the hospital within 2 midnights of admission.   * I certify that at the point of admission it is my clinical judgment that the patient will require inpatient hospital care spanning beyond 2 midnights from the point of admission due to high intensity of service, high risk for further deterioration and high frequency of surveillance required.*  Author: Orene Desanctis, DO 11/03/2022 7:40 PM  For on call review [email or web address].

## 2022-11-03 NOTE — Progress Notes (Signed)
Pt arrived on floor. Pt ambulated to bed. VS taken, telemetry put on and paged admitting to let them know pt is on the floor. Pt made comfortable and oriented to the unit.

## 2022-11-03 NOTE — Progress Notes (Signed)
Pharmacy Antibiotic Note  Daniel [name] is a 67 y.o. male admitted on 11/03/2022 with facial and ear swelling/cellulitis.  Pharmacy has been consulted for vancomycin and cefepime dosing.  Today, 11/03/22 WBC, SCr both WNL Afebrile  Plan: Cefepime 2 g IV q8h Vancomycin 1750 mg loading dose followed by 1000 mg IV q12h for estimated AUC of 415 Goal vancomycin AUC 400-550. Check levels at steady state as needed Monitor renal function, culture data  Height: 5' 10.5" (179.1 cm) Weight: 87.1 kg (192 lb) IBW/kg (Calculated) : 74.15  Temp (24hrs), Avg:98.4 F (36.9 C), Min:98.2 F (36.8 C), Max:98.7 F (37.1 C)  Recent Labs  Lab 11/03/22 1015  WBC 5.4  CREATININE 0.86    Estimated Creatinine Clearance: 88.7 mL/min (by C-G formula based on SCr of 0.86 mg/dL).    No Known Allergies  Antimicrobials this admission: Levofloxacin x1 1/2 cefepime 1/2 >>  Vancomycin 1/2 >>  Dose adjustments this admission:  Microbiology results: None  Lenis Noon, PharmD 11/03/2022 7:37 PM

## 2022-11-03 NOTE — Assessment & Plan Note (Signed)
-  rapidly spreading cellulitis from skin tear seen on his posterior ear to across his face and down left anterior cervical region. No respiratory difficulty at this time but left anterior cervical does appear more edematous. Will obtain stat CT soft tissue to rule out any deep tissue infection.  -He was administered Levaquin in ED. Will switch to vancomycin for MRSA coverage and add cefepime for more gram negative and pseudomonal coverage -mupirocin ointment for superficial skin tear on posterior left ear  -continue to monitor clinically

## 2022-11-03 NOTE — ED Notes (Signed)
Handoff report given to [facility] 

## 2022-11-03 NOTE — Assessment & Plan Note (Signed)
-  asymptomatic. Chronically on metoprolol succinate.

## 2022-11-03 NOTE — Progress Notes (Signed)
Plan of Care Note for accepted transfer   Patient: Daniel [name] MRN: [id number]   DOA: 11/03/2022  Facility requesting transfer: [facility]. Requesting Provider: Blanchie Dessert, MD. Reason for transfer: Chondritis of the left ear with facial cellulitis. Facility course:  Per ED provider " Chief Complaint  Patient presents with   Facial Swelling    Daniel [name] is a 67 y.o. male.   Patient is a 67 year old male with a history of COPD, hypertension, stroke, psoriasis who is presenting today with redness and swelling of the ear and face.  Patient reports it started 30 hours ago initially woke him up with pain and weeping of the left ear.  It gradually worsened throughout the day yesterday he started noticing some redness on his face.  However he woke up this morning with swelling on both sides of his face, redness that extends all the way down to his neck.  He is not having significant itching but reports it does hurt.  He has not noticed having a fever.  He does report that he has quite a bit of psoriasis in his scalp and beard so always scratches some but is never had anything like this before.  Does not recall being bitten by anything and has not used any new creams or lotions.  He denies any difficulty swallowing or breathing.  No swelling inside of the mouth."  Lab work: Basic metabolic panel [[id number]] (Abnormal)   Collected: 11/03/22 1015   Updated: 11/03/22 1059   Specimen Type: Blood   Specimen Source: Vein    Sodium 139 mmol/L   Potassium 3.8 mmol/L   Chloride 106 mmol/L   CO2 24 mmol/L   Glucose, Bld 102 High  mg/dL   BUN 11 mg/dL   Creatinine, Ser 0.86 mg/dL   Calcium 9.3 mg/dL   GFR, Estimated >60 mL/min   Anion gap 9  CBC with Differential/Platelet [[id number]]   Collected: 11/03/22 1015   Updated: 11/03/22 1025   Specimen Type: Blood   Specimen Source: Vein    WBC 5.4 K/uL   RBC 4.23 MIL/uL   Hemoglobin 13.7 g/dL   HCT 40.6 %   MCV 96.0 fL   MCH 32.4 pg    MCHC 33.7 g/dL   RDW 13.7 %   Platelets 159 K/uL   nRBC 0.0 %   Neutrophils Relative % 62 %   Neutro Abs 3.4 K/uL   Lymphocytes Relative 22 %   Lymphs Abs 1.2 K/uL   Monocytes Relative 11 %   Monocytes Absolute 0.6 K/uL   Eosinophils Relative 4 %   Eosinophils Absolute 0.2 K/uL   Basophils Relative 1 %   Basophils Absolute 0.0 K/uL   Immature Granulocytes 0 %   Abs Immature Granulocytes 0.01 K/uL   Plan of care: The patient is accepted for admission to Telemetry unit, at [facility].  He received levofloxacin per pharmacy recommendation to cover MRSA and Pseudomonas.  Has sinus bradycardia likely from beta-blocker use.  Also chart stated that he drinks 3 ounces of liquid or daily.  Monitor for withdrawal symptoms.  Author: Reubin Milan, MD 11/03/2022  Check [email or web address] for on-call coverage.  Nursing staff, Please call [place] number on Amion as soon as patient's arrival, so appropriate admitting provider can evaluate the pt.

## 2022-11-04 DIAGNOSIS — J449 Chronic obstructive pulmonary disease, unspecified: Secondary | ICD-10-CM | POA: Diagnosis not present

## 2022-11-04 DIAGNOSIS — L03211 Cellulitis of face: Secondary | ICD-10-CM | POA: Diagnosis not present

## 2022-11-04 DIAGNOSIS — R001 Bradycardia, unspecified: Secondary | ICD-10-CM | POA: Diagnosis not present

## 2022-11-04 LAB — CREATININE, SERUM
Creatinine, Ser: 0.98 mg/dL (ref 0.61–1.24)
GFR, Estimated: >60 mL/min (ref >60)

## 2022-11-04 LAB — CBC
HCT: 39.4 % (ref 39.0–52.0)
Hemoglobin: 13.1 g/dL (ref 13.0–17.0)
MCH: 32.3 pg (ref 26.0–34.0)
MCHC: 33.2 g/dL (ref 30.0–36.0)
MCV: 97.3 fL (ref 80.0–100.0)
Platelets: 154 10*3/uL (ref 150–400)
RBC: 4.05 MIL/uL — ABNORMAL LOW (ref 4.22–5.81)
RDW: 13.7 % (ref 11.5–15.5)
WBC: 6.3 10*3/uL (ref 4.0–10.5)
nRBC: 0 % (ref 0.0–0.2)

## 2022-11-04 MED ORDER — CEFAZOLIN SODIUM-DEXTROSE 2-4 GM/100ML-% IV SOLN
2.0000 g | Freq: Three times a day (TID) | INTRAVENOUS | Status: DC
  Administered 2022-11-04 – 2022-11-06 (×5): 2 g via INTRAVENOUS
  Filled 2022-11-04 (×5): qty 100

## 2022-11-04 MED ORDER — PANTOPRAZOLE SODIUM 40 MG PO TBEC
40.0000 mg | DELAYED_RELEASE_TABLET | Freq: Every day | ORAL | Status: DC
  Administered 2022-11-04 – 2022-11-06 (×3): 40 mg via ORAL
  Filled 2022-11-04 (×3): qty 1

## 2022-11-04 NOTE — Hospital Course (Addendum)
67 year old man presented with rapidly spreading facial infection.  Began after scratching the skin behind his left ear.  Admitted for IV antibiotics.

## 2022-11-04 NOTE — Consult Note (Signed)
[facility] for Infectious Disease    Date of Admission:  11/03/2022     Reason for Consult: facial cellulitis/erysipelas    Referring Provider: Murray Hodgkins     Abx: 1/02-c vanc 1/02-c cefepime        Assessment: Facial cellulitis  Psoriasis Cspine and lspine hardware  Patient already has improvement since yesterday He scratched his left ear and cellulitis started from break down of skin between the ear lobe/scalp  Clinically appears to be a streptococcal process and agree with primary team looks like erysipelas  Imaging showed no abscess of neck/face  Neck spine hardware area doesn't seem involved  Plan: Change antibiotics to monotherapy with cefazolin Duration of abx anticipated 7-10 days Will transition to oral abx if clinical improvement is sustained for the next 2 days Discussed with primary team   I spent 75 minute reviewing data/chart, and coordinating care and >50% direct face to face time providing counseling/discussing diagnostics/treatment plan with patient      ------------------------------------------------ Principal Problem:   Cellulitis of face Active Problems:   COPD (chronic obstructive pulmonary disease) (HCC)   Hyperlipidemia   Hypertension   Sinus bradycardia    HPI: Daniel [name] is a 67 y.o. male hx psoriasis admitted with facial cellulitis  He doesn't have known dm2  He has controlled psoriasis on topical medication  He was scratching his left ear 1 day ago and suspected some skin maceration/breakdown at the ear/scalp area  He noted clear drainage in that area but quick swelling/pain/redness development within several hours on left ear/face that had quickly spread to involve right face  The redness had spreaded to submandibular/neck area  He has distant hx cspine surgery /hardware and recently 3 months ago lumbar area  He has no pain in those back area  No denies subjective f/c  No hx lupus or  similar facial rash No other sx  Afebrile on presentation Stable hemodynamics No leukocytosis  Started on vanc/cefepime  Improved swelling of left face and can open eyes   Family History  Problem Relation Age of Onset   Emphysema Father    Cancer Father    Lung cancer Mother     Social History   Tobacco Use   Smoking status: Former    Packs/day: 1.00    Years: 42.00    Total pack years: 42.00    Types: Cigarettes   Smokeless tobacco: Never  Vaping Use   Vaping Use: Never used  Substance Use Topics   Alcohol use: Yes    Alcohol/week: 21.0 standard drinks of alcohol    Types: 21 Shots of liquor per week    Comment: 3-3oz shots of bourbon a day   Drug use: Never    No Known Allergies  Review of Systems: ROS All Other ROS was negative, except mentioned above   Past Medical History:  Diagnosis Date   Acute ischemic stroke ([place]) 03/12/2018   Anesthesia complication    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   BPH (benign prostatic hyperplasia)    COPD (chronic obstructive pulmonary disease) (HCC)    Hyperlipidemia    Hypertension    Pulmonary nodule    renal stents    Stroke ([place])    TIA (transient ischemic attack) 03/11/2018       Scheduled Meds:  baclofen  10 mg Oral TID   clopidogrel  75 mg Oral Daily   enoxaparin (LOVENOX) injection  40 mg  Subcutaneous Q24H   fluticasone furoate-vilanterol  1 puff Inhalation Daily   And   umeclidinium bromide  1 puff Inhalation Daily   metoprolol succinate  50 mg Oral Daily   mupirocin cream   Topical BID   pantoprazole  40 mg Oral Daily   rosuvastatin  40 mg Oral QHS   Continuous Infusions:  sodium chloride Stopped (11/03/22 1553)   ceFEPime (MAXIPIME) IV 2 g (11/04/22 0554)   vancomycin 1,000 mg (11/04/22 0924)   PRN Meds:.sodium chloride, methocarbamol   OBJECTIVE: Blood pressure 97/62, pulse 68, temperature 98.3 F (36.8 C), temperature source Oral, resp. rate 16,  height 5' 10.5" (1.791 m), weight 87.1 kg, SpO2 97 %.  Physical Exam  General/constitutional: no distress, pleasant HEENT: Normocephalic, PER, Conj Clear, EOMI, Oropharynx clear Neck supple CV: rrr no mrg Lungs: clear to auscultation, normal respiratory effort Abd: Soft, Nontender Ext: no edema Skin: erythema/warmth over face including nasal bridge and bilateral ear; maceration along ear/scalp junction left ear and clear serosanguinous oozing; scaly changes on scalp otherwise Neuro: nonfocal MSK: no peripheral joint swelling/tenderness/warmth; back spines nontender    Lab Results Lab Results  Component Value Date   WBC 6.3 11/04/2022   HGB 13.1 11/04/2022   HCT 39.4 11/04/2022   MCV 97.3 11/04/2022   PLT 154 11/04/2022    Lab Results  Component Value Date   CREATININE 0.98 11/04/2022   BUN 11 11/03/2022   NA 139 11/03/2022   K 3.8 11/03/2022   CL 106 11/03/2022   CO2 24 11/03/2022    Lab Results  Component Value Date   ALT 43 08/31/2022   AST 41 08/31/2022   ALKPHOS 62 08/31/2022   BILITOT 1.9 (H) 08/31/2022      Microbiology: No results found for this or any previous visit (from the past 240 hour(s)).   Serology:    Imaging: If present, new imagings (plain films, ct scans, and mri) have been personally visualized and interpreted; radiology reports have been reviewed. Decision making incorporated into the Impression / Recommendations.  1/2 ct neck Left facial cellulitis, centered in the pre-auricular region superficial to the left parotid gland. There is not a clear source. No abscess or drainable fluid collection.   Jabier Mutton, [facility] for Infectious [place] [phone] pager    11/04/2022, 12:29 PM

## 2022-11-04 NOTE — Progress Notes (Signed)
   11/04/22 0555  Assess: MEWS Score  Temp 97.7 F (36.5 C)  BP 95/73  MAP (mmHg) 79  Pulse Rate 63  Level of Consciousness Alert  SpO2 99 %  O2 Device Room Air  Assess: MEWS Score  MEWS Temp 0  MEWS Systolic 1  MEWS Pulse 0  MEWS RR 1  MEWS LOC 0  MEWS Score 2  MEWS Score Color Yellow  Assess: if the MEWS score is Yellow or Red  Were vital signs taken at a resting state? Yes  Focused Assessment Change from prior assessment (see assessment flowsheet)  Does the patient meet 2 or more of the SIRS criteria? No  MEWS guidelines implemented *See Row Information* No, vital signs rechecked  Treat  Pain Scale 0-10  Pain Score 0  Take Vital Signs  Increase Vital Sign Frequency  Yellow: Q 2hr X 2 then Q 4hr X 2, if remains yellow, continue Q 4hrs  Notify: Charge Nurse/RN  Name of Charge Nurse/RN Notified Cabin crew  Date Charge Nurse/RN Notified 11/04/22  Time Charge Nurse/RN Notified 0559  Provider Notification  Provider Name/Title J. Quillian Quince NP  Date Provider Notified 11/04/22  Time Provider Notified 0601  Method of Notification Page (Secure Chat)  Notification Reason Change in status  Provider response No new orders  Date of Provider Response 11/04/22  Time of Provider Response 0601  Document  Patient Outcome Other (Comment) (Stable)  Assess: SIRS CRITERIA  SIRS Temperature  0  SIRS Pulse 0  SIRS Respirations  0  SIRS WBC 0  SIRS Score Sum  0

## 2022-11-04 NOTE — Progress Notes (Signed)
  Progress Note   Patient: Daniel [name] FVC:[id number] DOB: August 07, 1956 DOA: 11/03/2022     1 DOS: the patient was seen and examined on 11/04/2022   Brief hospital course: 67 year old man presented with rapidly spreading facial infection.  Began after scratching the skin behind his left ear.  Admitted for IV antibiotics.  Assessment and Plan: *Erysipelas, cellulitis of face Clinically responding.  Comparison with pictures from today and admission show improvement.  Narrowed to monotherapy with cefazolin, appreciate ID.  Can transition to oral therapy in the next 48 hours if improving.  S/p lumbar surgery Dr. Lynann Bologna 09/2022 with some edema Possibly seroma.  Follow-up as an outpatient.  Nontender, no evidence to suggest infection.  Sinus bradycardia Currently resolved.  On a beta-blocker.  No further evaluation suggested.  Essential hypertension Stable, continue metoprolol.  Hyperlipidemia Statin  COPD (chronic obstructive pulmonary disease) (HCC) Stable, continue bronchodilator.      Subjective:  Feels better today, less swelling over left face and below left eye, but left ear still swollen. Still has facial pain. Now has some right face redness and ear swelling.  Wife notes some lumbar swelling s/p surgery 09/2022, had previously, went away and now returned. No new pain per patient.  PMH psoriasis   Physical Exam: Vitals:   11/04/22 0917 11/04/22 1022 11/04/22 1418 11/04/22 1636  BP:  97/62 113/78 112/73  Pulse:  68 78 77  Resp:  '16 20 18  '$ Temp:  98.3 F (36.8 C) 97.7 F (36.5 C) 98 F (36.7 C)  TempSrc:  Oral Oral Oral  SpO2: 95% 97% 98% 97%  Weight:      Height:       Physical Exam Vitals reviewed.  Constitutional:      General: He is not in acute distress.    Appearance: He is not ill-appearing or toxic-appearing.  Cardiovascular:     Rate and Rhythm: Normal rate and regular rhythm.     Heart sounds: No murmur heard. Pulmonary:     Effort: Pulmonary  effort is normal. No respiratory distress.     Breath sounds: No wheezing, rhonchi or rales.  Skin:    Comments: See pictures. Left ear erythema with marked edema of whole pinna. Dark flat spot skin behind left ear ear, superior in location. No fluctuance or drainage noted. Some right facial erythema below eye. Perhaps some right ear edema.  Neurological:     Mental Status: He is alert.  Psychiatric:        Mood and Affect: Mood normal.        Behavior: Behavior normal.             Data Reviewed: Afebrile, VSS Creatinine WNL WBC WNL  Family Communication: wife at bedside  Disposition: Status is: Inpatient Remains inpatient appropriate because: facial cellulitis  Planned Discharge Destination: Home    Time spent: 35 minutes  Author: Murray Hodgkins, MD 11/04/2022 6:10 PM  For on call review [email or web address].

## 2022-11-04 NOTE — Progress Notes (Signed)
  Transition of Care [facility]) Screening Note   Patient Details  Name: Daniel [name] Date of Birth: [birth year]   Transition of Care [facility]) CM/SW Contact:    Vassie Moselle, LCSW Phone Number: 11/04/2022, 9:13 AM    Transition of Care Department [facility]) has reviewed patient and no TOC needs have been identified at this time. We will continue to monitor patient advancement through interdisciplinary progression rounds. If new patient transition needs arise, please place a TOC consult.

## 2022-11-05 DIAGNOSIS — J449 Chronic obstructive pulmonary disease, unspecified: Secondary | ICD-10-CM | POA: Diagnosis not present

## 2022-11-05 DIAGNOSIS — L03211 Cellulitis of face: Secondary | ICD-10-CM | POA: Diagnosis not present

## 2022-11-05 LAB — CREATININE, SERUM
Creatinine, Ser: 0.85 mg/dL (ref 0.61–1.24)
GFR, Estimated: >60 mL/min (ref >60)

## 2022-11-05 MED ORDER — DOCUSATE SODIUM 100 MG PO CAPS
100.0000 mg | ORAL_CAPSULE | Freq: Every day | ORAL | Status: DC | PRN
  Administered 2022-11-05: 100 mg via ORAL
  Filled 2022-11-05: qty 1

## 2022-11-05 MED ORDER — ACETAMINOPHEN 325 MG PO TABS
650.0000 mg | ORAL_TABLET | Freq: Four times a day (QID) | ORAL | Status: DC | PRN
  Administered 2022-11-05 – 2022-11-06 (×3): 650 mg via ORAL
  Filled 2022-11-05 (×3): qty 2

## 2022-11-05 MED ORDER — BISACODYL 5 MG PO TBEC: 5.0000 mg | DELAYED_RELEASE_TABLET | Freq: Every day | ORAL | Status: DC | PRN

## 2022-11-05 NOTE — Progress Notes (Signed)
[facility]  Date of Admission:  11/03/2022     Abx: 1/3-c cefazolin  1/02-3 vanc 1/02-3 cefepime                                                              Assessment: Facial cellulitis  Psoriasis Cspine and lspine hardware   Patient already has improvement since yesterday He scratched his left ear and cellulitis started from break down of skin between the ear lobe/scalp   Clinically appears to be a streptococcal process and agree with primary team looks like erysipelas   Imaging showed no abscess of neck/face   Neck spine hardware area doesn't seem involved   ----------- 11/05/22 assessment Clinically continues to improve He has been using topical abx as well on the cracked/macerated areas on his ears. I advise with systemic antibiotics he doesn't need it. Sometimes topical abx cause contact dermatitis which can deceive him thinking infection is worse   Plan: Continue cefazolin today Tomorrow could change to cefadroxil 1 gram twice daily to finish out on 8 more days to finish by 11/14/2022 Avoid topical antibiotics Will sign off Discussed with primary team  I spent more than 35 minute reviewing data/chart, and coordinating care and >50% direct face to face time providing counseling/discussing diagnostics/treatment plan with patient   Principal Problem:   Cellulitis of face Active Problems:   COPD (chronic obstructive pulmonary disease) (HCC)   Hyperlipidemia   Hypertension   Sinus bradycardia   No Known Allergies  Scheduled Meds:  baclofen  10 mg Oral TID   clopidogrel  75 mg Oral Daily   enoxaparin (LOVENOX) injection  40 mg Subcutaneous Q24H   fluticasone furoate-vilanterol  1 puff Inhalation Daily   And   umeclidinium bromide  1 puff Inhalation Daily   metoprolol succinate  50 mg Oral Daily   mupirocin cream   Topical BID   pantoprazole  40 mg Oral Daily   rosuvastatin  40 mg Oral QHS   Continuous Infusions:   sodium chloride Stopped (11/05/22 0009)    ceFAZolin (ANCEF) IV Stopped (11/05/22 0710)   PRN Meds:.sodium chloride, bisacodyl, docusate sodium, methocarbamol   SUBJECTIVE: Thinks the swelling better still No extension of redness No blister No pain No f/c No n/v/diarrhea  He has been putting on topical mupirocin along ears  Review of Systems: ROS All other ROS was negative, except mentioned above     OBJECTIVE: Vitals:   11/04/22 1636 11/04/22 1952 11/05/22 0539 11/05/22 0803  BP: 112/73 116/72 104/68   Pulse: 77 77 82   Resp: '18 18 18   '$ Temp: 98 F (36.7 C) 98.3 F (36.8 C) 99 F (37.2 C)   TempSrc: Oral Oral Oral   SpO2: 97% 97% 98% 95%  Weight:      Height:       Body mass index is 27.16 kg/m.  Physical Exam  General/constitutional: no distress, pleasant HEENT: Normocephalic, PER, Conj Clear, EOMI, Oropharynx clear Neck supple CV: rrr no mrg Lungs: clear to auscultation, normal respiratory effort Abd: Soft, Nontender Ext: no edema Skin: the redness on face I think is getting more faint; there is some receding of redness at the upper chest/neck area; no blister; no new redness; stable bilateral  junction ear lobe-scalp slight maceration  Lab Results Lab Results  Component Value Date   WBC 6.3 11/04/2022   HGB 13.1 11/04/2022   HCT 39.4 11/04/2022   MCV 97.3 11/04/2022   PLT 154 11/04/2022    Lab Results  Component Value Date   CREATININE 0.85 11/05/2022   BUN 11 11/03/2022   NA 139 11/03/2022   K 3.8 11/03/2022   CL 106 11/03/2022   CO2 24 11/03/2022    Lab Results  Component Value Date   ALT 43 08/31/2022   AST 41 08/31/2022   ALKPHOS 62 08/31/2022   BILITOT 1.9 (H) 08/31/2022      Microbiology: No results found for this or any previous visit (from the past 240 hour(s)).   Serology:   Imaging: If present, new imagings (plain films, ct scans, and mri) have been personally visualized and interpreted; radiology reports have been  reviewed. Decision making incorporated into the Impression / Recommendations.   Jabier Mutton, [facility] [phone] pager    11/05/2022, 9:56 AM

## 2022-11-05 NOTE — Plan of Care (Signed)
  Problem: Nutrition: Goal: Adequate nutrition will be maintained Outcome: Progressing   Problem: Coping: Goal: Level of anxiety will decrease Outcome: Progressing   Problem: Elimination: Goal: Will not experience complications related to bowel motility Outcome: Progressing Goal: Will not experience complications related to urinary retention Outcome: Progressing   Problem: Safety: Goal: Ability to remain free from injury will improve Outcome: Progressing   Problem: Skin Integrity: Goal: Risk for impaired skin integrity will decrease Outcome: Progressing

## 2022-11-05 NOTE — Progress Notes (Signed)
  Progress Note   Patient: Daniel [name] KTG:[id number] DOB: [birth year] DOA: 11/03/2022     2 DOS: the patient was seen and examined on 11/05/2022   Brief hospital course: 67 year old man presented with rapidly spreading facial infection.  Began after scratching the skin behind his left ear.  Admitted for IV antibiotics.  Assessment and Plan: *Erysipelas, cellulitis of face Narrowed 1/3 to monotherapy with cefazolin, appreciate ID.  Improving. Can transition to oral therapy in the next 24 hours if improving.   S/p lumbar surgery Dr. Lynann Bologna 09/2022 with some edema Possibly seroma.  Follow-up as an outpatient.  was non-tender, no evidence to suggest infection.   Sinus bradycardia Resolved.  On a beta-blocker.  No further evaluation suggested.   Essential hypertension Stable, continue metoprolol.   Hyperlipidemia Statin   COPD (chronic obstructive pulmonary disease) (HCC) Stable, continue bronchodilator.      Subjective:  Feels better but has some left neck swelling.  Physical Exam: Vitals:   11/04/22 1636 11/04/22 1952 11/05/22 0539 11/05/22 0803  BP: 112/73 116/72 104/68   Pulse: 77 77 82   Resp: '18 18 18   '$ Temp: 98 F (36.7 C) 98.3 F (36.8 C) 99 F (37.2 C)   TempSrc: Oral Oral Oral   SpO2: 97% 97% 98% 95%  Weight:      Height:       Physical Exam Vitals reviewed.  Constitutional:      General: He is not in acute distress.    Appearance: He is not ill-appearing or toxic-appearing.  Cardiovascular:     Rate and Rhythm: Normal rate and regular rhythm.     Heart sounds: No murmur heard. Pulmonary:     Effort: Pulmonary effort is normal. No respiratory distress.     Breath sounds: No wheezing, rhonchi or rales.  Skin:    Comments: Much less edema of left face, some decrease in edema of left ear. Still has erythema of ear and face. Mild tenderness below left ear. Neck is full on the left, but nontender, no induration, no fluctuance.  Neurological:      Mental Status: He is alert.  Psychiatric:        Mood and Affect: Mood normal.        Behavior: Behavior normal.   Data Reviewed: Creatinine WNL  Family Communication: none present  Disposition: Status is: Inpatient Remains inpatient appropriate because: severe facial cellulitis  Planned Discharge Destination: Home    Time spent: 20 minutes  Author: Murray Hodgkins, MD 11/05/2022 8:56 AM  For on call review [email or web address].

## 2022-11-06 DIAGNOSIS — L03211 Cellulitis of face: Secondary | ICD-10-CM | POA: Diagnosis not present

## 2022-11-06 LAB — CREATININE, SERUM
Creatinine, Ser: 0.9 mg/dL (ref 0.61–1.24)
GFR, Estimated: >60 mL/min (ref >60)

## 2022-11-06 MED ORDER — CEFADROXIL 1 G PO TABS: 1.0000 g | ORAL_TABLET | Freq: Two times a day (BID) | ORAL | 0 refills | Status: DC

## 2022-11-06 NOTE — Discharge Summary (Signed)
Physician Discharge Summary   Patient: Daniel [name] MRN: [id number] DOB: 02/12/1956  Admit date:     11/03/2022  Discharge date: 11/06/22  Discharge Physician: Murray Hodgkins   PCP: [name], MD   Recommendations at discharge:   Resolution of erysipelas  Discharge Diagnoses: Principal Problem:   Facial cellulitis Active Problems:   COPD (chronic obstructive pulmonary disease) ([place])   Hyperlipidemia   Hypertension   Sinus bradycardia  Resolved Problems:   * No resolved hospital problems. *  Hospital Course: 67 year old man presented with rapidly spreading facial infection.  Began after scratching the skin behind his left ear.  Admitted for IV antibiotics. Slow to improve, was seen by ID.  Hospitalization uncomplicated, discharged home in good condition.  *Erysipelas, cellulitis of face Narrowed 1/3 to monotherapy with cefazolin, appreciate ID.  Continues to improve.  Change to cefadroxil 1 gram twice daily to finish out on 8 more days   S/p lumbar surgery Dr. Lynann Bologna 09/2022 with some edema Possibly seroma.  Follow-up as an outpatient.  was non-tender, no evidence to suggest infection.   Essential hypertension Stable, continue metoprolol.   Hyperlipidemia Statin   COPD (chronic obstructive pulmonary disease) (HCC) Stable, continue bronchodilator.  Consultants:  ID Procedures performed:  None  Disposition: Home Diet recommendation:  Regular diet DISCHARGE MEDICATION: Allergies as of 11/06/2022   No Known Allergies      Medication List     TAKE these medications    albuterol 108 (90 Base) MCG/ACT inhaler Commonly known as: VENTOLIN HFA Inhale 2 puffs into the lungs every 6 (six) hours as needed for wheezing or shortness of breath.   baclofen 10 MG tablet Commonly known as: LIORESAL Take 1 tablet (10 mg total) by mouth 3 (three) times daily.   cefadroxil 1 g tablet Commonly known as: DURICEF Take 1 tablet (1 g total) by mouth 2 (two) times  daily for 9 days.   clopidogrel 75 MG tablet Commonly known as: PLAVIX TAKE 1 TABLET(75 MG) BY MOUTH DAILY   GLUCOSAMINE-CHONDROITIN PO Take 2 tablets by mouth daily. With Vit D   HYDROcodone-acetaminophen 5-325 MG tablet Commonly known as: NORCO/VICODIN Take 1 tablet by mouth every 6 (six) hours as needed for severe pain.   methocarbamol 500 MG tablet Commonly known as: ROBAXIN Take 1 tablet (500 mg total) by mouth every 6 (six) hours as needed for muscle spasms.   metoprolol succinate 50 MG 24 hr tablet Commonly known as: TOPROL-XL TAKE 1 TABLET(50 MG) BY MOUTH DAILY WITH OR IMMEDIATELY FOLLOWING A MEAL   pantoprazole 40 MG tablet Commonly known as: PROTONIX TAKE 1 TABLET(40 MG) BY MOUTH DAILY FOR STOMACH   rosuvastatin 40 MG tablet Commonly known as: CRESTOR TAKE 1 TABLET(40 MG) BY MOUTH DAILY FOR CHOLESTEROL   SUPER B COMPLEX/C PO Take 1 tablet by mouth daily.   Trelegy Ellipta 100-62.5-25 MCG/ACT Aepb Generic drug: Fluticasone-Umeclidin-Vilant Inhale 1 puff into the lungs daily.               Discharge Care Instructions  (From admission, onward)           Start     Ordered   11/06/22 0000  Discharge wound care:       Comments: Keep area clean and dry   11/06/22 1321            Follow-up Information     [name], MD. Schedule an appointment as soon as possible for a visit in 1 week(s).   Specialty: Family Medicine  Contact information: [place] [address] [phone]                Feels better  Discharge Exam: Filed Weights   11/03/22 0901  Weight: 87.1 kg   Physical Exam Vitals reviewed.  Constitutional:      General: He is not in acute distress.    Appearance: He is not ill-appearing or toxic-appearing.  Cardiovascular:     Rate and Rhythm: Normal rate and regular rhythm.     Heart sounds: No murmur heard. Pulmonary:     Effort: Pulmonary effort is normal. No respiratory distress.     Breath  sounds: No wheezing, rhonchi or rales.  Skin:    Comments: Ear left appears near normal now, skin behind the ear appears much better, minimal residual erythema.  Nontender.  Some erythema and swelling of the neck is nontender to palpation.  Neurological:     Mental Status: He is alert.  Psychiatric:        Behavior: Behavior normal.      Condition at discharge: good  The results of significant diagnostics from this hospitalization (including imaging, microbiology, ancillary and laboratory) are listed below for reference.   Imaging Studies: CT SOFT TISSUE NECK W CONTRAST  Result Date: 11/03/2022 CLINICAL DATA:  Facial cellulitis EXAM: CT NECK WITH CONTRAST TECHNIQUE: Multidetector CT imaging of the neck was performed using the standard protocol following the bolus administration of intravenous contrast. RADIATION DOSE REDUCTION: This exam was performed according to the departmental dose-optimization program which includes automated exposure control, adjustment of the mA and/or kV according to patient size and/or use of iterative reconstruction technique. CONTRAST:  83m OMNIPAQUE IOHEXOL 300 MG/ML  SOLN COMPARISON:  None Available. FINDINGS: Pharynx and larynx: Normal Salivary glands: There is inflammatory change surrounding the left parotid gland, which is mildly enlarged relative to the right. No focal abnormality within the left parotid gland. No sialolithiasis. The submandibular glands are normal. Thyroid: Normal. Lymph nodes: No enlarged or abnormal density Vascular: Bilateral carotid bifurcation atherosclerosis. Aortic atherosclerosis. Limited intracranial: Normal. Visualized orbits: Normal. Mastoids and visualized paranasal sinuses: Normal. Skeleton: Negative Upper chest: Clear Other: Inflammatory induration throughout the left facial subcutaneous tissues, centered in the pre-auricular region superficial to the left parotid gland. There is thickening of the left platysma. There is no abscess  or drainable fluid collection. IMPRESSION: Left facial cellulitis, centered in the pre-auricular region superficial to the left parotid gland. There is not a clear source. No abscess or drainable fluid collection. Electronically Signed   By: KUlyses JarredM.D.   On: 11/03/2022 21:24    Microbiology: Results for orders placed or performed during the hospital encounter of 08/31/22  Surgical pcr screen     Status: Abnormal   Collection Time: 08/31/22 11:22 AM   Specimen: Nasal Mucosa; Nasal Swab  Result Value Ref Range Status   MRSA, PCR NEGATIVE NEGATIVE Final   Staphylococcus aureus POSITIVE (A) NEGATIVE Final    Comment: (NOTE) The Xpert SA Assay (FDA approved for NASAL specimens in patients 214years of age and older), is one component of a comprehensive surveillance program. It is not intended to diagnose infection nor to guide or monitor treatment. Performed at [facility] [address], [place] [address]    Labs: CBC: Recent Labs  Lab 11/03/22 1015 11/04/22 0559  WBC 5.4 6.3  NEUTROABS 3.4  --   HGB 13.7 13.1  HCT 40.6 39.4  MCV 96.0 97.3  PLT 159 314   Basic Metabolic Panel: Recent Labs  Lab 11/03/22 1015 11/04/22 0559 11/05/22 0537 11/06/22 0527  NA 139  --   --   --   K 3.8  --   --   --   CL 106  --   --   --   CO2 24  --   --   --   GLUCOSE 102*  --   --   --   BUN 11  --   --   --   CREATININE 0.86 0.98 0.85 0.90  CALCIUM 9.3  --   --   --    Liver Function Tests: No results for input(s): "AST", "ALT", "ALKPHOS", "BILITOT", "PROT", "ALBUMIN" in the last 168 hours. CBG: No results for input(s): "GLUCAP" in the last 168 hours.  Discharge time spent: less than 30 minutes.  Signed: Murray Hodgkins, MD [facility] 11/06/2022

## 2022-11-06 NOTE — Progress Notes (Signed)
Mobility Specialist - Progress Note   11/06/22 0947  Mobility  Activity Ambulated independently in hallway  Level of Assistance Independent  Assistive Device None  Distance Ambulated (ft) 500 ft  Activity Response Tolerated well  Mobility Referral Yes  $Mobility charge 1 Mobility   Pt received in bed and agreeable to mobility. No complaints during mobility. Pt to bed after session with all needs met & wife in room.  [facility]

## 2022-11-06 NOTE — Plan of Care (Signed)

## 2022-11-06 NOTE — Care Management Important Message (Signed)
Important Message  Patient Details IM Letter given. Name: Daniel [name] MRN: [id number] Date of Birth: 04-22-1956   Medicare Important Message Given:  Yes     Kerin Salen 11/06/2022, 10:20 AM

## 2022-11-09 ENCOUNTER — Telehealth: Payer: Self-pay | Admitting: *Deleted

## 2022-11-09 ENCOUNTER — Telehealth: Payer: Self-pay

## 2022-11-09 NOTE — Progress Notes (Signed)
  Care Coordination  Note  11/09/2022 Name: Daniel [name] MRN: [id number] DOB: October 13, 1956  Daniel [name] is a 67 y.o. year old primary care patient of Stacks, Cletus Gash, MD. I reached out to Carola Rhine by phone today to assist with scheduling a follow up appointment. Daniel [name] verbally consented to my assistance.       Follow up plan: Hospital Follow Up appointment scheduled with Livia Snellen, Cletus Gash, MD) on (11/11/22) at (9:40 AM).  [place]  Direct Dial: [phone]

## 2022-11-09 NOTE — Patient Outreach (Signed)
  Care Coordination [facility] Note Transition Care Management Follow-up Telephone Call Date of discharge and from where: 11/06/21 [facility] dx facial swelling How have you been since you were released from the hospital? I have been feeling good.  The swelling is much better and I am taking my antibiotic as ordered.  States his breathing has been good Any questions or concerns? No  Items Reviewed: Did the pt receive and understand the discharge instructions provided? Yes  Medications obtained and verified? Yes  Other? No  Any new allergies since your discharge? No  Dietary orders reviewed? No Do you have support at home? Yes   Home Care and Equipment/Supplies: Were home health services ordered? no If so, what is the name of the agency? N/a  Has the agency set up a time to come to the patient's home? not applicable Were any new equipment or medical supplies ordered?  No What is the name of the medical supply agency? N/a Were you able to get the supplies/equipment? not applicable Do you have any questions related to the use of the equipment or supplies? No  Functional Questionnaire: (I = Independent and D = Dependent) ADLs: I  Bathing/Dressing- I  Meal Prep- I  Eating- I  Maintaining continence- I  Transferring/Ambulation- I  Managing Meds- I  Follow up appointments reviewed:  PCP Hospital f/u appt confirmed? No  Scheduled to see  on  @ messaged care guide to schedule appointment. [facility] f/u appt confirmed? No  Scheduled to see  on  @ . Are transportation arrangements needed? No  If their condition worsens, is the pt aware to call PCP or go to the Emergency Dept.? Yes Was the patient provided with contact information for the PCP's office or ED? Yes Was to pt encouraged to call back with questions or concerns? Yes  SDOH assessments and interventions completed:   Yes SDOH Interventions Today    Flowsheet Row Most Recent Value  SDOH Interventions   Food Insecurity  Interventions Intervention Not Indicated  Transportation Interventions Intervention Not Indicated       Care Coordination Interventions:  PCP follow up appointment requested   Encounter Outcome:  Pt. Visit Completed   Peter Garter RN, BSN,CCM, CDE Care Management Coordinator [place] [facility] [phone]

## 2022-11-11 ENCOUNTER — Encounter: Payer: Self-pay | Admitting: Family Medicine

## 2022-11-11 ENCOUNTER — Ambulatory Visit (INDEPENDENT_AMBULATORY_CARE_PROVIDER_SITE_OTHER): Payer: Medicare HMO | Admitting: Family Medicine

## 2022-11-11 VITALS — BP 93/53 | HR 62 | Temp 97.6°F | Ht 70.5 in | Wt 191.2 lb

## 2022-11-11 DIAGNOSIS — L03211 Cellulitis of face: Secondary | ICD-10-CM

## 2022-11-11 DIAGNOSIS — I10 Essential (primary) hypertension: Secondary | ICD-10-CM | POA: Diagnosis not present

## 2022-11-11 MED ORDER — AMOXICILLIN-POT CLAVULANATE 875-125 MG PO TABS: 1.0000 | ORAL_TABLET | Freq: Two times a day (BID) | ORAL | 0 refills | Status: DC

## 2022-11-11 MED ORDER — CEFTRIAXONE SODIUM 1 G IJ SOLR
1.0000 g | Freq: Once | INTRAMUSCULAR | Status: AC
  Administered 2022-11-11: 1 g via INTRAMUSCULAR

## 2022-11-11 MED ORDER — METOPROLOL SUCCINATE ER 25 MG PO TB24: 25.0000 mg | ORAL_TABLET | Freq: Every day | ORAL | 1 refills | Status: DC

## 2022-11-11 NOTE — Progress Notes (Signed)
1  Subjective:  Patient ID: Daniel [name], male    DOB: 09/19/1956  Age: 67 y.o. MRN: [id number]  CC: Hospitalization Follow-up   HPI Daniel [name] presents for return of swelling and redness at left cheek and postauricular area. On duricef since DC. Had been on Ancef at DC 5 days ago. Started out of cefepime and vanc. Denies pain, fever chills currently, but left fwce feels tight from the renewed swelling. Was hospitalized at [facility] with ID consult from 11/03/22 tp 11/06/22.  Had to hold metoprolol in hospital due to hypotension. Resumed at DC. Denies feeling dizzy, orthostasis, syncope, but BP low today.     11/11/2022    9:54 AM 08/04/2022    9:31 AM 07/30/2022   10:27 AM  Depression screen PHQ 2/9  Decreased Interest 0 0 0  Down, Depressed, Hopeless 0 0 0  PHQ - 2 Score 0 0 0  Altered sleeping   0  Tired, decreased energy   0  Change in appetite   0  Feeling bad or failure about yourself    0  Trouble concentrating   0  Moving slowly or fidgety/restless   0  Suicidal thoughts   0  PHQ-9 Score   0  Difficult doing work/chores   Not difficult at all    History Daniel [name] has a past medical history of Acute ischemic stroke ([place]) (03/12/2018), Anesthesia complication, BPH (benign prostatic hyperplasia), COPD (chronic obstructive pulmonary disease) ([place]), Hyperlipidemia, Hypertension, Pulmonary nodule, renal stents, Stroke ([place]), and TIA (transient ischemic attack) (03/11/2018).   He has a past surgical history that includes renal stents; Cervical fusion; Rotator cuff repair (Left, 12/2021); Pterygium excision (2020); and Transforaminal lumbar interbody fusion (tlif) with pedicle screw fixation 1 level (Right, 09/09/2022).   His family history includes Cancer in his father; Emphysema in his father; Lung cancer in his mother.He reports that he has quit smoking. His smoking use included cigarettes. He has a 42.00 pack-year smoking history. He has never used smokeless tobacco. He  reports current alcohol use of about 21.0 standard drinks of alcohol per week. He reports that he does not use drugs.    ROS Review of Systems  Constitutional:  Negative for fever.  Eyes:        Left was sswollen shut at admission. Now nml   Respiratory:  Negative for shortness of breath.   Cardiovascular:  Negative for chest pain.  Musculoskeletal:  Negative for arthralgias.  Skin:  Negative for rash.    Objective:  BP (!) 93/53   Pulse 62   Temp 97.6 F (36.4 C)   Ht 5' 10.5" (1.791 m)   Wt 191 lb 3.2 oz (86.7 kg)   SpO2 98%   BMI 27.05 kg/m   BP Readings from Last 3 Encounters:  11/11/22 (!) 93/53  11/06/22 106/64  10/07/22 104/62    Wt Readings from Last 3 Encounters:  11/11/22 191 lb 3.2 oz (86.7 kg)  11/03/22 192 lb (87.1 kg)  10/07/22 188 lb (85.3 kg)     Physical Exam Vitals reviewed.  Constitutional:      Appearance: He is well-developed.  HENT:     Head: Normocephalic and atraumatic.     Right Ear: External ear normal.     Left Ear: External ear normal.     Mouth/Throat:     Pharynx: No oropharyngeal exudate or posterior oropharyngeal erythema.  Eyes:     Pupils: Pupils are equal, round, and reactive to light.  Cardiovascular:  Rate and Rhythm: Normal rate and regular rhythm.     Heart sounds: No murmur heard. Pulmonary:     Effort: No respiratory distress.     Breath sounds: Normal breath sounds.  Musculoskeletal:     Cervical back: Normal range of motion and neck supple.  Skin:    Findings: Erythema (of both cheeks. Worse on left with accompanying edema. Also red behind the left ear. Some scaly, oily patches.) present.  Neurological:     Mental Status: He is alert and oriented to person, place, and time.       Assessment & Plan:   [name] was seen today for hospitalization follow-up.  Diagnoses and all orders for this visit:  Facial cellulitis -     cefTRIAXone (ROCEPHIN) injection 1 g  Essential hypertension -      Discontinue: metoprolol succinate (TOPROL-XL) 25 MG 24 hr tablet; Take 1 tablet (25 mg total) by mouth daily. TAKE 1 TABLET(50 MG) BY MOUTH DAILY WITH OR IMMEDIATELY FOLLOWING A MEAL -     metoprolol succinate (TOPROL-XL) 25 MG 24 hr tablet; Take 1 tablet (25 mg total) by mouth daily.  Other orders -     amoxicillin-clavulanate (AUGMENTIN) 875-125 MG tablet; Take 1 tablet by mouth 2 (two) times daily. Take all of this medication       I have discontinued Daniel Macke "[name]"'s metoprolol succinate and cefadroxil. I have also changed his metoprolol succinate. Additionally, I am having him start on amoxicillin-clavulanate. Lastly, I am having him maintain his SUPER B COMPLEX/C PO, GLUCOSAMINE-CHONDROITIN PO, clopidogrel, Trelegy Ellipta, HYDROcodone-acetaminophen, albuterol, methocarbamol, baclofen, pantoprazole, and rosuvastatin. We administered cefTRIAXone.  Allergies as of 11/11/2022   No Known Allergies      Medication List        Accurate as of November 11, 2022  7:49 PM. If you have any questions, ask your nurse or doctor.          STOP taking these medications    cefadroxil 1 g tablet Commonly known as: DURICEF Stopped by: [name], MD       TAKE these medications    albuterol 108 (90 Base) MCG/ACT inhaler Commonly known as: VENTOLIN HFA Inhale 2 puffs into the lungs every 6 (six) hours as needed for wheezing or shortness of breath.   amoxicillin-clavulanate 875-125 MG tablet Commonly known as: AUGMENTIN Take 1 tablet by mouth 2 (two) times daily. Take all of this medication Started by: [name], MD   baclofen 10 MG tablet Commonly known as: LIORESAL Take 1 tablet (10 mg total) by mouth 3 (three) times daily.   clopidogrel 75 MG tablet Commonly known as: PLAVIX TAKE 1 TABLET(75 MG) BY MOUTH DAILY   GLUCOSAMINE-CHONDROITIN PO Take 2 tablets by mouth daily. With Vit D   HYDROcodone-acetaminophen 5-325 MG tablet Commonly known as:  NORCO/VICODIN Take 1 tablet by mouth every 6 (six) hours as needed for severe pain.   methocarbamol 500 MG tablet Commonly known as: ROBAXIN Take 1 tablet (500 mg total) by mouth every 6 (six) hours as needed for muscle spasms.   metoprolol succinate 25 MG 24 hr tablet Commonly known as: TOPROL-XL Take 1 tablet (25 mg total) by mouth daily. What changed:  medication strength how much to take how to take this when to take this additional instructions Changed by: [name], MD   pantoprazole 40 MG tablet Commonly known as: PROTONIX TAKE 1 TABLET(40 MG) BY MOUTH DAILY FOR STOMACH   rosuvastatin 40 MG tablet Commonly known as:  CRESTOR TAKE 1 TABLET(40 MG) BY MOUTH DAILY FOR CHOLESTEROL   SUPER B COMPLEX/C PO Take 1 tablet by mouth daily.   Trelegy Ellipta 100-62.5-25 MCG/ACT Aepb Generic drug: Fluticasone-Umeclidin-Vilant Inhale 1 puff into the lungs daily.         Follow-up: Return in about 1 week (around 11/18/2022).  [name], M.D.

## 2022-11-18 ENCOUNTER — Encounter: Payer: Self-pay | Admitting: Family Medicine

## 2022-11-18 ENCOUNTER — Ambulatory Visit (INDEPENDENT_AMBULATORY_CARE_PROVIDER_SITE_OTHER): Payer: Medicare HMO | Admitting: Family Medicine

## 2022-11-18 VITALS — BP 107/63 | HR 75 | Temp 98.5°F | Ht 70.5 in | Wt 191.4 lb

## 2022-11-18 DIAGNOSIS — L03211 Cellulitis of face: Secondary | ICD-10-CM

## 2022-11-18 DIAGNOSIS — L409 Psoriasis, unspecified: Secondary | ICD-10-CM

## 2022-11-18 MED ORDER — MOMETASONE FUROATE 0.1 % EX CREA: 1.0000 | TOPICAL_CREAM | Freq: Every day | CUTANEOUS | 5 refills | Status: DC

## 2022-11-18 NOTE — Progress Notes (Signed)
Subjective:  Patient ID: Daniel [name], male    DOB: January 21, 1956  Age: 67 y.o. MRN: [id number]  CC: Follow-up   HPI Daniel [name] presents for recheck of facial cellulitis.  Feels less pressure, soreness. Not as red. Has antibiotics left for three more days. Had 2 LBM yesterday.      11/18/2022   11:09 AM 11/11/2022    9:54 AM 08/04/2022    9:31 AM  Depression screen PHQ 2/9  Decreased Interest 0 0 0  Down, Depressed, Hopeless 0 0 0  PHQ - 2 Score 0 0 0    History Daniel [name] has a past medical history of Acute ischemic stroke ([place]) (03/12/2018), Anesthesia complication, BPH (benign prostatic hyperplasia), COPD (chronic obstructive pulmonary disease) ([place]), Hyperlipidemia, Hypertension, Pulmonary nodule, renal stents, Stroke ([place]), and TIA (transient ischemic attack) (03/11/2018).   He has a past surgical history that includes renal stents; Cervical fusion; Rotator cuff repair (Left, 12/2021); Pterygium excision (2020); and Transforaminal lumbar interbody fusion (tlif) with pedicle screw fixation 1 level (Right, 09/09/2022).   His family history includes Cancer in his father; Emphysema in his father; Lung cancer in his mother.He reports that he has quit smoking. His smoking use included cigarettes. He has a 42.00 pack-year smoking history. He has never used smokeless tobacco. He reports current alcohol use of about 21.0 standard drinks of alcohol per week. He reports that he does not use drugs.    ROS Review of Systems  Constitutional:  Negative for fever.  Gastrointestinal:  Positive for diarrhea (2 loose BM yesterday). Negative for abdominal distention and abdominal pain.    Objective:  BP 107/63   Pulse 75   Temp 98.5 F (36.9 C)   Ht 5' 10.5" (1.791 m)   Wt 191 lb 6.4 oz (86.8 kg)   SpO2 97%   BMI 27.07 kg/m   BP Readings from Last 3 Encounters:  11/18/22 107/63  11/11/22 (!) 93/53  11/06/22 106/64    Wt Readings from Last 3 Encounters:  11/18/22 191 lb 6.4  oz (86.8 kg)  11/11/22 191 lb 3.2 oz (86.7 kg)  11/03/22 192 lb (87.1 kg)     Physical Exam Vitals reviewed.  Constitutional:      Appearance: He is well-developed.  HENT:     Head: Normocephalic and atraumatic.     Right Ear: External ear normal.     Left Ear: External ear normal.     Mouth/Throat:     Pharynx: No oropharyngeal exudate or posterior oropharyngeal erythema.  Eyes:     Pupils: Pupils are equal, round, and reactive to light.  Cardiovascular:     Rate and Rhythm: Normal rate and regular rhythm.     Heart sounds: No murmur heard. Pulmonary:     Effort: No respiratory distress.     Breath sounds: Normal breath sounds.  Musculoskeletal:     Cervical back: Normal range of motion and neck supple.  Skin:    Findings: Rash (mild scale at left scalp, beard area) present.     Comments: Left face mildly to moderately erythematous. No edema. No papules or nodules  Neurological:     Mental Status: He is alert and oriented to person, place, and time.       Assessment & Plan:   Daniel [name] was seen today for follow-up.  Diagnoses and all orders for this visit:  Facial cellulitis  Psoriasis  Other orders -     mometasone (ELOCON) 0.1 % cream; Apply 1 Application topically daily. To  affected areas    Finish the antibiotic. Treat psoriasis with elocon for face, postauricular. Use probiotic and Immodium for the LBMs   I am having Daniel Rhine "[name]" start on mometasone. I am also having him maintain his SUPER B COMPLEX/C PO, GLUCOSAMINE-CHONDROITIN PO, clopidogrel, Trelegy Ellipta, HYDROcodone-acetaminophen, albuterol, methocarbamol, baclofen, pantoprazole, rosuvastatin, metoprolol succinate, and amoxicillin-clavulanate.  Allergies as of 11/18/2022   No Known Allergies      Medication List        Accurate as of November 18, 2022  3:16 PM. If you have any questions, ask your nurse or doctor.          albuterol 108 (90 Base) MCG/ACT inhaler Commonly  known as: VENTOLIN HFA Inhale 2 puffs into the lungs every 6 (six) hours as needed for wheezing or shortness of breath.   amoxicillin-clavulanate 875-125 MG tablet Commonly known as: AUGMENTIN Take 1 tablet by mouth 2 (two) times daily. Take all of this medication   baclofen 10 MG tablet Commonly known as: LIORESAL Take 1 tablet (10 mg total) by mouth 3 (three) times daily.   clopidogrel 75 MG tablet Commonly known as: PLAVIX TAKE 1 TABLET(75 MG) BY MOUTH DAILY   GLUCOSAMINE-CHONDROITIN PO Take 2 tablets by mouth daily. With Vit D   HYDROcodone-acetaminophen 5-325 MG tablet Commonly known as: NORCO/VICODIN Take 1 tablet by mouth every 6 (six) hours as needed for severe pain.   methocarbamol 500 MG tablet Commonly known as: ROBAXIN Take 1 tablet (500 mg total) by mouth every 6 (six) hours as needed for muscle spasms.   metoprolol succinate 25 MG 24 hr tablet Commonly known as: TOPROL-XL Take 1 tablet (25 mg total) by mouth daily.   mometasone 0.1 % cream Commonly known as: Elocon Apply 1 Application topically daily. To affected areas Started by: [name], MD   pantoprazole 40 MG tablet Commonly known as: PROTONIX TAKE 1 TABLET(40 MG) BY MOUTH DAILY FOR STOMACH   rosuvastatin 40 MG tablet Commonly known as: CRESTOR TAKE 1 TABLET(40 MG) BY MOUTH DAILY FOR CHOLESTEROL   SUPER B COMPLEX/C PO Take 1 tablet by mouth daily.   Trelegy Ellipta 100-62.5-25 MCG/ACT Aepb Generic drug: Fluticasone-Umeclidin-Vilant Inhale 1 puff into the lungs daily.         Follow-up: Return if symptoms worsen or fail to improve.  [name], M.D.

## 2022-11-27 ENCOUNTER — Other Ambulatory Visit: Payer: Self-pay | Admitting: Family Medicine

## 2022-11-30 DIAGNOSIS — M5416 Radiculopathy, lumbar region: Secondary | ICD-10-CM | POA: Diagnosis not present

## 2022-12-01 ENCOUNTER — Other Ambulatory Visit: Payer: Self-pay | Admitting: Family Medicine

## 2022-12-01 DIAGNOSIS — I10 Essential (primary) hypertension: Secondary | ICD-10-CM

## 2022-12-10 ENCOUNTER — Encounter: Payer: Medicare HMO | Admitting: Family Medicine

## 2022-12-10 ENCOUNTER — Other Ambulatory Visit: Payer: Self-pay | Admitting: *Deleted

## 2022-12-10 DIAGNOSIS — Z0000 Encounter for general adult medical examination without abnormal findings: Secondary | ICD-10-CM

## 2022-12-10 DIAGNOSIS — I10 Essential (primary) hypertension: Secondary | ICD-10-CM

## 2022-12-10 DIAGNOSIS — Z125 Encounter for screening for malignant neoplasm of prostate: Secondary | ICD-10-CM

## 2022-12-10 DIAGNOSIS — Z13220 Encounter for screening for lipoid disorders: Secondary | ICD-10-CM

## 2022-12-10 DIAGNOSIS — E559 Vitamin D deficiency, unspecified: Secondary | ICD-10-CM

## 2022-12-10 DIAGNOSIS — E785 Hyperlipidemia, unspecified: Secondary | ICD-10-CM

## 2022-12-15 DIAGNOSIS — I10 Essential (primary) hypertension: Secondary | ICD-10-CM | POA: Diagnosis not present

## 2022-12-15 DIAGNOSIS — Z8679 Personal history of other diseases of the circulatory system: Secondary | ICD-10-CM | POA: Diagnosis not present

## 2022-12-15 DIAGNOSIS — Z79899 Other long term (current) drug therapy: Secondary | ICD-10-CM | POA: Diagnosis not present

## 2022-12-15 DIAGNOSIS — I150 Renovascular hypertension: Secondary | ICD-10-CM | POA: Diagnosis not present

## 2022-12-15 DIAGNOSIS — E785 Hyperlipidemia, unspecified: Secondary | ICD-10-CM | POA: Diagnosis not present

## 2022-12-15 DIAGNOSIS — I701 Atherosclerosis of renal artery: Secondary | ICD-10-CM | POA: Diagnosis not present

## 2022-12-15 DIAGNOSIS — Z87891 Personal history of nicotine dependence: Secondary | ICD-10-CM | POA: Diagnosis not present

## 2022-12-21 ENCOUNTER — Other Ambulatory Visit: Payer: Self-pay | Admitting: *Deleted

## 2022-12-21 ENCOUNTER — Encounter: Payer: Medicare HMO | Admitting: Family Medicine

## 2022-12-21 ENCOUNTER — Other Ambulatory Visit: Payer: Medicare HMO

## 2022-12-21 DIAGNOSIS — Z125 Encounter for screening for malignant neoplasm of prostate: Secondary | ICD-10-CM

## 2022-12-21 DIAGNOSIS — E559 Vitamin D deficiency, unspecified: Secondary | ICD-10-CM

## 2022-12-21 DIAGNOSIS — Z0000 Encounter for general adult medical examination without abnormal findings: Secondary | ICD-10-CM

## 2022-12-21 DIAGNOSIS — I10 Essential (primary) hypertension: Secondary | ICD-10-CM

## 2022-12-21 DIAGNOSIS — E785 Hyperlipidemia, unspecified: Secondary | ICD-10-CM | POA: Diagnosis not present

## 2022-12-21 LAB — URINALYSIS
Bilirubin, UA: NEGATIVE
Glucose, UA: NEGATIVE
Leukocytes,UA: NEGATIVE
Nitrite, UA: NEGATIVE
RBC, UA: NEGATIVE
Specific Gravity, UA: 1.025 (ref 1.005–1.030)
Urobilinogen, Ur: 0.2 mg/dL (ref 0.2–1.0)
pH, UA: 5.5 (ref 5.0–7.5)

## 2022-12-22 LAB — LIPID PANEL
Chol/HDL Ratio: 2.3 ratio (ref 0.0–5.0)
Cholesterol, Total: 153 mg/dL (ref 100–199)
HDL: 68 mg/dL (ref >39)
LDL Chol Calc (NIH): 68 mg/dL (ref 0–99)
Triglycerides: 90 mg/dL (ref 0–149)
VLDL Cholesterol Cal: 17 mg/dL (ref 5–40)

## 2022-12-22 LAB — CMP14+EGFR
ALT: 22 IU/L (ref 0–44)
AST: 27 IU/L (ref 0–40)
Albumin/Globulin Ratio: 2.3 — ABNORMAL HIGH (ref 1.2–2.2)
Albumin: 4.8 g/dL (ref 3.9–4.9)
Alkaline Phosphatase: 99 IU/L (ref 44–121)
BUN/Creatinine Ratio: 7 — ABNORMAL LOW (ref 10–24)
BUN: 8 mg/dL (ref 8–27)
Bilirubin Total: 1.6 mg/dL — ABNORMAL HIGH (ref 0.0–1.2)
CO2: 19 mmol/L — ABNORMAL LOW (ref 20–29)
Calcium: 9.4 mg/dL (ref 8.6–10.2)
Chloride: 106 mmol/L (ref 96–106)
Creatinine, Ser: 1.12 mg/dL (ref 0.76–1.27)
Globulin, Total: 2.1 g/dL (ref 1.5–4.5)
Glucose: 77 mg/dL (ref 70–99)
Potassium: 3.9 mmol/L (ref 3.5–5.2)
Sodium: 143 mmol/L (ref 134–144)
Total Protein: 6.9 g/dL (ref 6.0–8.5)
eGFR: 72 mL/min/{1.73_m2} (ref >59)

## 2022-12-22 LAB — CBC WITH DIFFERENTIAL/PLATELET
Basophils Absolute: 0 10*3/uL (ref 0.0–0.2)
Basos: 1 %
EOS (ABSOLUTE): 0.2 10*3/uL (ref 0.0–0.4)
Eos: 3 %
Hematocrit: 44.4 % (ref 37.5–51.0)
Hemoglobin: 15.2 g/dL (ref 13.0–17.7)
Immature Grans (Abs): 0 10*3/uL (ref 0.0–0.1)
Immature Granulocytes: 0 %
Lymphocytes Absolute: 1.6 10*3/uL (ref 0.7–3.1)
Lymphs: 26 %
MCH: 31.9 pg (ref 26.6–33.0)
MCHC: 34.2 g/dL (ref 31.5–35.7)
MCV: 93 fL (ref 79–97)
Monocytes Absolute: 0.6 10*3/uL (ref 0.1–0.9)
Monocytes: 10 %
Neutrophils Absolute: 3.8 10*3/uL (ref 1.4–7.0)
Neutrophils: 60 %
Platelets: 194 10*3/uL (ref 150–450)
RBC: 4.77 x10E6/uL (ref 4.14–5.80)
RDW: 13.1 % (ref 11.6–15.4)
WBC: 6.2 10*3/uL (ref 3.4–10.8)

## 2022-12-22 LAB — PSA, TOTAL AND FREE
PSA, Free Pct: 36.7 %
PSA, Free: 0.11 ng/mL
Prostate Specific Ag, Serum: 0.3 ng/mL (ref 0.0–4.0)

## 2022-12-22 LAB — VITAMIN D 25 HYDROXY (VIT D DEFICIENCY, FRACTURES): Vit D, 25-Hydroxy: 46.9 ng/mL (ref 30.0–100.0)

## 2022-12-28 ENCOUNTER — Encounter: Payer: Self-pay | Admitting: Family Medicine

## 2022-12-28 ENCOUNTER — Ambulatory Visit (INDEPENDENT_AMBULATORY_CARE_PROVIDER_SITE_OTHER): Payer: Medicare HMO | Admitting: Family Medicine

## 2022-12-28 VITALS — BP 97/58 | HR 95 | Temp 97.7°F | Ht 70.5 in | Wt 193.0 lb

## 2022-12-28 DIAGNOSIS — N529 Male erectile dysfunction, unspecified: Secondary | ICD-10-CM

## 2022-12-28 DIAGNOSIS — Z0000 Encounter for general adult medical examination without abnormal findings: Secondary | ICD-10-CM

## 2022-12-28 DIAGNOSIS — J449 Chronic obstructive pulmonary disease, unspecified: Secondary | ICD-10-CM | POA: Diagnosis not present

## 2022-12-28 DIAGNOSIS — M5416 Radiculopathy, lumbar region: Secondary | ICD-10-CM

## 2022-12-28 DIAGNOSIS — Z0001 Encounter for general adult medical examination with abnormal findings: Secondary | ICD-10-CM | POA: Diagnosis not present

## 2022-12-28 DIAGNOSIS — M4802 Spinal stenosis, cervical region: Secondary | ICD-10-CM

## 2022-12-28 DIAGNOSIS — E785 Hyperlipidemia, unspecified: Secondary | ICD-10-CM | POA: Diagnosis not present

## 2022-12-28 DIAGNOSIS — I10 Essential (primary) hypertension: Secondary | ICD-10-CM

## 2022-12-28 MED ORDER — SILDENAFIL CITRATE 100 MG PO TABS: 50.0000 mg | ORAL_TABLET | Freq: Every day | ORAL | 11 refills | Status: DC | PRN

## 2022-12-28 MED ORDER — CLOPIDOGREL BISULFATE 75 MG PO TABS: ORAL_TABLET | ORAL | 2 refills | Status: DC

## 2022-12-28 MED ORDER — ROSUVASTATIN CALCIUM 40 MG PO TABS: ORAL_TABLET | ORAL | 2 refills | Status: DC

## 2022-12-28 MED ORDER — TRELEGY ELLIPTA 100-62.5-25 MCG/ACT IN AEPB: 1.0000 | INHALATION_SPRAY | Freq: Every day | RESPIRATORY_TRACT | 2 refills | Status: DC

## 2022-12-28 MED ORDER — PANTOPRAZOLE SODIUM 40 MG PO TBEC: DELAYED_RELEASE_TABLET | ORAL | 3 refills | Status: DC

## 2022-12-28 NOTE — Progress Notes (Signed)
Subjective:  Patient ID: Daniel [name], male    DOB: 03-18-1956  Age: 67 y.o. MRN: [id number]  CC: Annual Exam   HPI Daniel [name] presents for Annual physical.   L4-5 fusion on 09/09/22. Off pain meds except for baclofen     12/28/2022    2:21 PM 11/18/2022   11:09 AM 11/11/2022    9:54 AM  Depression screen PHQ 2/9  Decreased Interest 0 0 0  Down, Depressed, Hopeless 0 0 0  PHQ - 2 Score 0 0 0    History Daniel [name] has a past medical history of Acute ischemic stroke ([place]) (03/12/2018), Anesthesia complication, BPH (benign prostatic hyperplasia), COPD (chronic obstructive pulmonary disease) ([place]), Hyperlipidemia, Hypertension, Pulmonary nodule, renal stents, Stroke ([place]), and TIA (transient ischemic attack) (03/11/2018).   He has a past surgical history that includes renal stents; Cervical fusion; Rotator cuff repair (Left, 12/2021); Pterygium excision (2020); and Transforaminal lumbar interbody fusion (tlif) with pedicle screw fixation 1 level (Right, 09/09/2022).   His family history includes Cancer in his father; Emphysema in his father; Lung cancer in his mother.He reports that he has quit smoking. His smoking use included cigarettes. He has a 42.00 pack-year smoking history. He has never used smokeless tobacco. He reports current alcohol use of about 21.0 standard drinks of alcohol per week. He reports that he does not use drugs.    ROS Review of Systems  Constitutional:  Negative for activity change, fatigue and unexpected weight change.  HENT:  Negative for congestion, ear pain, hearing loss, postnasal drip and trouble swallowing.   Eyes:  Negative for pain and visual disturbance.  Respiratory:  Negative for cough, chest tightness and shortness of breath.   Cardiovascular:  Negative for chest pain, palpitations and leg swelling.  Gastrointestinal:  Negative for abdominal distention, abdominal pain, blood in stool, constipation, diarrhea, nausea and vomiting.   Endocrine: Negative for cold intolerance, heat intolerance and polydipsia.  Genitourinary:  Negative for difficulty urinating, dysuria, flank pain, frequency and urgency.       E.D.  Musculoskeletal:  Positive for arthralgias (aches and pains), back pain and neck pain. Negative for joint swelling.  Skin:  Negative for color change, rash and wound.  Neurological:  Negative for dizziness, syncope, speech difficulty, weakness, light-headedness, numbness and headaches.  Hematological:  Does not bruise/bleed easily.  Psychiatric/Behavioral:  Negative for confusion, decreased concentration, dysphoric mood and sleep disturbance. The patient is not nervous/anxious.     Objective:  BP (!) 97/58   Pulse 95   Temp 97.7 F (36.5 C)   Ht 5' 10.5" (1.791 m)   Wt 193 lb (87.5 kg)   SpO2 95%   BMI 27.30 kg/m   BP Readings from Last 3 Encounters:  12/28/22 (!) 97/58  11/18/22 107/63  11/11/22 (!) 93/53    Wt Readings from Last 3 Encounters:  12/28/22 193 lb (87.5 kg)  11/18/22 191 lb 6.4 oz (86.8 kg)  11/11/22 191 lb 3.2 oz (86.7 kg)     Physical Exam Constitutional:      Appearance: He is well-developed.  HENT:     Head: Normocephalic and atraumatic.  Eyes:     Pupils: Pupils are equal, round, and reactive to light.  Neck:     Thyroid: No thyromegaly.     Trachea: No tracheal deviation.  Cardiovascular:     Rate and Rhythm: Normal rate and regular rhythm.     Heart sounds: Normal heart sounds. No murmur heard.    No friction rub.  No gallop.  Pulmonary:     Breath sounds: Normal breath sounds. No wheezing or rales.  Abdominal:     General: Bowel sounds are normal. There is no distension.     Palpations: Abdomen is soft. There is no mass.     Tenderness: There is no abdominal tenderness.     Hernia: There is no hernia in the left inguinal area.  Genitourinary:    Penis: Normal.      Testes: Normal.  Musculoskeletal:        General: Normal range of motion.     Cervical  back: Normal range of motion.  Lymphadenopathy:     Cervical: No cervical adenopathy.  Skin:    General: Skin is warm and dry.  Neurological:     Mental Status: He is alert and oriented to person, place, and time.       Assessment & Plan:   Masashi was seen today for annual exam.  Diagnoses and all orders for this visit:  Well adult exam  Erectile dysfunction, unspecified erectile dysfunction type  Primary hypertension  Hyperlipidemia, unspecified hyperlipidemia type  Chronic obstructive pulmonary disease, unspecified COPD type ([place])  Cervical stenosis of spine  Radiculopathy, lumbar region  Other orders -     clopidogrel (PLAVIX) 75 MG tablet; TAKE 1 TABLET(75 MG) BY MOUTH DAILY -     Fluticasone-Umeclidin-Vilant (TRELEGY ELLIPTA) 100-62.5-25 MCG/ACT AEPB; Inhale 1 puff into the lungs daily. -     rosuvastatin (CRESTOR) 40 MG tablet; TAKE 1 TABLET(40 MG) BY MOUTH DAILY FOR CHOLESTEROL -     pantoprazole (PROTONIX) 40 MG tablet; TAKE 1 TABLET(40 MG) BY MOUTH DAILY FOR STOMACH       I have discontinued Daniel [name] "CHIZ"'s HYDROcodone-acetaminophen and methocarbamol. I am also having him maintain his SUPER B COMPLEX/C PO, GLUCOSAMINE-CHONDROITIN PO, albuterol, baclofen, metoprolol succinate, mometasone, clopidogrel, Trelegy Ellipta, rosuvastatin, and pantoprazole.  Allergies as of 12/28/2022   No Known Allergies      Medication List        Accurate as of December 28, 2022  3:05 PM. If you have any questions, ask your nurse or doctor.          STOP taking these medications    HYDROcodone-acetaminophen 5-325 MG tablet Commonly known as: NORCO/VICODIN Stopped by: Daniel Fraise, MD   methocarbamol 500 MG tablet Commonly known as: ROBAXIN Stopped by: Daniel Fraise, MD       TAKE these medications    albuterol 108 (90 Base) MCG/ACT inhaler Commonly known as: VENTOLIN HFA Inhale 2 puffs into the lungs every 6 (six) hours as needed for  wheezing or shortness of breath.   baclofen 10 MG tablet Commonly known as: LIORESAL Take 1 tablet (10 mg total) by mouth 3 (three) times daily.   clopidogrel 75 MG tablet Commonly known as: PLAVIX TAKE 1 TABLET(75 MG) BY MOUTH DAILY   GLUCOSAMINE-CHONDROITIN PO Take 2 tablets by mouth daily. With Vit D   metoprolol succinate 25 MG 24 hr tablet Commonly known as: TOPROL-XL Take 1 tablet (25 mg total) by mouth daily.   mometasone 0.1 % cream Commonly known as: Elocon Apply 1 Application topically daily. To affected areas   pantoprazole 40 MG tablet Commonly known as: PROTONIX TAKE 1 TABLET(40 MG) BY MOUTH DAILY FOR STOMACH   rosuvastatin 40 MG tablet Commonly known as: CRESTOR TAKE 1 TABLET(40 MG) BY MOUTH DAILY FOR CHOLESTEROL   SUPER B COMPLEX/C PO Take 1 tablet by mouth daily.   Trelegy Ellipta 100-62.5-25  MCG/ACT Aepb Generic drug: Fluticasone-Umeclidin-Vilant Inhale 1 puff into the lungs daily.         Follow-up: Return in about 6 months (around 06/28/2023).  Daniel [name], M.D.

## 2022-12-28 NOTE — Patient Instructions (Signed)

## 2023-01-01 ENCOUNTER — Ambulatory Visit (INDEPENDENT_AMBULATORY_CARE_PROVIDER_SITE_OTHER): Payer: Medicare HMO

## 2023-01-01 VITALS — Ht 70.0 in | Wt 196.0 lb

## 2023-01-01 DIAGNOSIS — Z0000 Encounter for general adult medical examination without abnormal findings: Secondary | ICD-10-CM | POA: Diagnosis not present

## 2023-01-01 NOTE — Progress Notes (Signed)
Subjective:   Daniel [name] is a 67 y.o. male who presents for an Initial Medicare Annual Wellness Visit. I connected with  Carola Rhine on 01/01/23 by a audio enabled telemedicine application and verified that I am speaking with the correct person using two identifiers.  Patient Location: Home  Provider Location: Home Office  I discussed the limitations of evaluation and management by telemedicine. The patient expressed understanding and agreed to proceed.  Review of Systems     Cardiac Risk Factors include: advanced age (>75mn, >>36women);male gender;hypertension     Objective:    Today's Vitals   01/01/23 0947  Weight: 196 lb (88.9 kg)  Height: '5\' 10"'$  (1.778 m)   Body mass index is 28.12 kg/m.     01/01/2023    9:58 AM 11/03/2022    7:32 PM 11/03/2022    9:02 AM 08/31/2022   11:10 AM 03/28/2018    6:05 PM 03/11/2018    5:00 AM  Advanced Directives  Does Patient Have a Medical Advance Directive? No  No No No No  Would patient like information on creating a medical advance directive? No - Patient declined No - Patient declined  Yes (MAU/Ambulatory/Procedural Areas - Information given)  No - Patient declined    Current Medications (verified) Outpatient Encounter Medications as of 01/01/2023  Medication Sig   albuterol (VENTOLIN HFA) 108 (90 Base) MCG/ACT inhaler Inhale 2 puffs into the lungs every 6 (six) hours as needed for wheezing or shortness of breath.   baclofen (LIORESAL) 10 MG tablet Take 1 tablet (10 mg total) by mouth 3 (three) times daily.   clopidogrel (PLAVIX) 75 MG tablet TAKE 1 TABLET(75 MG) BY MOUTH DAILY   Fluticasone-Umeclidin-Vilant (TRELEGY ELLIPTA) 100-62.5-25 MCG/ACT AEPB Inhale 1 puff into the lungs daily.   GLUCOSAMINE-CHONDROITIN PO Take 2 tablets by mouth daily. With Vit D   metoprolol succinate (TOPROL-XL) 25 MG 24 hr tablet Take 1 tablet (25 mg total) by mouth daily.   mometasone (ELOCON) 0.1 % cream Apply 1 Application topically daily.  To affected areas   pantoprazole (PROTONIX) 40 MG tablet TAKE 1 TABLET(40 MG) BY MOUTH DAILY FOR STOMACH   rosuvastatin (CRESTOR) 40 MG tablet TAKE 1 TABLET(40 MG) BY MOUTH DAILY FOR CHOLESTEROL   sildenafil (VIAGRA) 100 MG tablet Take 0.5-1 tablets (50-100 mg total) by mouth daily as needed for erectile dysfunction (sex).   SUPER B COMPLEX/C PO Take 1 tablet by mouth daily.   No facility-administered encounter medications on file as of 01/01/2023.    Allergies (verified) Patient has no known allergies.   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   BPH (benign prostatic hyperplasia)    COPD (chronic obstructive pulmonary disease) (HCC)    Hyperlipidemia    Hypertension    Pulmonary nodule    renal stents    Stroke ([facility]    TIA (transient ischemic attack) 03/11/2018   Past Surgical History:  Procedure Laterality Date   CERVICAL FUSION     PTERYGIUM EXCISION  2020   x2   renal stents     ROTATOR CUFF REPAIR Left 12/2021   TRANSFORAMINAL LUMBAR INTERBODY FUSION (TLIF) WITH PEDICLE SCREW FIXATION 1 LEVEL Right 09/09/2022   Procedure: RIGHT-SIDED LUMBAR FOUR THROUGH LUMBAR FIVE TRANSFORAMINAL LUMBAR INTERBODY FUSION AND DECOMPRESSION WITH INSTRUMENTATION AND ALLOGRAFT;  Surgeon: DPhylliss Bob MD;  Location: [facility]  Service: Orthopedics;  Laterality: Right;   Family History  Problem Relation Age of Onset   Emphysema Father    Cancer Father    Lung cancer Mother    Social History   Socioeconomic History   Marital status: Married    Spouse name: Not on file   Number of children: Not on file   Years of education: Not on file   Highest education level: Not on file  Occupational History   Occupation: builder  Tobacco Use   Smoking status: Former    Packs/day: 1.00    Years: 42.00    Total pack years: 42.00    Types:  Cigarettes   Smokeless tobacco: Never  Vaping Use   Vaping Use: Never used  Substance and Sexual Activity   Alcohol use: Yes    Alcohol/week: 21.0 standard drinks of alcohol    Types: 21 Shots of liquor per week    Comment: 3-3oz shots of bourbon a day   Drug use: Never   Sexual activity: Not on file  Other Topics Concern   Not on file  Social History Narrative   Not on file   Social Determinants of Health   Financial Resource Strain: Low Risk  (01/01/2023)   Overall Financial Resource Strain (CARDIA)    Difficulty of Paying Living Expenses: Not hard at all  Food Insecurity: No Food Insecurity (01/01/2023)   Hunger Vital Sign    Worried About Running Out of Food in the Last Year: Never true    Ran Out of Food in the Last Year: Never true  Transportation Needs: No Transportation Needs (01/01/2023)   PRAPARE - Hydrologist (Medical): No    Lack of Transportation (Non-Medical): No  Physical Activity: Sufficiently Active (01/01/2023)   Exercise Vital Sign    Days of Exercise per Week: 5 days    Minutes of Exercise per Session: 30 min  Stress: No Stress Concern Present (01/01/2023)   [place] of Stress : Not at all  Social Connections: [place] (01/01/2023)   Social Connection and Isolation Panel [NHANES]    Frequency of Communication with Friends and Family: More than three times a week    Frequency of Social Gatherings with Friends and Family: More than three times a week    Attends Religious Services: More than 4 times per year    Active Member of [employer] or Organizations: Yes    Attends Music therapist: More than 4 times per year    Marital Status: Married    Tobacco Counseling Counseling given: Not Answered   Clinical Intake:  Pre-visit preparation completed: Yes  Pain : No/denies pain     Nutritional Risks: None Diabetes: No  How often do  you need to have someone help you when you read instructions, pamphlets, or other written materials from your doctor or pharmacy?: 1 - Never  Diabetic?no   Interpreter Needed?: No  Information entered by :: Jadene Pierini, LPN   Activities of Daily Living    01/01/2023    9:55 AM 12/31/2022   10:59 AM  In your present state of health, do you have any difficulty performing the following activities:  Hearing? 0 0  Vision? 0 0  Difficulty concentrating or making decisions? 0 0  Walking or climbing stairs? 0 0  Dressing or bathing? 0 0  Doing errands, shopping? 0 0  Preparing Food and eating ?  N N  Using the Toilet? N N  In the past six months, have you accidently leaked urine? N N  Do you have problems with loss of bowel control? N N  Managing your Medications? N N  Managing your Finances? N N  Housekeeping or managing your Housekeeping? N N    Patient Care Team: [name], MD as PCP - General (Family Medicine)  Indicate any recent Medical Services you may have received from other than [facility] providers in the past year (date may be approximate).     Assessment:   This is a routine wellness examination for Gwyndolyn Saxon.  Hearing/Vision screen Vision Screening - Comments:: Wears rx glasses - up to date with routine eye exams with  Dr.Johnson   Dietary issues and exercise activities discussed: Current Exercise Habits: Home exercise routine, Type of exercise: walking, Time (Minutes): 30, Frequency (Times/Week): 5, Weekly Exercise (Minutes/Week): 150, Intensity: Mild, Exercise limited by: None identified   Goals Addressed             This Visit's Progress    DIET - INCREASE WATER INTAKE         Depression Screen    01/01/2023    9:50 AM 12/28/2022    2:21 PM 11/18/2022   11:09 AM 11/11/2022    9:54 AM 08/04/2022    9:31 AM 07/30/2022   10:27 AM 04/23/2022   10:00 AM  PHQ 2/9 Scores  PHQ - 2 Score 0 0 0 0 0 0 0  PHQ- 9 Score      0     Fall Risk    01/01/2023    9:48 AM  12/31/2022   10:59 AM 12/28/2022    2:21 PM 11/18/2022   11:09 AM 11/11/2022    9:54 AM  Fall Risk   Falls in the past year? 0 0 0 0 0  Number falls in past yr: 0 0     Injury with Fall? 0 0     Risk for fall due to : No Fall Risks      Follow up Falls prevention discussed        [place]:  Any stairs in or around the home? Yes  If so, are there any without handrails? No  Home free of loose throw rugs in walkways, pet beds, electrical cords, etc? Yes  Adequate lighting in your home to reduce risk of falls? Yes   ASSISTIVE DEVICES UTILIZED TO PREVENT FALLS:  Life alert? No  Use of a cane, walker or w/c? No  Grab bars in the bathroom? No  Shower chair or bench in shower? No  Elevated toilet seat or a handicapped toilet? No          01/01/2023    9:57 AM  6CIT Screen  What Year? 0 points  What month? 0 points  What time? 0 points  Count back from 20 0 points  Months in reverse 0 points  Repeat phrase 0 points  Total Score 0 points    Immunizations Immunization History  Administered Date(s) Administered   Influenza Inj Mdck Quad Pf 07/28/2019, 07/28/2019   Influenza,inj,Quad PF,6+ Mos 09/25/2015, 08/06/2016, 11/18/2017, 08/09/2018, 08/13/2020   PFIZER Comirnaty(Gray Top)Covid-19 Tri-Sucrose Vaccine 03/11/2021   PFIZER(Purple Top)SARS-COV-2 Vaccination 08/13/2020   Pneumococcal Conjugate-13 07/31/2019   Pneumococcal Polysaccharide-23 07/08/2021   Tdap 12/31/2020   Zoster Recombinat (Shingrix) 03/11/2021    TDAP status: Up to date  Flu Vaccine status: Up to date  Pneumococcal  vaccine status: Up to date  Covid-19 vaccine status: Completed vaccines  Qualifies for Shingles Vaccine? Yes   Zostavax completed No   Shingrix Completed?: No.    Education has been provided regarding the importance of this vaccine. Patient has been advised to call insurance company to determine out of pocket expense if they have not yet received this  vaccine. Advised may also receive vaccine at local pharmacy or Health Dept. Verbalized acceptance and understanding.  Screening Tests Health Maintenance  Topic Date Due   COLON CANCER SCREENING ANNUAL FOBT  11/02/2012   Lung Cancer Screening  12/27/2022   COVID-19 Vaccine (3 - Pfizer risk series) 01/13/2023 (Originally 04/08/2021)   INFLUENZA VACCINE  01/31/2023 (Originally 06/02/2022)   Zoster Vaccines- Shingrix (2 of 2) 02/10/2023 (Originally 05/06/2021)   Medicare Annual Wellness (AWV)  01/01/2024   COLONOSCOPY (Pts 45-59yr Insurance coverage will need to be confirmed)  06/05/2030   DTaP/Tdap/Td (2 - Td or Tdap) 01/01/2031   Pneumonia Vaccine 67 Years old  Completed   Hepatitis C Screening  Completed   HPV VACCINES  Aged Out    Health Maintenance  Health Maintenance Due  Topic Date Due   COLON CANCER SCREENING ANNUAL FOBT  11/02/2012   Lung Cancer Screening  12/27/2022    Colorectal cancer screening: Type of screening: Colonoscopy. Completed 06/05/2020. Repeat every 10 years  Lung Cancer Screening: (Low Dose CT Chest recommended if Age 569-80years, 30 pack-year currently smoking OR have quit w/in 15years.) does not qualify.   Lung Cancer Screening Referral: n/a  Additional Screening:  Hepatitis C Screening: does not qualify; Completed 07/04/2020  Vision Screening: Recommended annual ophthalmology exams for early detection of glaucoma and other disorders of the eye. Is the patient up to date with their annual eye exam?  Yes  Who is the provider or what is the name of the office in which the patient attends annual eye exams? Dr.Johnson  If pt is not established with a provider, would they like to be referred to a provider to establish care? No .   Dental Screening: Recommended annual dental exams for proper oral hygiene  Community Resource Referral / Chronic Care Management: CRR required this visit?  No   CCM required this visit?  No      Plan:     I have personally  reviewed and noted the following in the patient's chart:   Medical and social history Use of alcohol, tobacco or illicit drugs  Current medications and supplements including opioid prescriptions. Patient is not currently taking opioid prescriptions. Functional ability and status Nutritional status Physical activity Advanced directives List of other physicians Hospitalizations, surgeries, and ER visits in previous 12 months Vitals Screenings to include cognitive, depression, and falls Referrals and appointments  In addition, I have reviewed and discussed with patient certain preventive protocols, quality metrics, and best practice recommendations. A written personalized care plan for preventive services as well as general preventive health recommendations were provided to patient.     LDaphane Shepherd LPN   [id number]  Nurse Notes: none

## 2023-01-01 NOTE — Patient Instructions (Signed)
[name] , Thank you for taking time to come for your Medicare Wellness Visit. I appreciate your ongoing commitment to your health goals. Please review the following plan we discussed and let me know if I can assist you in the future.   These are the goals we discussed:  Goals      DIET - INCREASE WATER INTAKE        This is a list of the screening recommended for you and due dates:  Health Maintenance  Topic Date Due   Stool Blood Test  11/02/2012   Screening for Lung Cancer  12/27/2022   COVID-19 Vaccine (3 - Pfizer risk series) 01/13/2023*   Flu Shot  01/31/2023*   Zoster (Shingles) Vaccine (2 of 2) 02/10/2023*   Medicare Annual Wellness Visit  01/01/2024   Colon Cancer Screening  06/05/2030   DTaP/Tdap/Td vaccine (2 - Td or Tdap) 01/01/2031   Pneumonia Vaccine  Completed   Hepatitis C Screening: USPSTF Recommendation to screen - Ages 2-79 yo.  Completed   HPV Vaccine  Aged Out  *Topic was postponed. The date shown is not the original due date.    Advanced directives: Advance directive discussed with you today. I have provided a copy for you to complete at home and have notarized. Once this is complete please bring a copy in to our office so we can scan it into your chart.   Conditions/risks identified: Aim for 30 minutes of exercise or brisk walking, 6-8 glasses of water, and 5 servings of fruits and vegetables each day.   Next appointment: Follow up in one year for your annual wellness visit.   Preventive Care 18 Years and Older, Male  Preventive care refers to lifestyle choices and visits with your health care provider that can promote health and wellness. What does preventive care include? A yearly physical exam. This is also called an annual well check. Dental exams once or twice a year. Routine eye exams. Ask your health care provider how often you should have your eyes checked. Personal lifestyle choices, including: Daily care of your teeth and gums. Regular  physical activity. Eating a healthy diet. Avoiding tobacco and drug use. Limiting alcohol use. Practicing safe sex. Taking low doses of aspirin every day. Taking vitamin and mineral supplements as recommended by your health care provider. What happens during an annual well check? The services and screenings done by your health care provider during your annual well check will depend on your age, overall health, lifestyle risk factors, and family history of disease. Counseling  Your health care provider may ask you questions about your: Alcohol use. Tobacco use. Drug use. Emotional well-being. Home and relationship well-being. Sexual activity. Eating habits. History of falls. Memory and ability to understand (cognition). Work and work Statistician. Screening  You may have the following tests or measurements: Height, weight, and BMI. Blood pressure. Lipid and cholesterol levels. These may be checked every 5 years, or more frequently if you are over 62 years old. Skin check. Lung cancer screening. You may have this screening every year starting at age 69 if you have a 30-pack-year history of smoking and currently smoke or have quit within the past 15 years. Fecal occult blood test (FOBT) of the stool. You may have this test every year starting at age 60. Flexible sigmoidoscopy or colonoscopy. You may have a sigmoidoscopy every 5 years or a colonoscopy every 10 years starting at age 23. Prostate cancer screening. Recommendations will vary depending on your family  history and other risks. Hepatitis C blood test. Hepatitis B blood test. Sexually transmitted disease (STD) testing. Diabetes screening. This is done by checking your blood sugar (glucose) after you have not eaten for a while (fasting). You may have this done every 1-3 years. Abdominal aortic aneurysm (AAA) screening. You may need this if you are a current or former smoker. Osteoporosis. You may be screened starting at age 36  if you are at high risk. Talk with your health care provider about your test results, treatment options, and if necessary, the need for more tests. Vaccines  Your health care provider may recommend certain vaccines, such as: Influenza vaccine. This is recommended every year. Tetanus, diphtheria, and acellular pertussis (Tdap, Td) vaccine. You may need a Td booster every 10 years. Zoster vaccine. You may need this after age 58. Pneumococcal 13-valent conjugate (PCV13) vaccine. One dose is recommended after age 16. Pneumococcal polysaccharide (PPSV23) vaccine. One dose is recommended after age 24. Talk to your health care provider about which screenings and vaccines you need and how often you need them. This information is not intended to replace advice given to you by your health care provider. Make sure you discuss any questions you have with your health care provider. Document Released: 11/15/2015 Document Revised: 07/08/2016 Document Reviewed: 08/20/2015 Elsevier Interactive Patient Education  2017 [employer] Prevention in the Home Falls can cause injuries. They can happen to people of all ages. There are many things you can do to make your home safe and to help prevent falls. What can I do on the outside of my home? Regularly fix the edges of walkways and driveways and fix any cracks. Remove anything that might make you trip as you walk through a door, such as a raised step or threshold. Trim any bushes or trees on the path to your home. Use bright outdoor lighting. Clear any walking paths of anything that might make someone trip, such as rocks or tools. Regularly check to see if handrails are loose or broken. Make sure that both sides of any steps have handrails. Any raised decks and porches should have guardrails on the edges. Have any leaves, snow, or ice cleared regularly. Use sand or salt on walking paths during winter. Clean up any spills in your garage right away. This  includes oil or grease spills. What can I do in the bathroom? Use night lights. Install grab bars by the toilet and in the tub and shower. Do not use towel bars as grab bars. Use non-skid mats or decals in the tub or shower. If you need to sit down in the shower, use a plastic, non-slip stool. Keep the floor dry. Clean up any water that spills on the floor as soon as it happens. Remove soap buildup in the tub or shower regularly. Attach bath mats securely with double-sided non-slip rug tape. Do not have throw rugs and other things on the floor that can make you trip. What can I do in the bedroom? Use night lights. Make sure that you have a light by your bed that is easy to reach. Do not use any sheets or blankets that are too big for your bed. They should not hang down onto the floor. Have a firm chair that has side arms. You can use this for support while you get dressed. Do not have throw rugs and other things on the floor that can make you trip. What can I do in the kitchen? Clean up any  spills right away. Avoid walking on wet floors. Keep items that you use a lot in easy-to-reach places. If you need to reach something above you, use a strong step stool that has a grab bar. Keep electrical cords out of the way. Do not use floor polish or wax that makes floors slippery. If you must use wax, use non-skid floor wax. Do not have throw rugs and other things on the floor that can make you trip. What can I do with my stairs? Do not leave any items on the stairs. Make sure that there are handrails on both sides of the stairs and use them. Fix handrails that are broken or loose. Make sure that handrails are as long as the stairways. Check any carpeting to make sure that it is firmly attached to the stairs. Fix any carpet that is loose or worn. Avoid having throw rugs at the top or bottom of the stairs. If you do have throw rugs, attach them to the floor with carpet tape. Make sure that you  have a light switch at the top of the stairs and the bottom of the stairs. If you do not have them, ask someone to add them for you. What else can I do to help prevent falls? Wear shoes that: Do not have high heels. Have rubber bottoms. Are comfortable and fit you well. Are closed at the toe. Do not wear sandals. If you use a stepladder: Make sure that it is fully opened. Do not climb a closed stepladder. Make sure that both sides of the stepladder are locked into place. Ask someone to hold it for you, if possible. Clearly mark and make sure that you can see: Any grab bars or handrails. First and last steps. Where the edge of each step is. Use tools that help you move around (mobility aids) if they are needed. These include: Canes. Walkers. Scooters. Crutches. Turn on the lights when you go into a dark area. Replace any light bulbs as soon as they burn out. Set up your furniture so you have a clear path. Avoid moving your furniture around. If any of your floors are uneven, fix them. If there are any pets around you, be aware of where they are. Review your medicines with your doctor. Some medicines can make you feel dizzy. This can increase your chance of falling. Ask your doctor what other things that you can do to help prevent falls. This information is not intended to replace advice given to you by your health care provider. Make sure you discuss any questions you have with your health care provider. Document Released: 08/15/2009 Document Revised: 03/26/2016 Document Reviewed: 11/23/2014 Elsevier Interactive Patient Education  2017 [employer].

## 2023-02-28 IMAGING — DX DG CHEST 2V
2 series · 2 of 2 positions shown · non-contrast
Comparison: March 2018

CLINICAL DATA: Cough, dyspnea, COPD

EXAM:
CHEST - 2 VIEW

[chest pa]
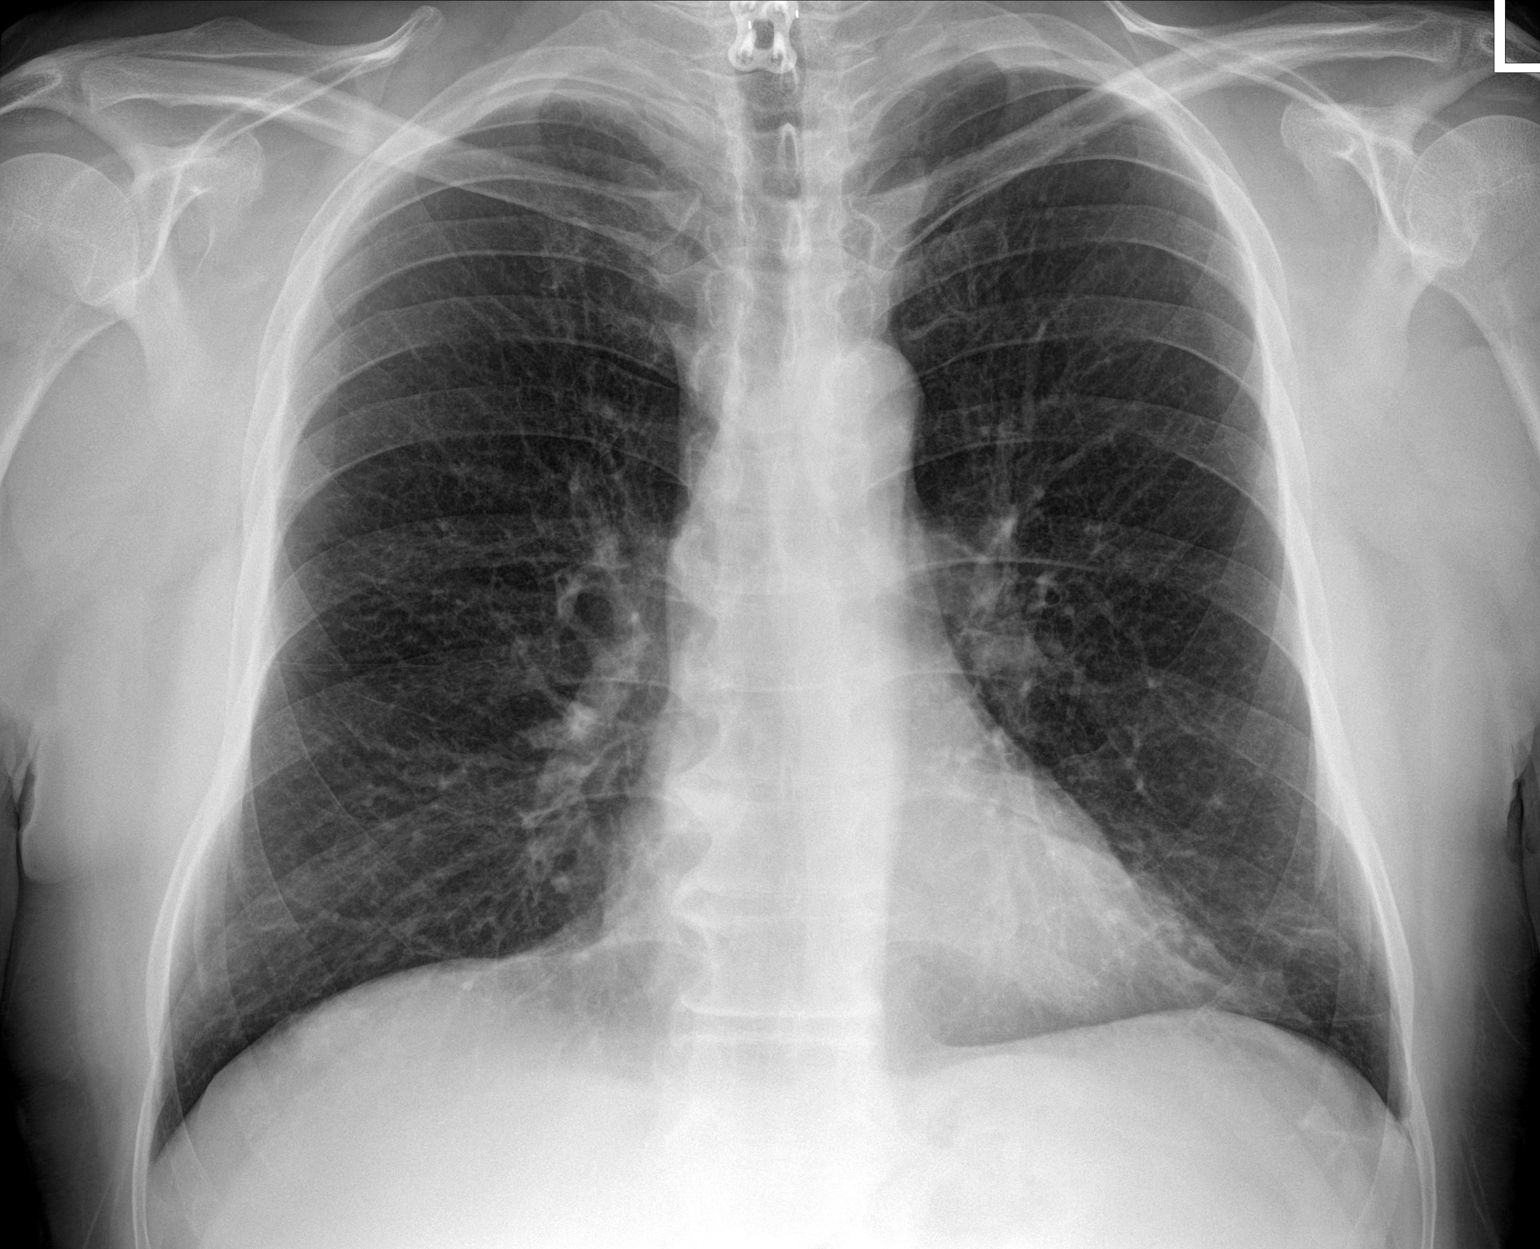

[chest lat]
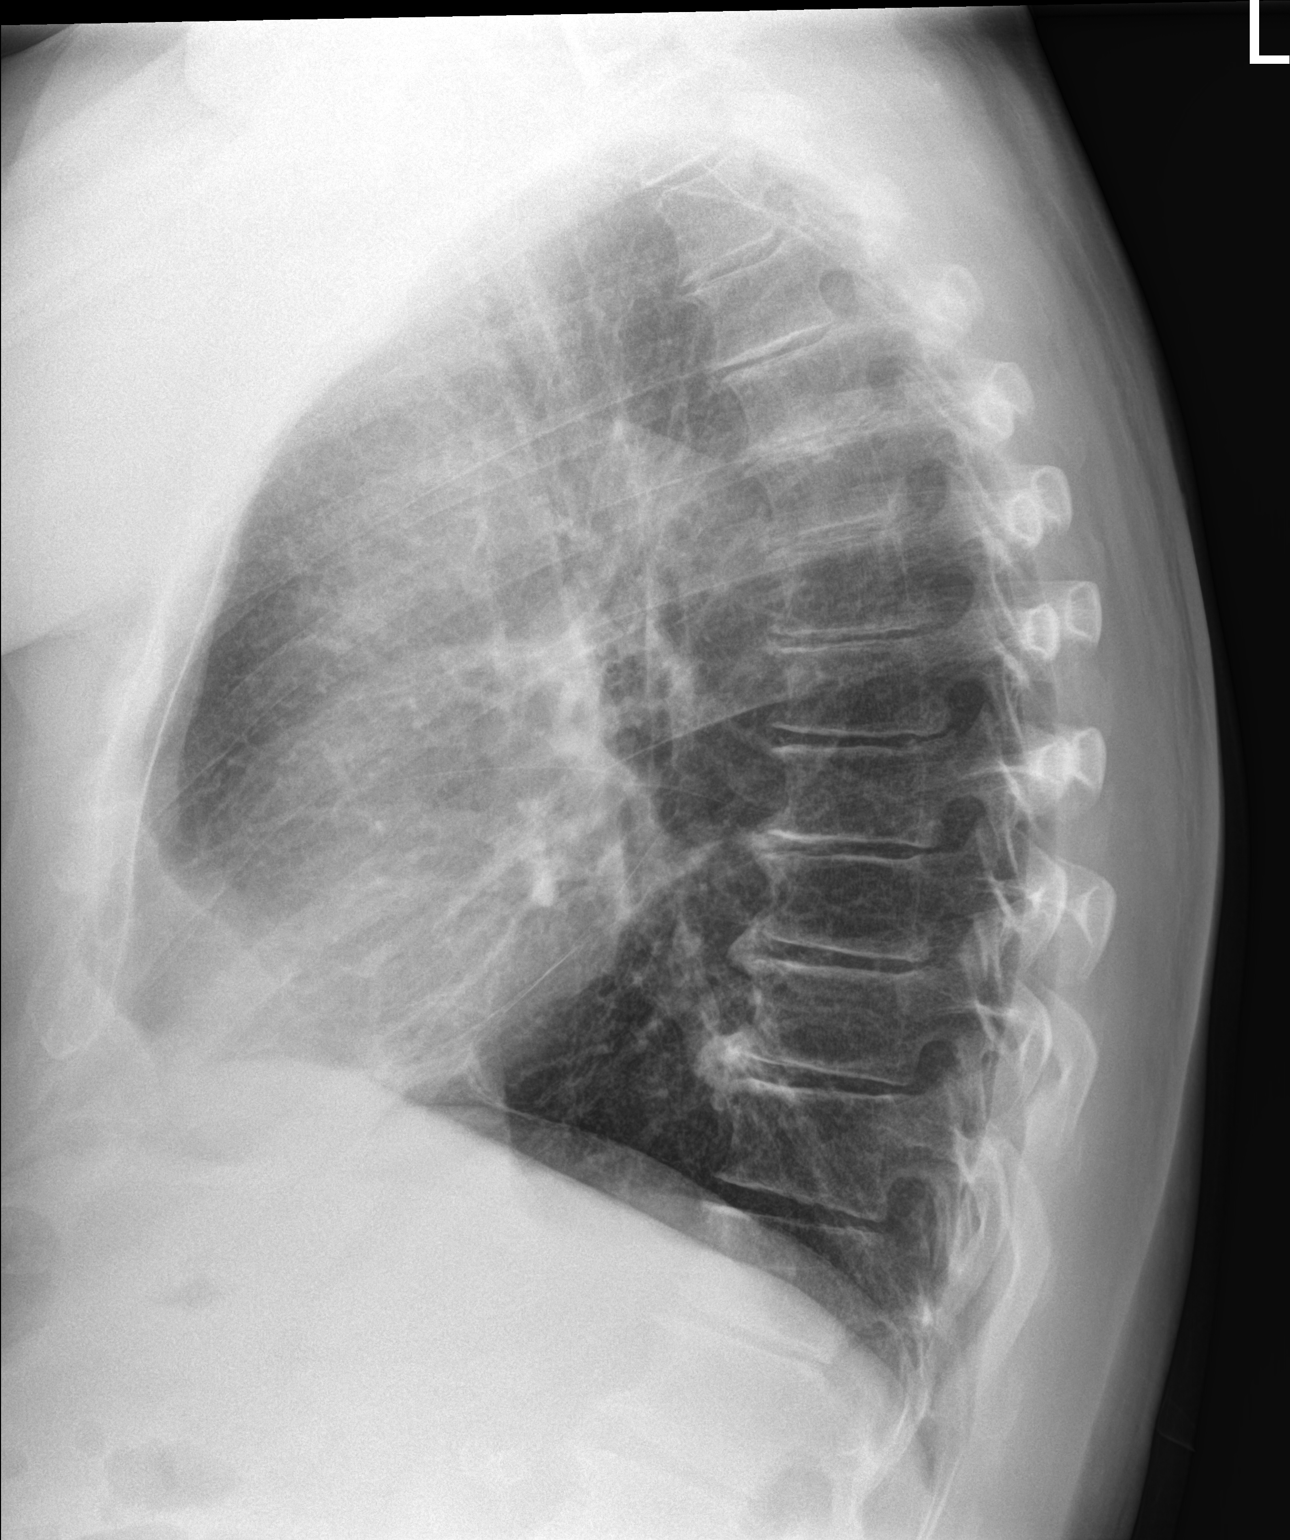

[2 of 2 positions shown; findings below may reference images not displayed]

FINDINGS: The heart size and mediastinal contours are within normal limits.
Chronic interstitial changes. No new consolidation. Linear scarring
left lung base. No acute osseous abnormality.
IMPRESSION: No acute process in the chest.

## 2023-03-01 DIAGNOSIS — M545 Low back pain, unspecified: Secondary | ICD-10-CM | POA: Diagnosis not present

## 2023-04-20 ENCOUNTER — Encounter (HOSPITAL_BASED_OUTPATIENT_CLINIC_OR_DEPARTMENT_OTHER): Payer: Self-pay

## 2023-04-20 ENCOUNTER — Emergency Department (HOSPITAL_BASED_OUTPATIENT_CLINIC_OR_DEPARTMENT_OTHER): Payer: Medicare HMO

## 2023-04-20 ENCOUNTER — Emergency Department (HOSPITAL_BASED_OUTPATIENT_CLINIC_OR_DEPARTMENT_OTHER)
Admission: EM | Admit: 2023-04-20 | Discharge: 2023-04-20 | Disposition: A | Discharge status: Home / Self Care | Payer: Medicare HMO | Attending: Emergency Medicine | Admitting: Emergency Medicine

## 2023-04-20 ENCOUNTER — Other Ambulatory Visit: Payer: Self-pay

## 2023-04-20 DIAGNOSIS — Z7901 Long term (current) use of anticoagulants: Secondary | ICD-10-CM | POA: Diagnosis not present

## 2023-04-20 DIAGNOSIS — R140 Abdominal distension (gaseous): Secondary | ICD-10-CM | POA: Insufficient documentation

## 2023-04-20 DIAGNOSIS — M545 Low back pain, unspecified: Secondary | ICD-10-CM | POA: Diagnosis not present

## 2023-04-20 DIAGNOSIS — W11XXXA Fall on and from ladder, initial encounter: Secondary | ICD-10-CM | POA: Diagnosis not present

## 2023-04-20 DIAGNOSIS — S32019A Unspecified fracture of first lumbar vertebra, initial encounter for closed fracture: Secondary | ICD-10-CM | POA: Insufficient documentation

## 2023-04-20 DIAGNOSIS — S3992XA Unspecified injury of lower back, initial encounter: Secondary | ICD-10-CM | POA: Diagnosis present

## 2023-04-20 DIAGNOSIS — S0990XA Unspecified injury of head, initial encounter: Secondary | ICD-10-CM | POA: Diagnosis not present

## 2023-04-20 DIAGNOSIS — M549 Dorsalgia, unspecified: Secondary | ICD-10-CM | POA: Diagnosis not present

## 2023-04-20 DIAGNOSIS — M542 Cervicalgia: Secondary | ICD-10-CM | POA: Diagnosis not present

## 2023-04-20 DIAGNOSIS — S32039A Unspecified fracture of third lumbar vertebra, initial encounter for closed fracture: Secondary | ICD-10-CM | POA: Diagnosis not present

## 2023-04-20 DIAGNOSIS — Z043 Encounter for examination and observation following other accident: Secondary | ICD-10-CM | POA: Diagnosis not present

## 2023-04-20 MED ORDER — ONDANSETRON HCL 4 MG/2ML IJ SOLN
4.0000 mg | Freq: Once | INTRAMUSCULAR | Status: AC
  Administered 2023-04-20: 4 mg via INTRAVENOUS
  Filled 2023-04-20: qty 2

## 2023-04-20 MED ORDER — HYDROCODONE-ACETAMINOPHEN 5-325 MG PO TABS: 1.0000 | ORAL_TABLET | Freq: Four times a day (QID) | ORAL | 0 refills | Status: DC | PRN

## 2023-04-20 MED ORDER — FENTANYL CITRATE PF 50 MCG/ML IJ SOSY
25.0000 ug | PREFILLED_SYRINGE | Freq: Once | INTRAMUSCULAR | Status: AC
  Administered 2023-04-20: 25 ug via INTRAVENOUS
  Filled 2023-04-20: qty 1

## 2023-04-20 MED ORDER — FENTANYL CITRATE PF 50 MCG/ML IJ SOSY
50.0000 ug | PREFILLED_SYRINGE | Freq: Once | INTRAMUSCULAR | Status: AC
  Administered 2023-04-20: 50 ug via INTRAVENOUS
  Filled 2023-04-20: qty 1

## 2023-04-20 NOTE — ED Provider Notes (Signed)
[place] EMERGENCY DEPARTMENT AT [facility] Provider Note   CSN: [id number] Arrival date & time: 04/20/23  1627     History  Chief Complaint  Patient presents with   Daniel [name] is a 67 y.o. male.  67 year old male presents today following a fall from ladder which was at 10 to 12 foot height.  He states he was trimming a limb when the ladder gave out from underneath him.  He states he fell backwards onto his feet before striking his back.  Complains of neck, and low back pain.  Reports a bruised to his right buttocks.  Otherwise denies abdominal pain, chest pain, any joint pain.  No head injury or loss of consciousness.  Not on anticoagulation.  He does take an antiplatelet Plavix.  The history is provided by the patient. No language interpreter was used.       Home Medications Prior to Admission medications   Medication Sig Start Date End Date Taking? Authorizing Provider  albuterol (VENTOLIN HFA) 108 (90 Base) MCG/ACT inhaler Inhale 2 puffs into the lungs every 6 (six) hours as needed for wheezing or shortness of breath.    [provider]  baclofen (LIORESAL) 10 MG tablet Take 1 tablet (10 mg total) by mouth 3 (three) times daily. 10/07/22   Ihor Austin, NP  clopidogrel (PLAVIX) 75 MG tablet TAKE 1 TABLET(75 MG) BY MOUTH DAILY 12/28/22   Mechele Claude, MD  Fluticasone-Umeclidin-Vilant (TRELEGY ELLIPTA) 100-62.5-25 MCG/ACT AEPB Inhale 1 puff into the lungs daily. 12/28/22   Mechele Claude, MD  GLUCOSAMINE-CHONDROITIN PO Take 2 tablets by mouth daily. With Vit D    [provider]  metoprolol succinate (TOPROL-XL) 25 MG 24 hr tablet Take 1 tablet (25 mg total) by mouth daily. 11/11/22   Mechele Claude, MD  mometasone (ELOCON) 0.1 % cream Apply 1 Application topically daily. To affected areas 11/18/22   Mechele Claude, MD  pantoprazole (PROTONIX) 40 MG tablet TAKE 1 TABLET(40 MG) BY MOUTH DAILY FOR STOMACH 12/28/22   Mechele Claude, MD   rosuvastatin (CRESTOR) 40 MG tablet TAKE 1 TABLET(40 MG) BY MOUTH DAILY FOR CHOLESTEROL 12/28/22   Mechele Claude, MD  sildenafil (VIAGRA) 100 MG tablet Take 0.5-1 tablets (50-100 mg total) by mouth daily as needed for erectile dysfunction (sex). 12/28/22   Mechele Claude, MD  SUPER B COMPLEX/C PO Take 1 tablet by mouth daily.    [provider]      Allergies    Patient has no known allergies.    Review of Systems   Review of Systems  Constitutional:  Negative for fever.  Respiratory:  Negative for shortness of breath.   Cardiovascular:  Negative for chest pain.  Gastrointestinal:  Negative for abdominal pain.  Musculoskeletal:  Positive for back pain and neck pain. Negative for arthralgias and myalgias.  All other systems reviewed and are negative.   Physical Exam Updated Vital Signs BP (!) 139/119 (BP Location: Right Arm)   Pulse 75   Temp 97.6 F (36.4 C) (Oral)   Resp 19   Ht 5\' 11"  (1.803 m)   Wt 85.7 kg   SpO2 100%   BMI 26.36 kg/m  Physical Exam Vitals and nursing note reviewed.  Constitutional:      General: He is not in acute distress.    Appearance: Normal appearance. He is not ill-appearing.  HENT:     Head: Normocephalic and atraumatic.     Nose: Nose normal.  Eyes:  Conjunctiva/sclera: Conjunctivae normal.  Cardiovascular:     Rate and Rhythm: Normal rate and regular rhythm.  Pulmonary:     Effort: Pulmonary effort is normal. No respiratory distress.  Abdominal:     General: There is distension (Mild distention.  Chronic according the patient.).     Tenderness: There is no abdominal tenderness. There is no guarding or rebound.  Musculoskeletal:        General: No deformity.     Comments: For range of motion bilateral upper and lower extremities.  Patient and family without much difficulty.  All major joints without tenderness palpation.  C-collar in place.  Thoracic and lumbar spine with some tenderness to palpation but no step-offs.   Skin:    Findings: No rash.     Comments: Small to moderate contusion noted to bottom of right buttock.  No tenderness to palpation, swelling, or hematoma at the site.  Neurological:     General: No focal deficit present.     Mental Status: He is alert.     ED Results / Procedures / Treatments   Labs (all labs ordered are listed, but only abnormal results are displayed) Labs Reviewed - No data to display  EKG None  Radiology No results found.  Procedures Procedures    Medications Ordered in ED Medications  ondansetron (ZOFRAN) injection 4 mg (has no administration in time range)  fentaNYL (SUBLIMAZE) injection 50 mcg (has no administration in time range)    ED Course/ Medical Decision Making/ A&P                             Medical Decision Making Amount and/or Complexity of Data Reviewed Radiology: ordered.  Risk Prescription drug management.   67 year old male presents following a fall.  He fell from a ladder at about 10 foot height.  Fell onto his feet then fell onto his back.  Tender over his lumbar spine.  C-collar in place.  Will obtain imaging.  Lumbar spine CT shows evidence of L1 and L3 acute body fractures as well as L1 spinous process fracture.  Discussed with neurosurgery Patrici Ranks, PA-C.  She recommends LSO brace and follow-up in clinic 4 weeks.  Will give patient pain control.  He is neurovascularly intact in bilateral lower extremities.  Patient is appropriate for discharge.  Discharged in stable condition peer return precaution discussed.  LSO brace provided.   Final Clinical Impression(s) / ED Diagnoses Final diagnoses:  Closed fracture of first lumbar vertebra, unspecified fracture morphology, initial encounter (HCC)  Closed fracture of third lumbar vertebra, unspecified fracture morphology, initial encounter [facility])    Rx / DC Orders ED Discharge Orders          Ordered    HYDROcodone-acetaminophen (NORCO/VICODIN) 5-325 MG tablet  Every  6 hours PRN        04/20/23 2003              Marita Kansas, PA-C 04/20/23 2042    Terrilee Files, MD 04/20/23 2153

## 2023-04-20 NOTE — Discharge Instructions (Addendum)
You have 2 fractures in your lumbar spine.  He received an LSO brace.  I spoke to neurosurgery about this.  They would like to see you in clinic in about 4 weeks.  Sooner if needed.  If you have any concerning or worsening symptoms such as profound weakness, loss of sensation, difficulty controlling your bowel or bladder please return to the emergency room.  Take Tylenol 1000 mg every 8 hours for pain control along with 600-800 mg of ibuprofen every 6-8 hours.  Take the pain medication as sent into the pharmacy for severe or breakthrough pain.

## 2023-04-20 NOTE — Progress Notes (Signed)
Orthopedic Tech Progress Note Patient Details:  Daniel [name] Mar 18, 1956 [id number]  Patient ID: Daniel [name], male   DOB: 15-Sep-1956, 67 y.o.   MRN: [id number] Brace ordered.  Daniel [name] 04/20/2023, 8:27 PM

## 2023-04-20 NOTE — ED Notes (Signed)
Pt verbalized understanding of d/c instructions, meds, and followup care. Denies questions. VSS, no distress noted. Steady gait to exit with all belongings.  ?

## 2023-04-20 NOTE — ED Notes (Signed)
Brace order via ortho. No ETA at this time.

## 2023-04-20 NOTE — ED Triage Notes (Signed)
Patient here POV from Home.  Endorses Falling off Ladder today at 1200. States being 10-12 Feet up. Larey Seat once the Ladder fell. No Head Injury or LOC. Takes Plavix.   Pain to Right Hip, Lower Back, Neck Area.  NAD Noted during Triage. A&Ox4. GCS 15. Ambulatory.

## 2023-04-22 DIAGNOSIS — M545 Low back pain, unspecified: Secondary | ICD-10-CM | POA: Diagnosis not present

## 2023-04-22 DIAGNOSIS — M542 Cervicalgia: Secondary | ICD-10-CM | POA: Diagnosis not present

## 2023-04-27 ENCOUNTER — Telehealth: Payer: Self-pay

## 2023-04-27 NOTE — Telephone Encounter (Addendum)
Transition Care Management Follow-up Telephone Call Date of discharge and from where: 04/20/2023 [facility] How have you been since you were released from the hospital? Patient is still in pain. Any questions or concerns? No  Items Reviewed: Did the pt receive and understand the discharge instructions provided? Yes  Medications obtained and verified? Yes  Other? No  Any new allergies since your discharge? No  Dietary orders reviewed? Yes Do you have support at home? Yes   Follow up appointments reviewed:  PCP Hospital f/u appt confirmed? No  Scheduled to see  on  @ . Specialist Hospital f/u appt confirmed?  Patient has seen Estill Bamberg, MD at [facility]  Scheduled to see  on  @ . Are transportation arrangements needed? No  If their condition worsens, is the pt aware to call PCP or go to the Emergency Dept.? Yes Was the patient provided with contact information for the PCP's office or ED? Yes Was to pt encouraged to call back with questions or concerns? Yes  Nivan Melendrez [facility]  [facility] Population Health Community Resource Care Guide   [email or web address]  ?? [phone]   Website: [email or web address]  [email or web address]

## 2023-05-21 DIAGNOSIS — M545 Low back pain, unspecified: Secondary | ICD-10-CM | POA: Diagnosis not present

## 2023-06-10 ENCOUNTER — Other Ambulatory Visit: Payer: Self-pay | Admitting: Family Medicine

## 2023-06-10 DIAGNOSIS — I10 Essential (primary) hypertension: Secondary | ICD-10-CM

## 2023-09-18 ENCOUNTER — Other Ambulatory Visit: Payer: Self-pay | Admitting: Family Medicine

## 2023-10-13 ENCOUNTER — Encounter: Payer: Self-pay | Admitting: Adult Health

## 2023-10-13 ENCOUNTER — Ambulatory Visit: Payer: Medicare HMO | Admitting: Adult Health

## 2023-10-13 VITALS — BP 126/72 | HR 63 | Ht 70.0 in | Wt 197.4 lb

## 2023-10-13 DIAGNOSIS — R252 Cramp and spasm: Secondary | ICD-10-CM | POA: Diagnosis not present

## 2023-10-13 DIAGNOSIS — Z8673 Personal history of transient ischemic attack (TIA), and cerebral infarction without residual deficits: Secondary | ICD-10-CM

## 2023-10-13 MED ORDER — BACLOFEN 10 MG PO TABS: 10.0000 mg | ORAL_TABLET | Freq: Three times a day (TID) | ORAL | 3 refills | Status: DC
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
## 2023-10-13 NOTE — Patient Instructions (Addendum)
Continue baclofen 10 mg 2-3 times daily as needed   Continue clopidogrel 75 mg daily  and Crestor for secondary stroke prevention  Continue to follow up with PCP regarding blood pressure and cholesterol management  Maintain strict control of hypertension with blood pressure goal below 130/90, diabetes with hemoglobin A1c goal below 7.0 % and cholesterol with LDL cholesterol (bad cholesterol) goal below 70 mg/dL.   Signs of a Stroke? Follow the BEFAST method:  Balance Watch for a sudden loss of balance, trouble with coordination or vertigo Eyes Is there a sudden loss of vision in one or both eyes? Or double vision?  Face: Ask the person to smile. Does one side of the face droop or is it numb?  Arms: Ask the person to raise both arms. Does one arm drift downward? Is there weakness or numbness of a leg? Speech: Ask the person to repeat a simple phrase. Does the speech sound slurred/strange? Is the person confused ? Time: If you observe any of these signs, call 911.        Thank you for coming to see [place] at [facility]. I hope we have been able to provide you high quality care today.  You may receive a patient satisfaction survey over the next few weeks. We would appreciate your feedback and comments so that we may continue to improve ourselves and the health of our patients.

## 2023-10-19 ENCOUNTER — Other Ambulatory Visit: Payer: Self-pay | Admitting: Family Medicine

## 2023-11-03 ENCOUNTER — Other Ambulatory Visit: Payer: Self-pay | Admitting: Family Medicine

## 2023-12-06 ENCOUNTER — Other Ambulatory Visit: Payer: Self-pay | Admitting: Family Medicine

## 2023-12-06 DIAGNOSIS — I150 Renovascular hypertension: Secondary | ICD-10-CM | POA: Diagnosis not present

## 2023-12-06 DIAGNOSIS — I701 Atherosclerosis of renal artery: Secondary | ICD-10-CM | POA: Diagnosis not present

## 2023-12-07 MED ORDER — ROSUVASTATIN CALCIUM 40 MG PO TABS: ORAL_TABLET | ORAL | 0 refills | Status: DC

## 2023-12-07 NOTE — Telephone Encounter (Signed)
PT SCHEDULED FIRST AVAILABLE APPT  ON FEB 25

## 2023-12-07 NOTE — Addendum Note (Signed)
Addended by: Julious Payer D on: 12/07/2023 10:02 AM   Modules accepted: Orders

## 2023-12-07 NOTE — Telephone Encounter (Signed)
Stacks pt NTBS 30-d given 11/04/23

## 2023-12-08 ENCOUNTER — Other Ambulatory Visit: Payer: Self-pay | Admitting: Family Medicine

## 2023-12-08 DIAGNOSIS — I10 Essential (primary) hypertension: Secondary | ICD-10-CM

## 2023-12-28 ENCOUNTER — Ambulatory Visit (INDEPENDENT_AMBULATORY_CARE_PROVIDER_SITE_OTHER): Payer: Medicare HMO | Admitting: Family Medicine

## 2023-12-28 ENCOUNTER — Encounter: Payer: Self-pay | Admitting: Family Medicine

## 2023-12-28 VITALS — BP 91/56 | HR 78 | Temp 97.5°F | Ht 66.0 in | Wt 196.0 lb

## 2023-12-28 DIAGNOSIS — E785 Hyperlipidemia, unspecified: Secondary | ICD-10-CM | POA: Diagnosis not present

## 2023-12-28 DIAGNOSIS — E559 Vitamin D deficiency, unspecified: Secondary | ICD-10-CM | POA: Diagnosis not present

## 2023-12-28 DIAGNOSIS — J449 Chronic obstructive pulmonary disease, unspecified: Secondary | ICD-10-CM | POA: Diagnosis not present

## 2023-12-28 DIAGNOSIS — I10 Essential (primary) hypertension: Secondary | ICD-10-CM | POA: Diagnosis not present

## 2023-12-28 DIAGNOSIS — Z125 Encounter for screening for malignant neoplasm of prostate: Secondary | ICD-10-CM | POA: Diagnosis not present

## 2023-12-28 MED ORDER — PANTOPRAZOLE SODIUM 40 MG PO TBEC: DELAYED_RELEASE_TABLET | ORAL | 3 refills | Status: DC

## 2023-12-28 MED ORDER — MOMETASONE FUROATE 0.1 % EX CREA: 1.0000 | TOPICAL_CREAM | Freq: Every day | CUTANEOUS | 5 refills | Status: AC

## 2023-12-28 MED ORDER — ROSUVASTATIN CALCIUM 40 MG PO TABS: ORAL_TABLET | ORAL | 3 refills | Status: AC

## 2023-12-28 MED ORDER — METOPROLOL SUCCINATE ER 25 MG PO TB24: 25.0000 mg | ORAL_TABLET | Freq: Every day | ORAL | 0 refills | Status: DC

## 2023-12-28 MED ORDER — TRELEGY ELLIPTA 100-62.5-25 MCG/ACT IN AEPB: 1.0000 | INHALATION_SPRAY | Freq: Every day | RESPIRATORY_TRACT | 3 refills | Status: DC

## 2023-12-28 MED ORDER — CLOPIDOGREL BISULFATE 75 MG PO TABS: 75.0000 mg | ORAL_TABLET | Freq: Every day | ORAL | 3 refills | Status: AC

## 2023-12-28 MED ORDER — MOMETASONE FUROATE 0.1 % EX CREA: 1.0000 | TOPICAL_CREAM | Freq: Every day | CUTANEOUS | 5 refills | Status: DC

## 2023-12-28 MED ORDER — BACLOFEN 10 MG PO TABS: 10.0000 mg | ORAL_TABLET | Freq: Three times a day (TID) | ORAL | 3 refills | Status: AC

## 2023-12-28 MED ORDER — METOPROLOL SUCCINATE ER 25 MG PO TB24: 25.0000 mg | ORAL_TABLET | Freq: Every day | ORAL | 3 refills | Status: DC

## 2023-12-28 MED ORDER — ROSUVASTATIN CALCIUM 40 MG PO TABS: ORAL_TABLET | ORAL | 0 refills | Status: DC

## 2023-12-28 MED ORDER — CLOPIDOGREL BISULFATE 75 MG PO TABS: ORAL_TABLET | ORAL | 0 refills | Status: DC

## 2023-12-28 NOTE — Progress Notes (Unsigned)
 Subjective:  Patient ID: Daniel [name], male    DOB: 1956/04/23  Age: 68 y.o. MRN: [id number]  CC: Medication Refill (Pended/States a med was cut down to 50 then 25 but doesn't know name.)   HPI Daniel [name] presents for follow-up of elevated cholesterol. Doing well without complaints on current medication. Denies side effects of statin including myalgia and arthralgia and nausea. Also in today for liver function testing. Currently no chest pain, shortness of breath or other cardiovascular related symptoms noted. History Daniel [name] has a past medical history of Acute ischemic stroke (HCC) (03/12/2018), Anesthesia complication, BPH (benign prostatic hyperplasia), COPD (chronic obstructive pulmonary disease) (HCC), Hyperlipidemia, Hypertension, Pulmonary nodule, renal stents, Stroke (HCC), and TIA (transient ischemic attack) (03/11/2018).   He has a past surgical history that includes renal stents; Cervical fusion; Rotator cuff repair (Left, 12/2021); Pterygium excision (2020); and Transforaminal lumbar interbody fusion (tlif) with pedicle screw fixation 1 level (Right, 09/09/2022).   His family history includes Cancer in his father; Emphysema in his father; Lung cancer in his mother.He reports that he has quit smoking. His smoking use included cigarettes. He has a 42 pack-year smoking history. He has never used smokeless tobacco. He reports current alcohol use of about 21.0 standard drinks of alcohol per week. He reports that he does not use drugs.  Current Outpatient Medications on File Prior to Visit  Medication Sig Dispense Refill  . albuterol (VENTOLIN HFA) 108 (90 Base) MCG/ACT inhaler Inhale 2 puffs into the lungs every 6 (six) hours as needed for wheezing or shortness of breath.    Marland Kitchen GLUCOSAMINE-CHONDROITIN PO Take 2 tablets by mouth daily. With Vit D    . SUPER B COMPLEX/C PO Take 1 tablet by mouth daily.     No current facility-administered medications on file prior to visit.     ROS Review of Systems  Objective:  BP (!) 91/56   Pulse 78   Temp (!) 97.5 F (36.4 C)   Ht 5\' 6"  (1.676 m)   Wt 196 lb (88.9 kg)   SpO2 94%   BMI 31.64 kg/m   BP Readings from Last 3 Encounters:  12/28/23 (!) 91/56  10/13/23 126/72  04/20/23 107/68    Wt Readings from Last 3 Encounters:  12/28/23 196 lb (88.9 kg)  10/13/23 197 lb 6.4 oz (89.5 kg)  04/20/23 189 lb (85.7 kg)     Physical Exam  Lab Results  Component Value Date   HGBA1C 5.3 03/11/2018    Lab Results  Component Value Date   WBC 6.2 12/21/2022   HGB 15.2 12/21/2022   HCT 44.4 12/21/2022   PLT 194 12/21/2022   GLUCOSE 77 12/21/2022   CHOL 153 12/21/2022   TRIG 90 12/21/2022   HDL 68 12/21/2022   LDLCALC 68 12/21/2022   ALT 22 12/21/2022   AST 27 12/21/2022   NA 143 12/21/2022   K 3.9 12/21/2022   CL 106 12/21/2022   CREATININE 1.12 12/21/2022   BUN 8 12/21/2022   CO2 19 (L) 12/21/2022   TSH 4.190 07/08/2021   PSA 0.2 07/18/2013   INR 1.00 03/10/2018   HGBA1C 5.3 03/11/2018    CT Thoracic Spine Wo Contrast Result Date: 04/20/2023 CLINICAL DATA:  Low back pain after trauma.  Fall from ladder EXAM: CT THORACIC AND LUMBAR SPINE WITHOUT CONTRAST TECHNIQUE: Multidetector CT imaging of the thoracic and lumbar spine was performed without contrast. Multiplanar CT image reconstructions were also generated. RADIATION DOSE REDUCTION: This exam was performed according to  the departmental dose-optimization program which includes automated exposure control, adjustment of the mA and/or kV according to patient size and/or use of iterative reconstruction technique. COMPARISON:  None Available. FINDINGS: CT THORACIC SPINE FINDINGS Alignment: No traumatic malalignment. Vertebrae: No acute fracture or incidental bone lesion. Paraspinal and other soft tissues: No perispinal hematoma. Disc levels: Generalized disc space narrowing with endplate and facet spurring. CT LUMBAR SPINE FINDINGS Segmentation: 5 lumbar  type vertebrae. Alignment: Normal Vertebrae: Acute appearing fractures involving the L1 and L3 bodies with mild superior endplate depression. No retropulsion. Subtle nondisplaced fracture through the L1 spinous process in a horizontal orientation best seen on coronal reformats. Paraspinal and other soft tissues: Mild paravertebral swelling around the patient's acute fractures. Disc levels: L4-5 PLIF. Generalized disc bulging and facet spurring. Notable spinal stenosis at L3-4 due to disc bulge and posterior element hypertrophy. IMPRESSION: CT THORACIC SPINE IMPRESSION No acute finding CT LUMBAR SPINE IMPRESSION 1. Acute L1 and L3 body fractures with mild superior endplate depression. 2. Nondisplaced L1 spinous process fracture. 3. Lumbar spine degeneration with L3-4 moderate or advanced spinal stenosis. Electronically Signed   By: Tiburcio Pea M.D.   On: 04/20/2023 18:44   CT Lumbar Spine Wo Contrast Result Date: 04/20/2023 CLINICAL DATA:  Low back pain after trauma.  Fall from ladder EXAM: CT THORACIC AND LUMBAR SPINE WITHOUT CONTRAST TECHNIQUE: Multidetector CT imaging of the thoracic and lumbar spine was performed without contrast. Multiplanar CT image reconstructions were also generated. RADIATION DOSE REDUCTION: This exam was performed according to the departmental dose-optimization program which includes automated exposure control, adjustment of the mA and/or kV according to patient size and/or use of iterative reconstruction technique. COMPARISON:  None Available. FINDINGS: CT THORACIC SPINE FINDINGS Alignment: No traumatic malalignment. Vertebrae: No acute fracture or incidental bone lesion. Paraspinal and other soft tissues: No perispinal hematoma. Disc levels: Generalized disc space narrowing with endplate and facet spurring. CT LUMBAR SPINE FINDINGS Segmentation: 5 lumbar type vertebrae. Alignment: Normal Vertebrae: Acute appearing fractures involving the L1 and L3 bodies with mild superior endplate  depression. No retropulsion. Subtle nondisplaced fracture through the L1 spinous process in a horizontal orientation best seen on coronal reformats. Paraspinal and other soft tissues: Mild paravertebral swelling around the patient's acute fractures. Disc levels: L4-5 PLIF. Generalized disc bulging and facet spurring. Notable spinal stenosis at L3-4 due to disc bulge and posterior element hypertrophy. IMPRESSION: CT THORACIC SPINE IMPRESSION No acute finding CT LUMBAR SPINE IMPRESSION 1. Acute L1 and L3 body fractures with mild superior endplate depression. 2. Nondisplaced L1 spinous process fracture. 3. Lumbar spine degeneration with L3-4 moderate or advanced spinal stenosis. Electronically Signed   By: Tiburcio Pea M.D.   On: 04/20/2023 18:44   CT Head Wo Contrast Result Date: 04/20/2023 CLINICAL DATA:  Blunt poly trauma.  Fall from ladder. EXAM: CT HEAD WITHOUT CONTRAST CT CERVICAL SPINE WITHOUT CONTRAST TECHNIQUE: Multidetector CT imaging of the head and cervical spine was performed following the standard protocol without intravenous contrast. Multiplanar CT image reconstructions of the cervical spine were also generated. RADIATION DOSE REDUCTION: This exam was performed according to the departmental dose-optimization program which includes automated exposure control, adjustment of the mA and/or kV according to patient size and/or use of iterative reconstruction technique. COMPARISON:  None Available. FINDINGS: CT HEAD FINDINGS Brain: No evidence of acute infarction, hemorrhage, hydrocephalus, extra-axial collection or mass lesion/mass effect. Generalized atrophy. Chronic small vessel ischemia in the cerebral white matter. Vascular: No hyperdense vessel or unexpected calcification. Skull: Normal. Negative  for fracture or focal lesion. Sinuses/Orbits: Negative CT CERVICAL SPINE FINDINGS Alignment: No traumatic malalignment Skull base and vertebrae: No acute fracture. C5-6 and C6-7 ACDF with solid  arthrodesis. Soft tissues and spinal canal: No prevertebral fluid or swelling. No visible canal hematoma. Disc levels: At the open levels there is generalized endplate and facet spurring but patent appearance of the spinal canal. Upper chest: Biapical emphysema. IMPRESSION: No evidence of acute intracranial or cervical spine injury. Electronically Signed   By: Tiburcio Pea M.D.   On: 04/20/2023 18:37   CT Cervical Spine Wo Contrast Result Date: 04/20/2023 CLINICAL DATA:  Blunt poly trauma.  Fall from ladder. EXAM: CT HEAD WITHOUT CONTRAST CT CERVICAL SPINE WITHOUT CONTRAST TECHNIQUE: Multidetector CT imaging of the head and cervical spine was performed following the standard protocol without intravenous contrast. Multiplanar CT image reconstructions of the cervical spine were also generated. RADIATION DOSE REDUCTION: This exam was performed according to the departmental dose-optimization program which includes automated exposure control, adjustment of the mA and/or kV according to patient size and/or use of iterative reconstruction technique. COMPARISON:  None Available. FINDINGS: CT HEAD FINDINGS Brain: No evidence of acute infarction, hemorrhage, hydrocephalus, extra-axial collection or mass lesion/mass effect. Generalized atrophy. Chronic small vessel ischemia in the cerebral white matter. Vascular: No hyperdense vessel or unexpected calcification. Skull: Normal. Negative for fracture or focal lesion. Sinuses/Orbits: Negative CT CERVICAL SPINE FINDINGS Alignment: No traumatic malalignment Skull base and vertebrae: No acute fracture. C5-6 and C6-7 ACDF with solid arthrodesis. Soft tissues and spinal canal: No prevertebral fluid or swelling. No visible canal hematoma. Disc levels: At the open levels there is generalized endplate and facet spurring but patent appearance of the spinal canal. Upper chest: Biapical emphysema. IMPRESSION: No evidence of acute intracranial or cervical spine injury. Electronically  Signed   By: Tiburcio Pea M.D.   On: 04/20/2023 18:37    Assessment & Plan:   Jax "[name]" was seen today for medication refill.  Diagnoses and all orders for this visit:  Vitamin D deficiency -     Vitamin D, 25-hydroxy  Hyperlipidemia, unspecified hyperlipidemia type -     CBC with Differential/Platelet -     CMP14+EGFR -     Lipid panel  Primary hypertension  Chronic obstructive pulmonary disease, unspecified COPD type (HCC) -     CBC with Differential/Platelet -     CMP14+EGFR -     Lipid panel -     PSA, total and free -     Vitamin D, 25-hydroxy  Prostate cancer screening -     PSA, total and free  Essential hypertension -     Discontinue: metoprolol succinate (TOPROL-XL) 25 MG 24 hr tablet; Take 1 tablet (25 mg total) by mouth daily. -     metoprolol succinate (TOPROL-XL) 25 MG 24 hr tablet; Take 1 tablet (25 mg total) by mouth daily.  Other orders -     Discontinue: clopidogrel (PLAVIX) 75 MG tablet; TAKE 1 TABLET(75 MG) BY MOUTH DAILY **NEEDS TO BE SEEN BEFORE NEXT REFILL** -     Discontinue: rosuvastatin (CRESTOR) 40 MG tablet; TAKE 1 TABLET(40 MG) BY MOUTH DAILY FOR CHOLESTEROL -     pantoprazole (PROTONIX) 40 MG tablet; TAKE 1 TABLET(40 MG) BY MOUTH DAILY FOR STOMACH -     Discontinue: mometasone (ELOCON) 0.1 % cream; Apply 1 Application topically daily. To affected areas -     baclofen (LIORESAL) 10 MG tablet; Take 1 tablet (10 mg total) by mouth 3 (three)  times daily. -     clopidogrel (PLAVIX) 75 MG tablet; Take 1 tablet (75 mg total) by mouth daily. TAKE 1 TABLET(75 MG) BY MOUTH -     rosuvastatin (CRESTOR) 40 MG tablet; TAKE 1 TABLET(40 MG) BY MOUTH DAILY FOR CHOLESTEROL -     mometasone (ELOCON) 0.1 % cream; Apply 1 Application topically daily. To affected areas -     Fluticasone-Umeclidin-Vilant (TRELEGY ELLIPTA) 100-62.5-25 MCG/ACT AEPB; Inhale 1 puff into the lungs daily.   I have discontinued Daniel Mccalls sildenafil,  HYDROcodone-acetaminophen, and clopidogrel. I have also changed his clopidogrel and Trelegy Ellipta. Additionally, I am having him maintain his SUPER B COMPLEX/C PO, GLUCOSAMINE-CHONDROITIN PO, albuterol, pantoprazole, baclofen, metoprolol succinate, rosuvastatin, and mometasone.  Meds ordered this encounter  Medications  . DISCONTD: clopidogrel (PLAVIX) 75 MG tablet    Sig: TAKE 1 TABLET(75 MG) BY MOUTH DAILY **NEEDS TO BE SEEN BEFORE NEXT REFILL**    Dispense:  30 tablet    Refill:  0  . DISCONTD: metoprolol succinate (TOPROL-XL) 25 MG 24 hr tablet    Sig: Take 1 tablet (25 mg total) by mouth daily.    Dispense:  90 tablet    Refill:  0  . DISCONTD: rosuvastatin (CRESTOR) 40 MG tablet    Sig: TAKE 1 TABLET(40 MG) BY MOUTH DAILY FOR CHOLESTEROL    Dispense:  30 tablet    Refill:  0  . pantoprazole (PROTONIX) 40 MG tablet    Sig: TAKE 1 TABLET(40 MG) BY MOUTH DAILY FOR STOMACH    Dispense:  90 tablet    Refill:  3  . DISCONTD: mometasone (ELOCON) 0.1 % cream    Sig: Apply 1 Application topically daily. To affected areas    Dispense:  45 g    Refill:  5  . baclofen (LIORESAL) 10 MG tablet    Sig: Take 1 tablet (10 mg total) by mouth 3 (three) times daily.    Dispense:  270 each    Refill:  3  . clopidogrel (PLAVIX) 75 MG tablet    Sig: Take 1 tablet (75 mg total) by mouth daily. TAKE 1 TABLET(75 MG) BY MOUTH    Dispense:  90 tablet    Refill:  3  . metoprolol succinate (TOPROL-XL) 25 MG 24 hr tablet    Sig: Take 1 tablet (25 mg total) by mouth daily.    Dispense:  90 tablet    Refill:  3  . rosuvastatin (CRESTOR) 40 MG tablet    Sig: TAKE 1 TABLET(40 MG) BY MOUTH DAILY FOR CHOLESTEROL    Dispense:  90 tablet    Refill:  3  . mometasone (ELOCON) 0.1 % cream    Sig: Apply 1 Application topically daily. To affected areas    Dispense:  45 g    Refill:  5  . Fluticasone-Umeclidin-Vilant (TRELEGY ELLIPTA) 100-62.5-25 MCG/ACT AEPB    Sig: Inhale 1 puff into the lungs daily.     Dispense:  180 each    Refill:  3     Follow-up: Return in about 6 months (around 06/26/2024) for [facility] physical.  Mechele Claude, M.D.

## 2023-12-29 LAB — CMP14+EGFR
ALT: 28 [IU]/L (ref 0–44)
AST: 32 [IU]/L (ref 0–40)
Albumin: 4.6 g/dL (ref 3.9–4.9)
Alkaline Phosphatase: 90 [IU]/L (ref 44–121)
BUN/Creatinine Ratio: 12 (ref 10–24)
BUN: 13 mg/dL (ref 8–27)
Bilirubin Total: 2.6 mg/dL — ABNORMAL HIGH (ref 0.0–1.2)
CO2: 20 mmol/L (ref 20–29)
Calcium: 9.4 mg/dL (ref 8.6–10.2)
Chloride: 103 mmol/L (ref 96–106)
Creatinine, Ser: 1.1 mg/dL (ref 0.76–1.27)
Globulin, Total: 2.2 g/dL (ref 1.5–4.5)
Glucose: 84 mg/dL (ref 70–99)
Potassium: 4.1 mmol/L (ref 3.5–5.2)
Sodium: 138 mmol/L (ref 134–144)
Total Protein: 6.8 g/dL (ref 6.0–8.5)
eGFR: 74 mL/min/{1.73_m2} (ref >59)

## 2023-12-29 LAB — CBC WITH DIFFERENTIAL/PLATELET
Basophils Absolute: 0 10*3/uL (ref 0.0–0.2)
Basos: 1 %
EOS (ABSOLUTE): 0.1 10*3/uL (ref 0.0–0.4)
Eos: 1 %
Hematocrit: 45.5 % (ref 37.5–51.0)
Hemoglobin: 15.7 g/dL (ref 13.0–17.7)
Immature Grans (Abs): 0 10*3/uL (ref 0.0–0.1)
Immature Granulocytes: 0 %
Lymphocytes Absolute: 1.3 10*3/uL (ref 0.7–3.1)
Lymphs: 18 %
MCH: 32.6 pg (ref 26.6–33.0)
MCHC: 34.5 g/dL (ref 31.5–35.7)
MCV: 94 fL (ref 79–97)
Monocytes Absolute: 0.6 10*3/uL (ref 0.1–0.9)
Monocytes: 8 %
Neutrophils Absolute: 5.4 10*3/uL (ref 1.4–7.0)
Neutrophils: 72 %
Platelets: 150 10*3/uL (ref 150–450)
RBC: 4.82 x10E6/uL (ref 4.14–5.80)
RDW: 12.8 % (ref 11.6–15.4)
WBC: 7.4 10*3/uL (ref 3.4–10.8)

## 2023-12-29 LAB — PSA, TOTAL AND FREE
PSA, Free Pct: 43.3 %
PSA, Free: 0.13 ng/mL
Prostate Specific Ag, Serum: 0.3 ng/mL (ref 0.0–4.0)

## 2023-12-29 LAB — LIPID PANEL
Chol/HDL Ratio: 2.2 {ratio} (ref 0.0–5.0)
Cholesterol, Total: 135 mg/dL (ref 100–199)
HDL: 62 mg/dL (ref >39)
LDL Chol Calc (NIH): 55 mg/dL (ref 0–99)
Triglycerides: 97 mg/dL (ref 0–149)
VLDL Cholesterol Cal: 18 mg/dL (ref 5–40)

## 2023-12-29 LAB — VITAMIN D 25 HYDROXY (VIT D DEFICIENCY, FRACTURES): Vit D, 25-Hydroxy: 38.7 ng/mL (ref 30.0–100.0)

## 2023-12-31 ENCOUNTER — Encounter: Payer: Self-pay | Admitting: Family Medicine

## 2024-01-03 ENCOUNTER — Other Ambulatory Visit: Payer: Self-pay | Admitting: Family Medicine

## 2024-01-03 ENCOUNTER — Encounter: Payer: Medicare HMO | Admitting: Family Medicine

## 2024-01-04 ENCOUNTER — Ambulatory Visit: Payer: Medicare HMO

## 2024-01-04 VITALS — BP 128/70 | Ht 70.5 in | Wt 196.0 lb

## 2024-01-04 DIAGNOSIS — Z0000 Encounter for general adult medical examination without abnormal findings: Secondary | ICD-10-CM | POA: Diagnosis not present

## 2024-01-04 NOTE — Progress Notes (Signed)
 Subjective:   Daniel [name] is a 68 y.o. who presents for a Medicare Wellness preventive visit.  Visit Complete: Virtual I connected with  Daniel [name] on 01/04/24 by a audio enabled telemedicine application and verified that I am speaking with the correct person using two identifiers.  Patient Location: Home  Provider Location: Home Office  I discussed the limitations of evaluation and management by telemedicine. The patient expressed understanding and agreed to proceed.  Vital Signs: Because this visit was a virtual/telehealth visit, some criteria may be missing or patient reported. Any vitals not documented were not able to be obtained and vitals that have been documented are patient reported.  VideoDeclined- This patient declined Librarian, academic. Therefore the visit was completed with audio only.  AWV Questionnaire: No: Patient Medicare AWV questionnaire was not completed prior to this visit.  Cardiac Risk Factors include: advanced age (>82men, >51 women);male gender     Objective:    Today's Vitals   01/04/24 1432  BP: 128/70  Weight: 196 lb (88.9 kg)  Height: 5' 10.5" (1.791 m)   Body mass index is 27.73 kg/m.     01/04/2024    2:45 PM 04/20/2023    4:40 PM 01/01/2023    9:58 AM 11/03/2022    7:32 PM 11/03/2022    9:02 AM 08/31/2022   11:10 AM 03/28/2018    6:05 PM  Advanced Directives  Does Patient Have a Medical Advance Directive? No No No  No No No  Would patient like information on creating a medical advance directive? Yes (MAU/Ambulatory/Procedural Areas - Information given) No - Patient declined No - Patient declined No - Patient declined  Yes (MAU/Ambulatory/Procedural Areas - Information given)     Current Medications (verified) Outpatient Encounter Medications as of 01/04/2024  Medication Sig   albuterol (VENTOLIN HFA) 108 (90 Base) MCG/ACT inhaler Inhale 2 puffs into the lungs every 6 (six) hours as needed for wheezing  or shortness of breath.   baclofen (LIORESAL) 10 MG tablet Take 1 tablet (10 mg total) by mouth 3 (three) times daily.   clopidogrel (PLAVIX) 75 MG tablet Take 1 tablet (75 mg total) by mouth daily. TAKE 1 TABLET(75 MG) BY MOUTH   Fluticasone-Umeclidin-Vilant (TRELEGY ELLIPTA) 100-62.5-25 MCG/ACT AEPB Inhale 1 puff into the lungs daily.   GLUCOSAMINE-CHONDROITIN PO Take 2 tablets by mouth daily. With Vit D   metoprolol succinate (TOPROL-XL) 25 MG 24 hr tablet Take 1 tablet (25 mg total) by mouth daily.   mometasone (ELOCON) 0.1 % cream Apply 1 Application topically daily. To affected areas   pantoprazole (PROTONIX) 40 MG tablet TAKE 1 TABLET(40 MG) BY MOUTH DAILY FOR STOMACH   rosuvastatin (CRESTOR) 40 MG tablet TAKE 1 TABLET(40 MG) BY MOUTH DAILY FOR CHOLESTEROL   SUPER B COMPLEX/C PO Take 1 tablet by mouth daily.   No facility-administered encounter medications on file as of 01/04/2024.    Allergies (verified) Patient has no known allergies.   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   BPH (benign prostatic hyperplasia)    COPD (chronic obstructive pulmonary disease) (HCC)    Hyperlipidemia    Hypertension    Pulmonary nodule    renal stents    Stroke [facility])    TIA (transient ischemic attack) 03/11/2018   Past Surgical History:  Procedure Laterality Date   CERVICAL FUSION  PTERYGIUM EXCISION  2020   x2   renal stents     ROTATOR CUFF REPAIR Left 12/2021   TRANSFORAMINAL LUMBAR INTERBODY FUSION (TLIF) WITH PEDICLE SCREW FIXATION 1 LEVEL Right 09/09/2022   Procedure: RIGHT-SIDED LUMBAR FOUR THROUGH LUMBAR FIVE TRANSFORAMINAL LUMBAR INTERBODY FUSION AND DECOMPRESSION WITH INSTRUMENTATION AND ALLOGRAFT;  Surgeon: Estill Bamberg, MD;  Location: [facility] OR;  Service: Orthopedics;  Laterality: Right;   Family History  Problem Relation Age  of Onset   Emphysema Father    Cancer Father    Lung cancer Mother    Social History   Socioeconomic History   Marital status: Married    Spouse name: Not on file   Number of children: Not on file   Years of education: Not on file   Highest education level: Associate degree: occupational, Scientist, product/process development, or vocational program  Occupational History   Occupation: Proofreader  Tobacco Use   Smoking status: Former    Current packs/day: 1.00    Average packs/day: 1 pack/day for 42.0 years (42.0 ttl pk-yrs)    Types: Cigarettes   Smokeless tobacco: Never  Vaping Use   Vaping status: Never Used  Substance and Sexual Activity   Alcohol use: Yes    Alcohol/week: 21.0 standard drinks of alcohol    Types: 21 Shots of liquor per week    Comment: 3-3oz shots of bourbon a day   Drug use: Never   Sexual activity: Not on file  Other Topics Concern   Not on file  Social History Narrative   Not on file   Social Drivers of Health   Financial Resource Strain: Low Risk  (01/04/2024)   Overall Financial Resource Strain (CARDIA)    Difficulty of Paying Living Expenses: Not hard at all  Food Insecurity: No Food Insecurity (01/04/2024)   Hunger Vital Sign    Worried About Running Out of Food in the Last Year: Never true    Ran Out of Food in the Last Year: Never true  Transportation Needs: No Transportation Needs (01/04/2024)   PRAPARE - Administrator, Civil Service (Medical): No    Lack of Transportation (Non-Medical): No  Physical Activity: Insufficiently Active (01/04/2024)   Exercise Vital Sign    Days of Exercise per Week: 2 days    Minutes of Exercise per Session: 30 min  Stress: No Stress Concern Present (01/04/2024)   [employer] of Occupational Health - Occupational Stress Questionnaire    Feeling of Stress : Not at all  Social Connections: Socially Integrated (01/04/2024)   Social Connection and Isolation Panel [NHANES]    Frequency of Communication with Friends and Family:  Three times a week    Frequency of Social Gatherings with Friends and Family: Once a week    Attends Religious Services: More than 4 times per year    Active Member of [employer] or Organizations: Yes    Attends Banker Meetings: 1 to 4 times per year    Marital Status: Married    Tobacco Counseling Counseling given: Not Answered    Clinical Intake:  Pre-visit preparation completed: Yes  Pain : No/denies pain     Diabetes: No  How often do you need to have someone help you when you read instructions, pamphlets, or other written materials from your doctor or pharmacy?: 1 - Never  Interpreter Needed?: No  Information entered by :: Kandis Fantasia LPN   Activities of Daily Living     01/04/2024    2:34 PM  In your present state of health, do you have any difficulty performing the following activities:  Hearing? 0  Vision? 0  Difficulty concentrating or making decisions? 0  Walking or climbing stairs? 0  Dressing or bathing? 0  Doing errands, shopping? 0  Preparing Food and eating ? N  Using the Toilet? N  In the past six months, have you accidently leaked urine? N  Do you have problems with loss of bowel control? N  Managing your Medications? N  Managing your Finances? N  Housekeeping or managing your Housekeeping? N    Patient Care Team: Mechele Claude, MD as PCP - General (Family Medicine) Estill Bamberg, MD as Consulting Physician (Orthopedic Surgery) [facility] [place], [place], Wyona Almas, MD as Referring Physician (Vascular Surgery)  Indicate any recent Medical Services you may have received from other than [facility] providers in the past year (date may be approximate).     Assessment:   This is a routine wellness examination for Dillion.  Hearing/Vision screen Hearing Screening - Comments:: Denies hearing difficulties   Vision Screening - Comments:: Wears rx glasses - up to date with routine eye exams with [facility]. [facility] Forster      Goals Addressed             This Visit's Progress    Remain active and independent         Depression Screen     01/04/2024    2:44 PM 01/01/2023    9:50 AM 12/28/2022    2:21 PM 11/18/2022   11:09 AM 11/11/2022    9:54 AM 08/04/2022    9:31 AM 07/30/2022   10:27 AM  PHQ 2/9 Scores  PHQ - 2 Score 0 0 0 0 0 0 0  PHQ- 9 Score       0    Fall Risk     01/04/2024    2:45 PM 01/01/2023    9:48 AM 12/31/2022   10:59 AM 12/28/2022    2:21 PM 11/18/2022   11:09 AM  Fall Risk   Falls in the past year? 0 0 0 0 0  Number falls in past yr: 0 0 0    Injury with Fall? 0 0 0    Risk for fall due to : No Fall Risks No Fall Risks     Follow up Falls prevention discussed;Education provided;Falls evaluation completed Falls prevention discussed       MEDICARE RISK AT HOME:  Medicare Risk at Home Any stairs in or around the home?: No If so, are there any without handrails?: No Home free of loose throw rugs in walkways, pet beds, electrical cords, etc?: Yes Adequate lighting in your home to reduce risk of falls?: Yes Life alert?: No Use of a cane, walker or w/c?: No Grab bars in the bathroom?: Yes Shower chair or bench in shower?: No Elevated toilet seat or a handicapped toilet?: Yes  TIMED UP AND GO:  Was the test performed?  No  Cognitive Function: 6CIT completed        01/04/2024    2:46 PM 01/01/2023    9:57 AM  6CIT Screen  What Year? 0 points 0 points  What month? 0 points 0 points  What time? 0 points 0 points  Count back from 20 0 points 0 points  Months in reverse 0 points 0 points  Repeat phrase 0 points 0 points  Total Score 0 points 0 points    Immunizations Immunization History  Administered Date(s)  Administered   Influenza Inj Mdck Quad Pf 07/28/2019, 07/28/2019   Influenza,inj,Quad PF,6+ Mos 09/25/2015, 08/06/2016, 11/18/2017, 08/09/2018, 08/13/2020   Influenza-Unspecified 09/28/2023   PFIZER Comirnaty(Gray Top)Covid-19 Tri-Sucrose Vaccine 03/11/2021    PFIZER(Purple Top)SARS-COV-2 Vaccination 08/13/2020   Pfizer Covid-19 Vaccine Bivalent Booster 86yrs & up 09/28/2023   Pneumococcal Conjugate-13 07/31/2019   Pneumococcal Polysaccharide-23 07/08/2021   Tdap 12/31/2020   Zoster Recombinant(Shingrix) 03/11/2021, 07/13/2021    Screening Tests Health Maintenance  Topic Date Due   COLON CANCER SCREENING ANNUAL FOBT  11/02/2012   Lung Cancer Screening  12/27/2022   COVID-19 Vaccine (4 - 2024-25 season) 01/13/2024 (Originally 11/23/2023)   Medicare Annual Wellness (AWV)  01/03/2025   Colonoscopy  06/05/2030   DTaP/Tdap/Td (2 - Td or Tdap) 01/01/2031   Pneumonia Vaccine 71+ Years old  Completed   INFLUENZA VACCINE  Completed   Hepatitis C Screening  Completed   Zoster Vaccines- Shingrix  Completed   HPV VACCINES  Aged Out    Health Maintenance  Health Maintenance Due  Topic Date Due   COLON CANCER SCREENING ANNUAL FOBT  11/02/2012   Lung Cancer Screening  12/27/2022    Additional Screening:  Vision Screening: Recommended annual ophthalmology exams for early detection of glaucoma and other disorders of the eye.  Dental Screening: Recommended annual dental exams for proper oral hygiene  Community Resource Referral / Chronic Care Management: CRR required this visit?  No   CCM required this visit?  No     Plan:     I have personally reviewed and noted the following in the patient's chart:   Medical and social history Use of alcohol, tobacco or illicit drugs  Current medications and supplements including opioid prescriptions. Patient is not currently taking opioid prescriptions. Functional ability and status Nutritional status Physical activity Advanced directives List of other physicians Hospitalizations, surgeries, and ER visits in previous 12 months Vitals Screenings to include cognitive, depression, and falls Referrals and appointments  In addition, I have reviewed and discussed with patient certain preventive  protocols, quality metrics, and best practice recommendations. A written personalized care plan for preventive services as well as general preventive health recommendations were provided to patient.     Kandis Fantasia [place], [place]   4/0/9811   After Visit Summary: (MyChart) Due to this being a telephonic visit, the after visit summary with patients personalized plan was offered to patient via MyChart   Notes: Nothing significant to report at this time.

## 2024-01-04 NOTE — Patient Instructions (Signed)
 Daniel [name] , Thank you for taking time to come for your Medicare Wellness Visit. I appreciate your ongoing commitment to your health goals. Please review the following plan we discussed and let me know if I can assist you in the future.   Referrals/Orders/Follow-Ups/Clinician Recommendations: Aim for 30 minutes of exercise or brisk walking, 6-8 glasses of water, and 5 servings of fruits and vegetables each day.  This is a list of the screening recommended for you and due dates:  Health Maintenance  Topic Date Due   Stool Blood Test  11/02/2012   Screening for Lung Cancer  12/27/2022   COVID-19 Vaccine (4 - 2024-25 season) 01/13/2024*   Medicare Annual Wellness Visit  01/03/2025   Colon Cancer Screening  06/05/2030   DTaP/Tdap/Td vaccine (2 - Td or Tdap) 01/01/2031   Pneumonia Vaccine  Completed   Flu Shot  Completed   Hepatitis C Screening  Completed   Zoster (Shingles) Vaccine  Completed   HPV Vaccine  Aged Out  *Topic was postponed. The date shown is not the original due date.    Advanced directives: (ACP Link)Information on Advanced Care Planning can be found at [facility] [place] Advance Health Care Directives Advance Health Care Directives ([email or web address]   Next Medicare Annual Wellness Visit scheduled for next year: Yes

## 2024-01-05 ENCOUNTER — Other Ambulatory Visit: Payer: Self-pay | Admitting: Family Medicine

## 2024-01-12 ENCOUNTER — Ambulatory Visit (HOSPITAL_BASED_OUTPATIENT_CLINIC_OR_DEPARTMENT_OTHER)
Admission: RE | Admit: 2024-01-12 | Discharge: 2024-01-12 | Disposition: A | Discharge status: Home / Self Care | Source: Ambulatory Visit | Attending: Family Medicine | Admitting: Family Medicine

## 2024-01-12 DIAGNOSIS — R1084 Generalized abdominal pain: Secondary | ICD-10-CM | POA: Diagnosis not present

## 2024-01-14 ENCOUNTER — Encounter: Payer: Self-pay | Admitting: Family Medicine

## 2024-01-20 ENCOUNTER — Encounter: Payer: Self-pay | Admitting: Family Medicine

## 2024-01-20 NOTE — Telephone Encounter (Signed)
 Called [facility] to get read done today

## 2024-01-21 DIAGNOSIS — M545 Low back pain, unspecified: Secondary | ICD-10-CM | POA: Diagnosis not present

## 2024-03-01 ENCOUNTER — Other Ambulatory Visit: Payer: Self-pay

## 2024-03-01 ENCOUNTER — Emergency Department (HOSPITAL_BASED_OUTPATIENT_CLINIC_OR_DEPARTMENT_OTHER)
Admission: EM | Admit: 2024-03-01 | Discharge: 2024-03-01 | Disposition: A | Discharge status: Home / Self Care | Attending: Emergency Medicine | Admitting: Emergency Medicine

## 2024-03-01 ENCOUNTER — Encounter (HOSPITAL_BASED_OUTPATIENT_CLINIC_OR_DEPARTMENT_OTHER): Payer: Self-pay | Admitting: *Deleted

## 2024-03-01 DIAGNOSIS — R21 Rash and other nonspecific skin eruption: Secondary | ICD-10-CM | POA: Diagnosis present

## 2024-03-01 DIAGNOSIS — L249 Irritant contact dermatitis, unspecified cause: Secondary | ICD-10-CM | POA: Insufficient documentation

## 2024-03-01 DIAGNOSIS — J449 Chronic obstructive pulmonary disease, unspecified: Secondary | ICD-10-CM | POA: Insufficient documentation

## 2024-03-01 MED ORDER — TRIAMCINOLONE ACETONIDE 0.5 % EX OINT: 1.0000 | TOPICAL_OINTMENT | Freq: Two times a day (BID) | CUTANEOUS | 0 refills | Status: DC

## 2024-03-01 MED ORDER — PREDNISONE 20 MG PO TABS: 40.0000 mg | ORAL_TABLET | Freq: Every day | ORAL | 0 refills | Status: DC

## 2024-03-01 MED ORDER — PREDNISONE 50 MG PO TABS
60.0000 mg | ORAL_TABLET | Freq: Once | ORAL | Status: AC
  Administered 2024-03-01: 60 mg via ORAL
  Filled 2024-03-01: qty 1

## 2024-03-01 MED ORDER — CEFADROXIL 500 MG PO CAPS: 500.0000 mg | ORAL_CAPSULE | Freq: Two times a day (BID) | ORAL | 0 refills | Status: DC

## 2024-03-01 NOTE — Discharge Instructions (Addendum)
 As we discussed I do not see any evidence of a severe drug reaction or diffuse infectious ideology at this time, given your history I think reasonable to have a printed prescription for a burst of steroids, antibiotics, as well as I printed a prescription for some steroid cream.  I would refrain from any additional triple antibiotic ointment as it is very common to have skin reactions to Neosporin, if you do have a Neosporin allergy the rash could be related to the antibiotic cream that they were rubbing on you while you were in [place].  If your symptoms worsen please return to a higher level care facility such as [facility], or preferably at [facility], [place], or [facility] where they have a dermatologist on-call

## 2024-03-01 NOTE — ED Provider Notes (Signed)
 [place] EMERGENCY DEPARTMENT AT [facility] Provider Note   CSN: [id number] Arrival date & time: 03/01/24  2146     History  Chief Complaint  Patient presents with   Other    Redness to skin     Daniel [name] is a 68 y.o. male with past medical history significant for COPD, hyperlipidemia, EtOH abuse who was recently in [place] and developed redness to his face and anterior chest as well as some irritation behind the ears.  He does not think that he had excessive sun exposure, they were treating him with some antibiotic cream there.  He denies any new medications, new foods or other products other than what was being applied to his face there.  He reports that he was very hot and sweaty throughout and there had been lots of irritation to the skin secondary to staying moist all day.  He is resistant to the idea that this could be related to some skin irritation from sunburn, heat rash, or dermatitis and feels very convinced that it is either infectious or a drug reaction.  HPI     Home Medications Prior to Admission medications   Medication Sig Start Date End Date Taking? Authorizing Provider  cefadroxil  (DURICEF) 500 MG capsule Take 1 capsule (500 mg total) by mouth 2 (two) times daily. 03/01/24  Yes Dana Dorner H, PA-C  predniSONE  (DELTASONE ) 20 MG tablet Take 2 tablets (40 mg total) by mouth daily. 03/01/24  Yes Waylyn Tenbrink H, PA-C  triamcinolone ointment (KENALOG) 0.5 % Apply 1 Application topically 2 (two) times daily. 03/01/24  Yes Immaculate Crutcher H, PA-C  albuterol  (VENTOLIN  HFA) 108 (90 Base) MCG/ACT inhaler Inhale 2 puffs into the lungs every 6 (six) hours as needed for wheezing or shortness of breath.    [provider]  baclofen  (LIORESAL ) 10 MG tablet Take 1 tablet (10 mg total) by mouth 3 (three) times daily. 12/28/23   Roise Cleaver, MD  clopidogrel  (PLAVIX ) 75 MG tablet Take 1 tablet (75 mg total) by mouth daily. TAKE 1 TABLET(75  MG) BY MOUTH 12/28/23   Roise Cleaver, MD  Fluticasone -Umeclidin-Vilant (TRELEGY ELLIPTA ) 100-62.5-25 MCG/ACT AEPB Inhale 1 puff into the lungs daily. 12/28/23   Roise Cleaver, MD  GLUCOSAMINE-CHONDROITIN PO Take 2 tablets by mouth daily. With Vit D    [provider]  metoprolol  succinate (TOPROL -XL) 25 MG 24 hr tablet Take 1 tablet (25 mg total) by mouth daily. 12/28/23   Roise Cleaver, MD  mometasone  (ELOCON ) 0.1 % cream Apply 1 Application topically daily. To affected areas 12/28/23   Roise Cleaver, MD  pantoprazole  (PROTONIX ) 40 MG tablet TAKE 1 TABLET(40 MG) BY MOUTH DAILY FOR STOMACH 12/28/23   Roise Cleaver, MD  rosuvastatin  (CRESTOR ) 40 MG tablet TAKE 1 TABLET(40 MG) BY MOUTH DAILY FOR CHOLESTEROL 12/28/23   Roise Cleaver, MD  SUPER B COMPLEX/C PO Take 1 tablet by mouth daily.    [provider]      Allergies    Patient has no known allergies.    Review of Systems   Review of Systems  All other systems reviewed and are negative.   Physical Exam Updated Vital Signs BP 135/70 (BP Location: Right Arm)   Pulse 80   Temp 98.5 F (36.9 C) (Oral)   Resp 18   Ht 5\' 10"  (1.778 m)   Wt 88.5 kg   SpO2 99%   BMI 27.98 kg/m  Physical Exam Vitals and nursing note reviewed.  Constitutional:  General: He is not in acute distress.    Appearance: Normal appearance.  HENT:     Head: Normocephalic and atraumatic.  Eyes:     General:        Right eye: No discharge.        Left eye: No discharge.  Cardiovascular:     Rate and Rhythm: Normal rate and regular rhythm.  Pulmonary:     Effort: Pulmonary effort is normal. No respiratory distress.  Musculoskeletal:        General: No deformity.  Skin:    General: Skin is warm and dry.     Comments: Redness, flaking, and mild soft tissue edema of facial tissue, flaking is especially worse around ears the rash does extend down the chest, seems to be limited in a U-shaped distribution almost like a short line, there  are some scattered red satellite areas of irritation inferior to the major red area on the chest.  No evidence of hives, no sloughing rash, the rash is tender to the touch similar to sunburn or heat rash.  Neurological:     Mental Status: He is alert and oriented to person, place, and time.  Psychiatric:        Mood and Affect: Mood normal.        Behavior: Behavior normal.     ED Results / Procedures / Treatments   Labs (all labs ordered are listed, but only abnormal results are displayed) Labs Reviewed - No data to display  EKG None  Radiology No results found.  Procedures Procedures    Medications Ordered in ED Medications  predniSONE  (DELTASONE ) tablet 60 mg (60 mg Oral Given 03/01/24 2335)    ED Course/ Medical Decision Making/ A&P                                 Medical Decision Making   This patient is a 68 y.o. male who presents to the ED for concern of rash.   Differential diagnoses prior to evaluation: Cellulitis, erysipelas, drug reaction, sunburn, heat rash, neomycin allergy, versus other, I have very low clinical suspicion based on the appearance of the rash of a drug reaction, SJS, TEN, or other severe or life-threatening rash.  Overall with high clinical suspicion of a combination of heat rash, seborrheic dermatitis, some possible sunburn, and possible neomycin allergy.  Past Medical History / Social History / Additional history: Chart reviewed. Pertinent results include: COPD, hyperlipidemia, EtOH abuse  Physical Exam: Physical exam performed. The pertinent findings include:  Redness, flaking, and mild soft tissue edema of facial tissue, flaking is especially worse around ears the rash does extend down the chest, seems to be limited in a U-shaped distribution almost like a short line, there are some scattered red satellite areas of irritation inferior to the major red area on the chest.  No evidence of hives, no sloughing rash, the rash is tender to the  touch similar to sunburn or heat rash.   Medications / Treatment: Discussed with patient this rash seems consistent with likely heat rash, seborrheic dermatitis, some possible sunburn, and possible neomycin allergy -- he has a lot of concern due to previous episode of erysipelas so I think reasonable to discharge with prescription for cefadroxil , prednisone  burst, and triamcinolone, encourage close return precautions, PCP/dermatology follow-up   Disposition: After consideration of the diagnostic results and the patients response to treatment, I feel that patient stable for discharge  with plan as above.   emergency department workup does not suggest an emergent condition requiring admission or immediate intervention beyond what has been performed at this time. The plan is: as above. The patient is safe for discharge and has been instructed to return immediately for worsening symptoms, change in symptoms or any other concerns.  Final Clinical Impression(s) / ED Diagnoses Final diagnoses:  Irritant contact dermatitis, unspecified trigger    Rx / DC Orders ED Discharge Orders          Ordered    predniSONE  (DELTASONE ) 20 MG tablet  Daily        03/01/24 2325    cefadroxil  (DURICEF) 500 MG capsule  2 times daily        03/01/24 2325    triamcinolone ointment (KENALOG) 0.5 %  2 times daily        03/01/24 2325              Stefan Edge 03/01/24 2340    Tegeler, Marine Sia, MD 03/01/24 [phone]

## 2024-03-01 NOTE — ED Triage Notes (Signed)
 Pt states that he was recently in [place] and developed redness to his face and anterior chest area. Appears to look like a sunburn. States he was in under a tent, but didn't think he got burned. States that treated him with some type of antibiotic there and he felt it helped a little.Denies any need medicines, but denies any new foods or products. States this is painful to his face and anterior chest area.

## 2024-03-02 ENCOUNTER — Telehealth: Payer: Self-pay

## 2024-03-02 NOTE — Transitions of Care (Post Inpatient/ED Visit) (Signed)
   03/02/2024  Name: Daniel [name] MRN: [id number] DOB: June 19, 1956  Today's TOC FU Call Status: Today's TOC FU Call Status:: Unsuccessful Call (1st Attempt) Unsuccessful Call (1st Attempt) Date: 03/02/24  Attempted to reach the patient regarding the most recent Inpatient/ED visit.  Follow Up Plan: Additional outreach attempts will be made to reach the patient to complete the Transitions of Care (Post Inpatient/ED visit) call.   Signature Darrall Ellison, LPN [facility] Nurse Health Advisor Direct Dial [phone]

## 2024-03-03 NOTE — Transitions of Care (Post Inpatient/ED Visit) (Signed)
 03/03/2024  Name: Daniel [name] MRN: [id number] DOB: 08/31/1956  Today's TOC FU Call Status: Today's TOC FU Call Status:: Successful TOC FU Call Completed Unsuccessful Call (1st Attempt) Date: 03/02/24 [facility] FU Call Complete Date: 03/03/24 Patient's Name and Date of Birth confirmed.  Transition Care Management Follow-up Telephone Call Date of Discharge: 03/01/24 Discharge Facility: [facility] ([facility]) Type of Discharge: Emergency Department Reason for ED Visit: Other: (rash) How have you been since you were released from the hospital?: Better Any questions or concerns?: No  Items Reviewed: Did you receive and understand the discharge instructions provided?: Yes Medications obtained,verified, and reconciled?: Yes (Medications Reviewed) Any new allergies since your discharge?: No Dietary orders reviewed?: Yes Do you have support at home?: Yes People in Home [RPT]: spouse  Medications Reviewed Today: Medications Reviewed Today     Reviewed by Darrall Ellison, LPN (Licensed Practical Nurse) on 03/03/24 at 1117  Med List Status: <None>   Medication Order Taking? Sig Documenting Provider Last Dose Status Informant  albuterol  (VENTOLIN  HFA) 108 (90 Base) MCG/ACT inhaler [id number] No Inhale 2 puffs into the lungs every 6 (six) hours as needed for wheezing or shortness of breath. [provider] Taking Active Self           Med Note Andee Kato, Lynford Sarin   Tue Nov 03, 2022  2:50 PM) prn  baclofen  (LIORESAL ) 10 MG tablet [id number] No Take 1 tablet (10 mg total) by mouth 3 (three) times daily. Roise Cleaver, MD Taking Active   cefadroxil  (DURICEF) 500 MG capsule [id number]  Take 1 capsule (500 mg total) by mouth 2 (two) times daily. Prosperi, Christian H, PA-C  Active   clopidogrel  (PLAVIX ) 75 MG tablet [id number] No Take 1 tablet (75 mg total) by mouth daily. TAKE 1 TABLET(75 MG) BY MOUTH Stacks, Vallorie Gayer, MD Taking Active   Fluticasone -Umeclidin-Vilant (TRELEGY ELLIPTA )  100-62.5-25 MCG/ACT AEPB [id number] No Inhale 1 puff into the lungs daily. Roise Cleaver, MD Taking Active   GLUCOSAMINE-CHONDROITIN PO [id number] No Take 2 tablets by mouth daily. With Vit D [provider] Taking Active Self  metoprolol  succinate (TOPROL -XL) 25 MG 24 hr tablet [id number] No Take 1 tablet (25 mg total) by mouth daily. Roise Cleaver, MD Taking Active   mometasone  (ELOCON ) 0.1 % cream [id number] No Apply 1 Application topically daily. To affected areas Roise Cleaver, MD Taking Active   pantoprazole  (PROTONIX ) 40 MG tablet [id number] No TAKE 1 TABLET(40 MG) BY MOUTH DAILY FOR STOMACH Stacks, Vallorie Gayer, MD Taking Active   predniSONE  (DELTASONE ) 20 MG tablet [id number]  Take 2 tablets (40 mg total) by mouth daily. Prosperi, Christian H, PA-C  Active   rosuvastatin  (CRESTOR ) 40 MG tablet [id number] No TAKE 1 TABLET(40 MG) BY MOUTH DAILY FOR CHOLESTEROL Roise Cleaver, MD Taking Active   SUPER B COMPLEX/C PO [id number] No Take 1 tablet by mouth daily. [provider] Taking Active Self  triamcinolone  ointment (KENALOG ) 0.5 % [id number]  Apply 1 Application topically 2 (two) times daily. Prosperi, Cosimo Diones, PA-C  Active             Home Care and Equipment/Supplies: Were Home Health Services Ordered?: NA Any new equipment or medical supplies ordered?: NA  Functional Questionnaire: Do you need assistance with bathing/showering or dressing?: No Do you need assistance with meal preparation?: No Do you need assistance with eating?: No Do you have difficulty maintaining continence: No Do you need assistance with getting out of bed/getting out of a chair/moving?: No Do you have difficulty managing or taking  your medications?: No  Follow up appointments reviewed: PCP Follow-up appointment confirmed?: No (declined) MD Provider Line Number:[phone] Given: No Specialist Hospital Follow-up appointment confirmed?: NA Do you need transportation to your follow-up  appointment?: No Do you understand care options if your condition(s) worsen?: Yes-patient verbalized understanding    SIGNATURE Darrall Ellison, LPN [facility] Nurse Health Advisor Direct Dial [phone]

## 2024-03-09 ENCOUNTER — Other Ambulatory Visit: Payer: Self-pay | Admitting: Family Medicine

## 2024-06-12 ENCOUNTER — Other Ambulatory Visit: Payer: Self-pay | Admitting: Family Medicine

## 2024-06-12 DIAGNOSIS — I10 Essential (primary) hypertension: Secondary | ICD-10-CM

## 2024-06-26 ENCOUNTER — Telehealth: Payer: Self-pay | Admitting: Family Medicine

## 2024-06-26 DIAGNOSIS — Z0000 Encounter for general adult medical examination without abnormal findings: Secondary | ICD-10-CM

## 2024-06-26 DIAGNOSIS — E785 Hyperlipidemia, unspecified: Secondary | ICD-10-CM

## 2024-06-26 DIAGNOSIS — I10 Essential (primary) hypertension: Secondary | ICD-10-CM

## 2024-06-26 NOTE — Telephone Encounter (Signed)
 Lab orders entered as requested. Please have patient schedule lab appointment.

## 2024-06-26 NOTE — Telephone Encounter (Signed)
 Called pt to schedule lab appt and phone got disconnected. Tried to call back several times and it went to [employer]

## 2024-06-26 NOTE — Telephone Encounter (Signed)
 Copied from CRM [id number]. Topic: Clinical - Request for Lab/Test Order >> Jun 26, 2024  8:03 AM Daniel [name] wrote: Reason for CRM: Patient would like to have his labs for his physical done prior to his physical. Is requesting orders to be placed.  Patient can be reached at [phone]

## 2024-06-28 ENCOUNTER — Ambulatory Visit (INDEPENDENT_AMBULATORY_CARE_PROVIDER_SITE_OTHER): Payer: Medicare HMO | Admitting: Family Medicine

## 2024-06-28 ENCOUNTER — Encounter: Payer: Self-pay | Admitting: Family Medicine

## 2024-06-28 VITALS — BP 102/66 | HR 60 | Temp 97.5°F | Ht 70.0 in | Wt 192.0 lb

## 2024-06-28 DIAGNOSIS — R5383 Other fatigue: Secondary | ICD-10-CM

## 2024-06-28 DIAGNOSIS — N400 Enlarged prostate without lower urinary tract symptoms: Secondary | ICD-10-CM | POA: Diagnosis not present

## 2024-06-28 DIAGNOSIS — Z0000 Encounter for general adult medical examination without abnormal findings: Secondary | ICD-10-CM

## 2024-06-28 DIAGNOSIS — J449 Chronic obstructive pulmonary disease, unspecified: Secondary | ICD-10-CM

## 2024-06-28 DIAGNOSIS — E785 Hyperlipidemia, unspecified: Secondary | ICD-10-CM | POA: Diagnosis not present

## 2024-06-28 DIAGNOSIS — E559 Vitamin D deficiency, unspecified: Secondary | ICD-10-CM

## 2024-06-28 DIAGNOSIS — I10 Essential (primary) hypertension: Secondary | ICD-10-CM

## 2024-06-28 DIAGNOSIS — Z0001 Encounter for general adult medical examination with abnormal findings: Secondary | ICD-10-CM | POA: Diagnosis not present

## 2024-06-28 NOTE — Progress Notes (Signed)
 Subjective:  Patient ID: Daniel [name], male    DOB: 08/07/1956  Age: 67 y.o. MRN: [id number]  CC: Annual Exam   HPI  Discussed the use of AI scribe software for clinical note transcription with the patient, who gave verbal consent to proceed.  History of Present Illness Daniel [name] is a 68 year old male who presents with shortness of breath when bending over.  He experiences shortness of breath primarily when bending over, which resolves after standing up and catching his breath. This has been a persistent issue, and he is not satisfied with his current breathing status. He uses a Trelegy inhaler daily but finds it insufficient for his symptoms.  He has a history of arthritis, particularly affecting his lower back and hands. He previously received a cortisone injection from his orthopedic specialist in [facility] for lower back pain, which was attributed to arthritis in his hip. He has had back surgery and a past fracture in the same area. Despite these interventions, he continues to experience discomfort.  He experiences dizziness upon standing, which he attributes to low blood pressure. He is on a low dose of Toprol  XL for blood pressure management and reports feeling 'zapped' and needing to be cautious when standing up. He is also drinking more water to manage this symptom.  He reports nocturia, waking up two to three times per night to urinate, but does not mind getting up as he is not a sound sleeper. He drinks water throughout the night and has not been taking any medication to address this issue.  He is currently taking rosuvastatin  for cholesterol management and clopidogrel  as a blood thinner. He mistakenly mentioned taking Duricef, but clarified he is not on this medication.          06/28/2024   10:08 AM 01/04/2024    2:44 PM 01/01/2023    9:50 AM  Depression screen PHQ 2/9  Decreased Interest 0 0 0  Down, Depressed, Hopeless 0 0 0  PHQ - 2 Score 0 0 0     History Daniel [name] has a past medical history of Acute ischemic stroke (HCC) (03/12/2018), Anesthesia complication, BPH (benign prostatic hyperplasia), COPD (chronic obstructive pulmonary disease) (HCC), Hyperlipidemia, Hypertension, Pulmonary nodule, renal stents, Stroke (HCC), and TIA (transient ischemic attack) (03/11/2018).   He has a past surgical history that includes renal stents; Cervical fusion; Rotator cuff repair (Left, 12/2021); Pterygium excision (2020); and Transforaminal lumbar interbody fusion (tlif) with pedicle screw fixation 1 level (Right, 09/09/2022).   His family history includes Cancer in his father; Emphysema in his father; Lung cancer in his mother.He reports that he has quit smoking. His smoking use included cigarettes. He has a 42 pack-year smoking history. He has never used smokeless tobacco. He reports current alcohol use of about 21.0 standard drinks of alcohol per week. He reports that he does not use drugs.    ROS Review of Systems  Constitutional:  Negative for activity change, fatigue, fever and unexpected weight change.  HENT:  Negative for congestion, ear pain, hearing loss, postnasal drip and trouble swallowing.   Eyes:  Negative for pain and visual disturbance.  Respiratory:  Negative for cough, chest tightness and shortness of breath.   Cardiovascular:  Negative for chest pain, palpitations and leg swelling.  Gastrointestinal:  Negative for abdominal distention, abdominal pain, blood in stool, constipation, diarrhea, nausea and vomiting.  Endocrine: Negative for cold intolerance, heat intolerance and polydipsia.  Genitourinary:  Negative for difficulty urinating, dysuria, flank pain, frequency  and urgency.  Musculoskeletal:  Negative for arthralgias and joint swelling.  Skin:  Negative for color change, rash and wound.  Neurological:  Negative for dizziness, syncope, speech difficulty, weakness, light-headedness, numbness and headaches.  Hematological:   Does not bruise/bleed easily.  Psychiatric/Behavioral:  Negative for confusion, decreased concentration, dysphoric mood and sleep disturbance. The patient is not nervous/anxious.     Objective:  BP 102/66   Pulse 60   Temp (!) 97.5 F (36.4 C)   Ht 5' 10 (1.778 m)   Wt 192 lb (87.1 kg)   SpO2 96%   BMI 27.55 kg/m   BP Readings from Last 3 Encounters:  06/28/24 102/66  03/01/24 135/70  01/04/24 128/70    Wt Readings from Last 3 Encounters:  06/28/24 192 lb (87.1 kg)  03/01/24 195 lb (88.5 kg)  01/04/24 196 lb (88.9 kg)     Physical Exam Vitals reviewed.  Constitutional:      Appearance: He is well-developed.  HENT:     Head: Normocephalic and atraumatic.     Right Ear: External ear normal.     Left Ear: External ear normal.     Mouth/Throat:     Pharynx: No oropharyngeal exudate or posterior oropharyngeal erythema.  Eyes:     Pupils: Pupils are equal, round, and reactive to light.  Neck:     Thyroid : No thyromegaly.     Trachea: No tracheal deviation.  Cardiovascular:     Rate and Rhythm: Normal rate and regular rhythm.     Heart sounds: Normal heart sounds. No murmur heard.    No friction rub. No gallop.  Pulmonary:     Effort: No respiratory distress.     Breath sounds: Normal breath sounds. No wheezing or rales.  Abdominal:     General: Bowel sounds are normal. There is no distension.     Palpations: Abdomen is soft. There is no mass.     Tenderness: There is no abdominal tenderness.     Hernia: There is no hernia in the left inguinal area.  Genitourinary:    Penis: Normal.      Testes: Normal.  Musculoskeletal:        General: Normal range of motion.     Cervical back: Normal range of motion and neck supple.  Lymphadenopathy:     Cervical: No cervical adenopathy.  Skin:    General: Skin is warm and dry.  Neurological:     Mental Status: He is alert and oriented to person, place, and time.      Assessment & Plan:  Well adult exam  Chronic  obstructive pulmonary disease, unspecified COPD type (HCC) -     CBC with Differential/Platelet -     CMP14+EGFR -     PSA, total and free -     Urinalysis -     Pulmonary Visit  Primary hypertension -     CBC with Differential/Platelet -     CMP14+EGFR -     PSA, total and free -     Urinalysis -     TSH  Benign prostatic hyperplasia, unspecified whether lower urinary tract symptoms present -     CBC with Differential/Platelet -     CMP14+EGFR -     PSA, total and free -     Urinalysis  Hyperlipidemia, unspecified hyperlipidemia type -     Lipid panel  Vitamin D  deficiency -     VITAMIN D  25 Hydroxy (Vit-D Deficiency, Fractures)  Fatigue, unspecified type -  TSH    Assessment & Plan Chronic obstructive pulmonary disease (COPD) with persistent dyspnea   Persistent dyspnea, especially when bending over, is likely due to increased intra-abdominal pressure affecting lung expansion. Current treatment with Trelegy is inadequate. Prescribe Breztri inhaler, two puffs in the morning and two in the evening. Arrange pulmonary function testing with a lung specialist in [place]. Advise not to use the inhaler on the morning of the test.  Hypertension   He reports feeling zapped and dizzy upon standing, likely due to low blood pressure. Currently on a low dose of Metoprolol  succinate (Toprol  XL). Reduce Metoprolol  succinate to half a tablet daily. Advise monitoring blood pressure daily and reporting findings.  Osteoarthritis of left hip and hands   Experiencing arthritis-related pain in the left hip and hands. Previous cortisone injection provided by an orthopedic specialist for hip pain. Recommend follow-up with the orthopedic specialist for hip pain management.  Benign prostatic hyperplasia with lower urinary tract symptoms   Experiencing nocturia, waking up two to three times per night. Prefers to manage symptoms without medication at this time. Advise reducing fluid intake two  hours before bedtime and emptying bladder before sleep. Offer to prescribe medication for BPH if symptoms worsen or if he decides to try medication.  Recording duration: 14 minutes       Follow-up: Return in about 6 months (around 12/29/2024), or if symptoms worsen or fail to improve.  Butler Der, M.D.

## 2024-06-30 ENCOUNTER — Other Ambulatory Visit

## 2024-06-30 DIAGNOSIS — J449 Chronic obstructive pulmonary disease, unspecified: Secondary | ICD-10-CM | POA: Diagnosis not present

## 2024-06-30 DIAGNOSIS — E785 Hyperlipidemia, unspecified: Secondary | ICD-10-CM | POA: Diagnosis not present

## 2024-06-30 DIAGNOSIS — R5383 Other fatigue: Secondary | ICD-10-CM | POA: Diagnosis not present

## 2024-06-30 DIAGNOSIS — E559 Vitamin D deficiency, unspecified: Secondary | ICD-10-CM | POA: Diagnosis not present

## 2024-06-30 DIAGNOSIS — N400 Enlarged prostate without lower urinary tract symptoms: Secondary | ICD-10-CM | POA: Diagnosis not present

## 2024-06-30 DIAGNOSIS — I10 Essential (primary) hypertension: Secondary | ICD-10-CM | POA: Diagnosis not present

## 2024-06-30 LAB — URINALYSIS
Bilirubin, UA: NEGATIVE
Glucose, UA: NEGATIVE
Leukocytes,UA: NEGATIVE
Nitrite, UA: NEGATIVE
RBC, UA: NEGATIVE
Specific Gravity, UA: >=1.03 — AB (ref 1.005–1.030)
Urobilinogen, Ur: 0.2 mg/dL (ref 0.2–1.0)
pH, UA: 5.5 (ref 5.0–7.5)

## 2024-06-30 LAB — LIPID PANEL

## 2024-07-01 LAB — CMP14+EGFR
ALT: 27 IU/L (ref 0–44)
AST: 40 IU/L (ref 0–40)
Albumin: 4.7 g/dL (ref 3.9–4.9)
Alkaline Phosphatase: 88 IU/L (ref 44–121)
BUN/Creatinine Ratio: 12 (ref 10–24)
BUN: 13 mg/dL (ref 8–27)
Bilirubin Total: 2.3 mg/dL — ABNORMAL HIGH (ref 0.0–1.2)
CO2: 19 mmol/L — ABNORMAL LOW (ref 20–29)
Calcium: 9.5 mg/dL (ref 8.6–10.2)
Chloride: 103 mmol/L (ref 96–106)
Creatinine, Ser: 1.07 mg/dL (ref 0.76–1.27)
Globulin, Total: 2.1 g/dL (ref 1.5–4.5)
Glucose: 95 mg/dL (ref 70–99)
Potassium: 4.1 mmol/L (ref 3.5–5.2)
Sodium: 138 mmol/L (ref 134–144)
Total Protein: 6.8 g/dL (ref 6.0–8.5)
eGFR: 76 mL/min/1.73 (ref >59)

## 2024-07-01 LAB — CBC WITH DIFFERENTIAL/PLATELET
Basophils Absolute: 0 x10E3/uL (ref 0.0–0.2)
Basos: 1 %
EOS (ABSOLUTE): 0.1 x10E3/uL (ref 0.0–0.4)
Eos: 2 %
Hematocrit: 46.1 % (ref 37.5–51.0)
Hemoglobin: 15.5 g/dL (ref 13.0–17.7)
Immature Grans (Abs): 0 x10E3/uL (ref 0.0–0.1)
Immature Granulocytes: 0 %
Lymphocytes Absolute: 1.3 x10E3/uL (ref 0.7–3.1)
Lymphs: 23 %
MCH: 33.6 pg — ABNORMAL HIGH (ref 26.6–33.0)
MCHC: 33.6 g/dL (ref 31.5–35.7)
MCV: 100 fL — ABNORMAL HIGH (ref 79–97)
Monocytes Absolute: 0.6 x10E3/uL (ref 0.1–0.9)
Monocytes: 11 %
Neutrophils Absolute: 3.4 x10E3/uL (ref 1.4–7.0)
Neutrophils: 63 %
Platelets: 170 x10E3/uL (ref 150–450)
RBC: 4.61 x10E6/uL (ref 4.14–5.80)
RDW: 12.8 % (ref 11.6–15.4)
WBC: 5.3 x10E3/uL (ref 3.4–10.8)

## 2024-07-01 LAB — PSA, TOTAL AND FREE
PSA, Free Pct: 30 %
PSA, Free: 0.09 ng/mL
Prostate Specific Ag, Serum: 0.3 ng/mL (ref 0.0–4.0)

## 2024-07-01 LAB — LIPID PANEL
Chol/HDL Ratio: 2.2 ratio (ref 0.0–5.0)
Cholesterol, Total: 124 mg/dL (ref 100–199)
HDL: 56 mg/dL (ref >39)
LDL Chol Calc (NIH): 53 mg/dL (ref 0–99)
Triglycerides: 73 mg/dL (ref 0–149)
VLDL Cholesterol Cal: 15 mg/dL (ref 5–40)

## 2024-07-01 LAB — VITAMIN D 25 HYDROXY (VIT D DEFICIENCY, FRACTURES): Vit D, 25-Hydroxy: 51.8 ng/mL (ref 30.0–100.0)

## 2024-07-01 LAB — TSH: TSH: 3.21 u[IU]/mL (ref 0.450–4.500)

## 2024-07-10 ENCOUNTER — Ambulatory Visit: Payer: Self-pay | Admitting: Family Medicine

## 2024-07-10 DIAGNOSIS — R17 Unspecified jaundice: Secondary | ICD-10-CM

## 2024-08-04 ENCOUNTER — Encounter (HOSPITAL_BASED_OUTPATIENT_CLINIC_OR_DEPARTMENT_OTHER): Payer: Self-pay | Admitting: Pulmonary Disease

## 2024-08-04 ENCOUNTER — Ambulatory Visit (HOSPITAL_BASED_OUTPATIENT_CLINIC_OR_DEPARTMENT_OTHER): Admitting: Pulmonary Disease

## 2024-08-04 VITALS — BP 101/64 | HR 61 | Ht 70.0 in | Wt 192.0 lb

## 2024-08-04 DIAGNOSIS — F17210 Nicotine dependence, cigarettes, uncomplicated: Secondary | ICD-10-CM

## 2024-08-04 DIAGNOSIS — Z720 Tobacco use: Secondary | ICD-10-CM

## 2024-08-04 DIAGNOSIS — R0602 Shortness of breath: Secondary | ICD-10-CM

## 2024-08-04 MED ORDER — ALBUTEROL SULFATE HFA 108 (90 BASE) MCG/ACT IN AERS: 1.0000 | INHALATION_SPRAY | RESPIRATORY_TRACT | 2 refills | Status: AC | PRN

## 2024-08-04 MED ORDER — TRELEGY ELLIPTA 200-62.5-25 MCG/ACT IN AEPB: 1.0000 | INHALATION_SPRAY | Freq: Every day | RESPIRATORY_TRACT | 3 refills | Status: AC

## 2024-08-04 NOTE — Patient Instructions (Signed)
 COPD --INCREASE Trelegy 200 ONE puff ONCE a day --CONTINUE Albuterol  TWO puffs AS NEEDED for shortness of breath or wheezing --ORDER pulmonary function test before next visit

## 2024-08-04 NOTE — Progress Notes (Signed)
 Subjective:   PATIENT ID: Daniel [name] GENDER: male DOB: [birth year], MRN: [id number]  Chief Complaint  Patient presents with   Establish Care    Reason for Visit: New consult for COPD  [name] 68 year old former smoker with COPD, childhood asthma, hx multiple pulmonary nodules, hx TIA, HTN, hx renal artery stenosis s/p bilateral stents, HLD, BPH, hx alcohol use who presents as new consult for COPD.  He was diagnosed with COPD 10 years ago. He has long standing history of shortness of breath. Recently worsened with climbing up ladders, bending over. If he overexerts himself or heat will worsen his symptoms. Walking on level ground is not an issue. Thinks he can walk a mile without symptoms. Rest improves his symptoms. Currently on Trelegy 100. Rarely uses albuterol  inhaler but feels he probably should  Social History: Former smoker. Quit in 2019. 2 ppd x 50 years. 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   BPH (benign prostatic hyperplasia)    COPD (chronic obstructive pulmonary disease) (HCC)    Hyperlipidemia    Hypertension    Pulmonary nodule    renal stents    Stroke [name])    TIA (transient ischemic attack) 03/11/2018     Family History  Problem Relation Age of Onset   Emphysema Father    Cancer Father    Lung cancer Mother      Social History   Occupational History   Occupation: Proofreader  Tobacco Use   Smoking status: Former    Current packs/day: 1.00    Average packs/day: 1 pack/day for 42.0 years (42.0 ttl  pk-yrs)    Types: Cigarettes   Smokeless tobacco: Never  Vaping Use   Vaping status: Never Used  Substance and Sexual Activity   Alcohol use: Yes    Alcohol/week: 21.0 standard drinks of alcohol    Types: 21 Shots of liquor per week    Comment: 3-3oz shots of bourbon a day   Drug use: Never   Sexual activity: Not on file    No Active Allergies   Outpatient Medications Prior to Visit  Medication Sig Dispense Refill   baclofen  (LIORESAL ) 10 MG tablet Take 1 tablet (10 mg total) by mouth 3 (three) times daily. 270 each 3   clopidogrel  (PLAVIX ) 75 MG tablet Take 1 tablet (75 mg total) by mouth daily. TAKE 1 TABLET(75 MG) BY MOUTH 90 tablet 3   GLUCOSAMINE-CHONDROITIN PO Take 2 tablets by mouth daily. With Vit D     metoprolol  succinate (TOPROL -XL) 25 MG 24 hr tablet TAKE 1 TABLET(25 MG) BY MOUTH DAILY 90 tablet 0   mometasone  (ELOCON ) 0.1 % cream Apply 1 Application topically daily. To affected areas 45 g 5   pantoprazole  (PROTONIX ) 40 MG tablet TAKE 1 TABLET(40 MG) BY MOUTH DAILY FOR STOMACH 90 tablet 3   rosuvastatin  (CRESTOR ) 40 MG tablet TAKE 1 TABLET(40 MG) BY MOUTH DAILY FOR CHOLESTEROL 90 tablet 3   SUPER B COMPLEX/C PO Take 1 tablet by mouth daily.     albuterol  (VENTOLIN  HFA) 108 (90 Base) MCG/ACT inhaler Inhale 2 puffs  into the lungs every 6 (six) hours as needed for wheezing or shortness of breath.     Fluticasone -Umeclidin-Vilant (TRELEGY ELLIPTA ) 100-62.5-25 MCG/ACT AEPB INHALE 1 PUFF INTO THE LUNGS DAILY 180 each 0   No facility-administered medications prior to visit.    Review of Systems  Constitutional:  Negative for chills, diaphoresis, fever, malaise/fatigue and weight loss.  HENT:  Negative for congestion.   Respiratory:  Positive for shortness of breath. Negative for cough, hemoptysis, sputum production and wheezing.   Cardiovascular:  Negative for chest pain, palpitations and leg swelling.     Objective:   Vitals:   08/04/24 0910  BP: 101/64  Pulse: 61   SpO2: 97%  Weight: 192 lb (87.1 kg)  Height: 5' 10 (1.778 m)   SpO2: 97 %  Physical Exam: General: Well-appearing, no acute distress HENT: [place], AT Eyes: EOMI, no scleral icterus Respiratory: Clear to auscultation bilaterally.  No crackles, wheezing or rales Cardiovascular: RRR, -M/R/G, no JVD Extremities:-Edema,-tenderness Neuro: AAO x4, CNII-XII grossly intact Psych: Normal mood, normal affect  Data Reviewed:  Imaging: CTA Chest - Left basilar atelectasis. No PE CXR 01/01/22 - No active cardiopulmonary disease  PFT: None on file  Labs: CBC    Component Value Date/Time   WBC 5.3 06/30/2024 0908   WBC 6.3 11/04/2022 0559   RBC 4.61 06/30/2024 0908   RBC 4.05 (L) 11/04/2022 0559   HGB 15.5 06/30/2024 0908   HCT 46.1 06/30/2024 0908   PLT 170 06/30/2024 0908   MCV 100 (H) 06/30/2024 0908   MCH 33.6 (H) 06/30/2024 0908   MCH 32.3 11/04/2022 0559   MCHC 33.6 06/30/2024 0908   MCHC 33.2 11/04/2022 0559   RDW 12.8 06/30/2024 0908   LYMPHSABS 1.3 06/30/2024 0908   MONOABS 0.6 11/03/2022 1015   EOSABS 0.1 06/30/2024 0908   BASOSABS 0.0 06/30/2024 0908   Absolutes eos 100     Assessment & Plan:   Discussion: 68 year old former smoker with COPD, childhood asthma, hx multiple pulmonary nodules, hx TIA, HTN, hx renal artery stenosis s/p bilateral stents, HLD, BPH, hx alcohol use who presents as new consult for COPD. Worsening symptoms that may benefit from increase triple therapy. Discussed clinical course and management of COPD/asthma including bronchodilator regimen, preventive care including vaccinations and action plan for exacerbation.   COPD --INCREASE Trelegy 200 ONE puff ONCE a day --CONTINUE Albuterol  TWO puffs AS NEEDED for shortness of breath or wheezing --ORDER pulmonary function test before next visit  Health Maintenance Immunization History  Administered Date(s) Administered   Influenza Inj Mdck Quad Pf 07/28/2019, 07/28/2019   Influenza,inj,Quad  PF,6+ Mos 09/25/2015, 08/06/2016, 11/18/2017, 08/09/2018, 08/13/2020   Influenza-Unspecified 09/28/2023   PFIZER Comirnaty(Gray Top)Covid-19 Tri-Sucrose Vaccine 03/11/2021   PFIZER(Purple Top)SARS-COV-2 Vaccination 08/13/2020   Pfizer Covid-19 Vaccine Bivalent Booster 76yrs & up 09/28/2023   Pneumococcal Conjugate-13 07/31/2019   Pneumococcal Polysaccharide-23 07/08/2021   Tdap 12/31/2020   Zoster Recombinant(Shingrix) 03/11/2021, 07/13/2021   CT Lung Screen  - enrolled  Orders Placed This Encounter  Procedures   DG Chest 2 View    Standing Status:   Future    Expiration Date:   08/04/2025    Reason for Exam (SYMPTOM  OR DIAGNOSIS REQUIRED):   shortness of breath    Preferred imaging location?:   [facility]   Pulmonary function test    Standing Status:   Future    Expiration Date:   08/04/2025    Where should this test be performed?:   Outpatient  Pulmonary    What type of PFT is being ordered?:   Full PFT   Meds ordered this encounter  Medications   Fluticasone -Umeclidin-Vilant (TRELEGY ELLIPTA ) 200-62.5-25 MCG/ACT AEPB    Sig: Inhale 1 puff into the lungs daily at 2 PM.    Dispense:  180 each    Refill:  3   albuterol  (VENTOLIN  HFA) 108 (90 Base) MCG/ACT inhaler    Sig: Inhale 1-2 puffs into the lungs every 4 (four) hours as needed for wheezing or shortness of breath.    Dispense:  25.5 each    Refill:  2    Return in about 3 months (around 11/04/2024) for after PFT.  I have spent a total time of 45-minutes on the day of the appointment reviewing prior documentation, coordinating care and discussing medical diagnosis and plan with the patient/family. Imaging, labs and tests included in this note have been reviewed and interpreted independently by me.  Adonai Helzer Slater Staff, MD [facility] 08/04/2024 9:39 AM

## 2024-08-11 ENCOUNTER — Telehealth: Payer: Self-pay

## 2024-08-11 DIAGNOSIS — Z87891 Personal history of nicotine dependence: Secondary | ICD-10-CM

## 2024-08-11 DIAGNOSIS — Z122 Encounter for screening for malignant neoplasm of respiratory organs: Secondary | ICD-10-CM

## 2024-08-11 DIAGNOSIS — F17210 Nicotine dependence, cigarettes, uncomplicated: Secondary | ICD-10-CM

## 2024-08-11 NOTE — Telephone Encounter (Signed)
 Lung Cancer Screening Narrative/Criteria Questionnaire (Cigarette Smokers Only- No Cigars/Pipes/vapes)   Daniel [name]   SDMV:08/18/2024 at 11:15 [name]        June 28, 1956                           LDCT: 08/18/2024 at 6:30 pm AP    68 y.o.   Phone: [phone]  Lung Screening Narrative (confirm age 36-77 yrs Medicare / 50-80 yrs Private pay insurance)   Insurance information: [employer]   Referring Provider:  Kassie   This screening involves an initial phone call with a team member from our program. It is called a shared decision making visit. The initial meeting is required by  insurance and Medicare to make sure you understand the program. This appointment takes about 15-20 minutes to complete. You will complete the screening scan at your scheduled date/time.  This scan takes about 5-10 minutes to complete. You can eat and drink normally before and after the scan.  Criteria questions for Lung Cancer Screening:   Are you a current or former smoker? Former Age began smoking: 14   If you are a former smoker, what year did you quit smoking? Quit 2019 and 2 additional years (within 15 yrs)   To calculate your smoking history, I need an accurate estimate of how many packs of cigarettes you smoked per day and for how many years. (Not just the number of PPD you are now smoking)   Years smoking 46 x Packs per day 2.5 = Pack years 115   (at least 20 pack yrs)   (Make sure they understand that we need to know how much they have smoked in the past, not just the number of PPD they are smoking now)  Do you have a personal history of cancer?  No    Do you have a family history of cancer? Yes  (cancer type and and relative) Father had bladder and prostate cancer. Mother had lung cancer.   Are you coughing up blood?  No  Have you had unexplained weight loss of 15 lbs or more in the last 6 months? No  It looks like you meet all criteria.  When would be a good time for us  to schedule you  for this screening?   Additional information: N/A

## 2024-08-16 ENCOUNTER — Other Ambulatory Visit

## 2024-08-16 DIAGNOSIS — F17210 Nicotine dependence, cigarettes, uncomplicated: Secondary | ICD-10-CM

## 2024-08-16 DIAGNOSIS — E785 Hyperlipidemia, unspecified: Secondary | ICD-10-CM | POA: Diagnosis not present

## 2024-08-16 DIAGNOSIS — I10 Essential (primary) hypertension: Secondary | ICD-10-CM | POA: Diagnosis not present

## 2024-08-16 DIAGNOSIS — Z0000 Encounter for general adult medical examination without abnormal findings: Secondary | ICD-10-CM | POA: Diagnosis not present

## 2024-08-16 DIAGNOSIS — Z87891 Personal history of nicotine dependence: Secondary | ICD-10-CM

## 2024-08-16 DIAGNOSIS — Z122 Encounter for screening for malignant neoplasm of respiratory organs: Secondary | ICD-10-CM

## 2024-08-16 DIAGNOSIS — R17 Unspecified jaundice: Secondary | ICD-10-CM | POA: Diagnosis not present

## 2024-08-16 LAB — CMP14+EGFR
ALT: 24 IU/L (ref 0–44)
AST: 34 IU/L (ref 0–40)
Albumin: 4.5 g/dL (ref 3.9–4.9)
Alkaline Phosphatase: 74 IU/L (ref 47–123)
BUN/Creatinine Ratio: 8 — ABNORMAL LOW (ref 10–24)
BUN: 10 mg/dL (ref 8–27)
Bilirubin Total: 1.3 mg/dL — ABNORMAL HIGH (ref 0.0–1.2)
CO2: 22 mmol/L (ref 20–29)
Calcium: 9.2 mg/dL (ref 8.6–10.2)
Chloride: 105 mmol/L (ref 96–106)
Creatinine, Ser: 1.2 mg/dL (ref 0.76–1.27)
Globulin, Total: 2 g/dL (ref 1.5–4.5)
Glucose: 106 mg/dL — ABNORMAL HIGH (ref 70–99)
Potassium: 4.5 mmol/L (ref 3.5–5.2)
Sodium: 142 mmol/L (ref 134–144)
Total Protein: 6.5 g/dL (ref 6.0–8.5)
eGFR: 66 mL/min/1.73 (ref >59)

## 2024-08-16 LAB — CBC WITH DIFFERENTIAL/PLATELET
Basophils Absolute: 0.1 x10E3/uL (ref 0.0–0.2)
Basos: 1 %
EOS (ABSOLUTE): 0.1 x10E3/uL (ref 0.0–0.4)
Eos: 2 %
Hematocrit: 45.7 % (ref 37.5–51.0)
Hemoglobin: 15.2 g/dL (ref 13.0–17.7)
Immature Grans (Abs): 0 x10E3/uL (ref 0.0–0.1)
Immature Granulocytes: 0 %
Lymphocytes Absolute: 1.4 x10E3/uL (ref 0.7–3.1)
Lymphs: 24 %
MCH: 33.2 pg — ABNORMAL HIGH (ref 26.6–33.0)
MCHC: 33.3 g/dL (ref 31.5–35.7)
MCV: 100 fL — ABNORMAL HIGH (ref 79–97)
Monocytes Absolute: 0.6 x10E3/uL (ref 0.1–0.9)
Monocytes: 10 %
Neutrophils Absolute: 3.6 x10E3/uL (ref 1.4–7.0)
Neutrophils: 63 %
Platelets: 153 x10E3/uL (ref 150–450)
RBC: 4.58 x10E6/uL (ref 4.14–5.80)
RDW: 12.7 % (ref 11.6–15.4)
WBC: 5.8 x10E3/uL (ref 3.4–10.8)

## 2024-08-16 LAB — LIPID PANEL
Chol/HDL Ratio: 2.1 ratio (ref 0.0–5.0)
Cholesterol, Total: 126 mg/dL (ref 100–199)
HDL: 61 mg/dL (ref >39)
LDL Chol Calc (NIH): 53 mg/dL (ref 0–99)
Triglycerides: 53 mg/dL (ref 0–149)
VLDL Cholesterol Cal: 12 mg/dL (ref 5–40)

## 2024-08-16 LAB — URINALYSIS
Bilirubin, UA: NEGATIVE
Glucose, UA: NEGATIVE
Leukocytes,UA: NEGATIVE
Nitrite, UA: NEGATIVE
RBC, UA: NEGATIVE
Specific Gravity, UA: 1.025 (ref 1.005–1.030)
Urobilinogen, Ur: 0.2 mg/dL (ref 0.2–1.0)
pH, UA: 5.5 (ref 5.0–7.5)

## 2024-08-18 ENCOUNTER — Encounter: Payer: Self-pay | Admitting: Adult Health

## 2024-08-18 ENCOUNTER — Ambulatory Visit (HOSPITAL_COMMUNITY)
Admission: RE | Admit: 2024-08-18 | Discharge: 2024-08-18 | Disposition: A | Discharge status: Home / Self Care | Source: Ambulatory Visit | Attending: Family Medicine | Admitting: Family Medicine

## 2024-08-18 ENCOUNTER — Ambulatory Visit: Admitting: Adult Health

## 2024-08-18 DIAGNOSIS — F17210 Nicotine dependence, cigarettes, uncomplicated: Secondary | ICD-10-CM | POA: Diagnosis present

## 2024-08-18 DIAGNOSIS — Z87891 Personal history of nicotine dependence: Secondary | ICD-10-CM

## 2024-08-18 DIAGNOSIS — Z122 Encounter for screening for malignant neoplasm of respiratory organs: Secondary | ICD-10-CM | POA: Insufficient documentation

## 2024-08-18 NOTE — Progress Notes (Signed)
  Virtual Visit via Telephone Note  I connected with Daniel [name] , 08/18/24 11:16 AM by a telemedicine application and verified that I am speaking with the correct person using two identifiers.  Location: Patient: home Provider: home   I discussed the limitations of evaluation and management by telemedicine and the availability of in person appointments. The patient expressed understanding and agreed to proceed.   Shared Decision Making Visit Lung Cancer Screening Program [phone])   Eligibility: 69 y.o. Pack Years Smoking History Calculation = 115 pack years  (# packs/per year x # years smoked) Recent History of coughing up blood  no Unexplained weight loss? no ( >Than 15 pounds within the last 6 months ) Prior History Lung / other cancer no (Diagnosis within the last 5 years already requiring surveillance chest CT Scans). Smoking Status Former Smoker Former Smokers: Years since quit: 6 years  Quit Date: 2019  Visit Components: Discussion included one or more decision making aids. YES Discussion included risk/benefits of screening. YES Discussion included potential follow up diagnostic testing for abnormal scans. YES Discussion included meaning and risk of over diagnosis. YES Discussion included meaning and risk of False Positives. YES Discussion included meaning of total radiation exposure. YES  Counseling Included: Importance of adherence to annual lung cancer LDCT screening. YES Impact of comorbidities on ability to participate in the program. YES Ability and willingness to under diagnostic treatment. YES  Smoking Cessation Counseling: Former Smokers:  Discussed the importance of maintaining cigarette abstinence. yes Diagnosis Code: Personal History of Nicotine Dependence. S12.108 Information about tobacco cessation classes and interventions provided to patient. Yes Patient provided with ticket for LDCT Scan. yes Written Order for Lung Cancer Screening with  LDCT placed in Epic. Yes (CT Chest Lung Cancer Screening Low Dose W/O CM) [id number]  Z12.2-Screening of respiratory organs Z87.891-Personal history of nicotine dependence   [name] 08/18/24

## 2024-08-18 NOTE — Patient Instructions (Signed)

## 2024-08-23 ENCOUNTER — Other Ambulatory Visit: Payer: Self-pay

## 2024-08-23 DIAGNOSIS — Z87891 Personal history of nicotine dependence: Secondary | ICD-10-CM

## 2024-08-23 DIAGNOSIS — Z122 Encounter for screening for malignant neoplasm of respiratory organs: Secondary | ICD-10-CM

## 2024-10-12 ENCOUNTER — Other Ambulatory Visit: Payer: Self-pay | Admitting: Family Medicine

## 2025-01-01 ENCOUNTER — Ambulatory Visit: Payer: Self-pay | Admitting: Family Medicine

## 2025-01-04 ENCOUNTER — Ambulatory Visit: Payer: Self-pay

## 2025-01-09 ENCOUNTER — Ambulatory Visit
